# Patient Record
Sex: Female | Born: 1990 | Race: White | Hispanic: No | Marital: Single | Smoking: Former smoker
Health system: Southern US, Community
[De-identification: ages and names within clinical notes are randomized; demographics above are authoritative.]

## PROBLEM LIST (undated history)

## (undated) DIAGNOSIS — J302 Other seasonal allergic rhinitis: Secondary | ICD-10-CM

## (undated) DIAGNOSIS — F32A Depression, unspecified: Secondary | ICD-10-CM

## (undated) DIAGNOSIS — E282 Polycystic ovarian syndrome: Secondary | ICD-10-CM

## (undated) DIAGNOSIS — T8859XA Other complications of anesthesia, initial encounter: Secondary | ICD-10-CM

## (undated) DIAGNOSIS — R519 Headache, unspecified: Secondary | ICD-10-CM

## (undated) DIAGNOSIS — J45909 Unspecified asthma, uncomplicated: Secondary | ICD-10-CM

## (undated) DIAGNOSIS — M797 Fibromyalgia: Secondary | ICD-10-CM

## (undated) DIAGNOSIS — N809 Endometriosis, unspecified: Secondary | ICD-10-CM

## (undated) DIAGNOSIS — F329 Major depressive disorder, single episode, unspecified: Secondary | ICD-10-CM

## (undated) DIAGNOSIS — J3089 Other allergic rhinitis: Secondary | ICD-10-CM

## (undated) DIAGNOSIS — R112 Nausea with vomiting, unspecified: Secondary | ICD-10-CM

## (undated) DIAGNOSIS — Z9889 Other specified postprocedural states: Secondary | ICD-10-CM

## (undated) DIAGNOSIS — F419 Anxiety disorder, unspecified: Secondary | ICD-10-CM

## (undated) DIAGNOSIS — O139 Gestational [pregnancy-induced] hypertension without significant proteinuria, unspecified trimester: Secondary | ICD-10-CM

## (undated) HISTORY — DX: Gestational (pregnancy-induced) hypertension without significant proteinuria, unspecified trimester: O13.9

## (undated) HISTORY — PX: TONSILLECTOMY: SUR1361

## (undated) HISTORY — DX: Other seasonal allergic rhinitis: J30.2

## (undated) HISTORY — DX: Endometriosis, unspecified: N80.9

## (undated) HISTORY — DX: Polycystic ovarian syndrome: E28.2

## (undated) HISTORY — PX: TONSILLECTOMY AND ADENOIDECTOMY: SHX28

---
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
## 2012-02-19 DEATH — deceased

## 2015-10-01 ENCOUNTER — Emergency Department
Admission: EM | Admit: 2015-10-01 | Discharge: 2015-10-01 | Disposition: A | Discharge status: Home / Self Care | Payer: Self-pay | Source: Home / Self Care | Attending: Family Medicine | Admitting: Family Medicine

## 2015-10-01 ENCOUNTER — Encounter: Payer: Self-pay | Admitting: *Deleted

## 2015-10-01 DIAGNOSIS — B9789 Other viral agents as the cause of diseases classified elsewhere: Principal | ICD-10-CM

## 2015-10-01 DIAGNOSIS — J9801 Acute bronchospasm: Secondary | ICD-10-CM

## 2015-10-01 DIAGNOSIS — J069 Acute upper respiratory infection, unspecified: Secondary | ICD-10-CM

## 2015-10-01 HISTORY — DX: Unspecified asthma, uncomplicated: J45.909

## 2015-10-01 MED ORDER — PREDNISONE 20 MG PO TABS: 20.0000 mg | ORAL_TABLET | Freq: Two times a day (BID) | ORAL | Status: DC

## 2015-10-01 MED ORDER — AZITHROMYCIN 250 MG PO TABS: ORAL_TABLET | ORAL | Status: DC

## 2015-10-01 MED ORDER — BENZONATATE 200 MG PO CAPS: 200.0000 mg | ORAL_CAPSULE | Freq: Every day | ORAL | Status: DC

## 2015-10-01 NOTE — ED Notes (Signed)
Pt c/o 9 days of productive cough and congestion. Fever yesterday and day before. N/V last night.

## 2015-10-01 NOTE — ED Provider Notes (Signed)
CSN: [id number]     Arrival date & time 10/01/15  1144 History   First MD Initiated Contact with Patient 10/01/15 1215     Chief Complaint  Patient presents with  . Cough      HPI Comments: About nine days ago patient developed typical cold-like symptoms including mild sore throat, sinus congestion, headache, fatigue, and cough.  Her cough is generally non-productive, and worse at night.  She has occasional mild wheezing.  During the past two days she has had low grade fever/chills/sweats.  She has a history of exercise asthma.  She smokes.  The history is provided by the patient.    Past Medical History  Diagnosis Date  . Asthma    Past Surgical History  Procedure Laterality Date  . Tonsillectomy and adenoidectomy     Family History  Problem Relation Age of Onset  . Thyroid disease Mother   . Hyperlipidemia Mother   . Hyperlipidemia Father   . COPD Father    Social History  Substance Use Topics  . Smoking status: Current Every Day Smoker -- 0.25 packs/day for 2 years    Types: Cigarettes  . Smokeless tobacco: Never Used  . Alcohol Use: Yes   OB History    No data available     Review of Systems + sore throat + cough No pleuritic pain ? wheezing + nasal congestion + post-nasal drainage + sinus pain/pressure No itchy/red eyes ? earache No hemoptysis No SOB ? fever, + chills + nausea, resolved + vomiting, resolved No abdominal pain No diarrhea No urinary symptoms No skin rash + fatigue + myalgias + headache Used OTC meds without relief  Allergies  Latex  Home Medications   Prior to Admission medications   Medication Sig Start Date End Date Taking? Authorizing Provider  azithromycin (ZITHROMAX Z-PAK) 250 MG tablet Take 2 tabs today; then begin one tab once daily for 4 more days. 10/01/15   Lattie Haw, MD  benzonatate (TESSALON) 200 MG capsule Take 1 capsule (200 mg total) by mouth at bedtime. Take as needed for cough 10/01/15   Lattie Haw,  MD  predniSONE (DELTASONE) 20 MG tablet Take 1 tablet (20 mg total) by mouth 2 (two) times daily. Take with food. 10/01/15   Lattie Haw, MD   Meds Ordered and Administered this Visit  Medications - No data to display  BP 127/84 mmHg  Pulse 92  Temp(Src) 97.9 F (36.6 C) (Oral)  Resp 16  Ht  (1.778 m)  Wt 159 lb (72.122 kg)  BMI 22.81 kg/m2  SpO2 100%  LMP 09/14/2015 No data found.   Physical Exam Nursing notes and Vital Signs reviewed. Appearance:  Patient appears stated age, and in no acute distress Eyes:  Pupils are equal, round, and reactive to light and accomodation.  Extraocular movement is intact.  Conjunctivae are not inflamed  Ears:  Right canal occluded with cerumen.  Left canal partly occluded with cerumen.  Left tympanic membrane appears normal. Nose:   Congested turbinates.   Maxillary sinus tenderness is present.  Pharynx:  Normal Neck:  Supple.   Tender enlarged posterior nodes are palpated bilaterally  Lungs:  Clear to auscultation.  Breath sounds are equal.  Moving air well. Chest:  Distinct tenderness to palpation over the mid-sternum.  Heart:  Regular rate and rhythm without murmurs, rubs, or gallops.  Abdomen:  Nontender without masses or hepatosplenomegaly.  Bowel sounds are present.  No CVA or flank tenderness.  Extremities:  No edema.  No calf tenderness Skin:  No rash present.    ED Course  Procedures  none   MDM   1. Viral URI with cough   2. Bronchospasm    Begin prednisone burst, and Z-pack for atypical coverage.  Prescription written for Benzonatate [facility](Tessalon) to take at bedtime for night-time cough.  Take plain guaifenesin (1200mg  extended release tabs such as Mucinex) twice daily, with plenty of water, for cough and congestion.  May add Pseudoephedrine (30mg , one or two every 4 to 6 hours) for sinus congestion.  Get adequate rest.   May use Afrin nasal spray (or generic oxymetazoline) twice daily for about 5 days and then  discontinue.  Also recommend using saline nasal spray several times daily and saline nasal irrigation (AYR is a common brand).  Try warm salt water gargles for sore throat.  Stop all antihistamines for now, and other non-prescription cough/cold preparations.  Follow-up with family doctor if not improving about 7 to10 days.     Lattie HawStephen A Beese, MD 10/01/15 [phone]

## 2015-10-01 NOTE — Discharge Instructions (Signed)
Take plain guaifenesin (1200mg extended release tabs such as Mucinex) twice daily, with plenty of water, for cough and congestion.  May add Pseudoephedrine (30mg, one or two every 4 to 6 hours) for sinus congestion.  Get adequate rest.   °May use Afrin nasal spray (or generic oxymetazoline) twice daily for about 5 days and then discontinue.  Also recommend using saline nasal spray several times daily and saline nasal irrigation (AYR is a common brand).   °Try warm salt water gargles for sore throat.  °Stop all antihistamines for now, and other non-prescription cough/cold preparations. °Follow-up with family doctor if not improving about 7 to10 days.  °

## 2015-10-29 ENCOUNTER — Emergency Department
Admission: EM | Admit: 2015-10-29 | Discharge: 2015-10-29 | Disposition: A | Discharge status: Home / Self Care | Payer: Self-pay | Source: Home / Self Care | Attending: Family Medicine | Admitting: Family Medicine

## 2015-10-29 ENCOUNTER — Emergency Department (INDEPENDENT_AMBULATORY_CARE_PROVIDER_SITE_OTHER): Payer: Self-pay

## 2015-10-29 DIAGNOSIS — R112 Nausea with vomiting, unspecified: Secondary | ICD-10-CM

## 2015-10-29 DIAGNOSIS — R05 Cough: Secondary | ICD-10-CM

## 2015-10-29 DIAGNOSIS — J069 Acute upper respiratory infection, unspecified: Secondary | ICD-10-CM

## 2015-10-29 DIAGNOSIS — J9801 Acute bronchospasm: Secondary | ICD-10-CM

## 2015-10-29 DIAGNOSIS — J029 Acute pharyngitis, unspecified: Secondary | ICD-10-CM

## 2015-10-29 DIAGNOSIS — B9789 Other viral agents as the cause of diseases classified elsewhere: Principal | ICD-10-CM

## 2015-10-29 LAB — POCT CBC W AUTO DIFF (K'VILLE URGENT CARE)

## 2015-10-29 LAB — POCT RAPID STREP A (OFFICE): RAPID STREP A SCREEN: NEGATIVE

## 2015-10-29 LAB — POCT URINE PREGNANCY: Preg Test, Ur: NEGATIVE

## 2015-10-29 IMAGING — CR DG CHEST 2V
2 series · 2 of 2 positions shown · non-contrast
Comparison: No priors.

CLINICAL DATA: 24-year-old female with 4 day history of cough, sore
throat, chills and chest tightness. History of asthma. Smoker.

EXAM:
CHEST  2 VIEW

[chest pa]
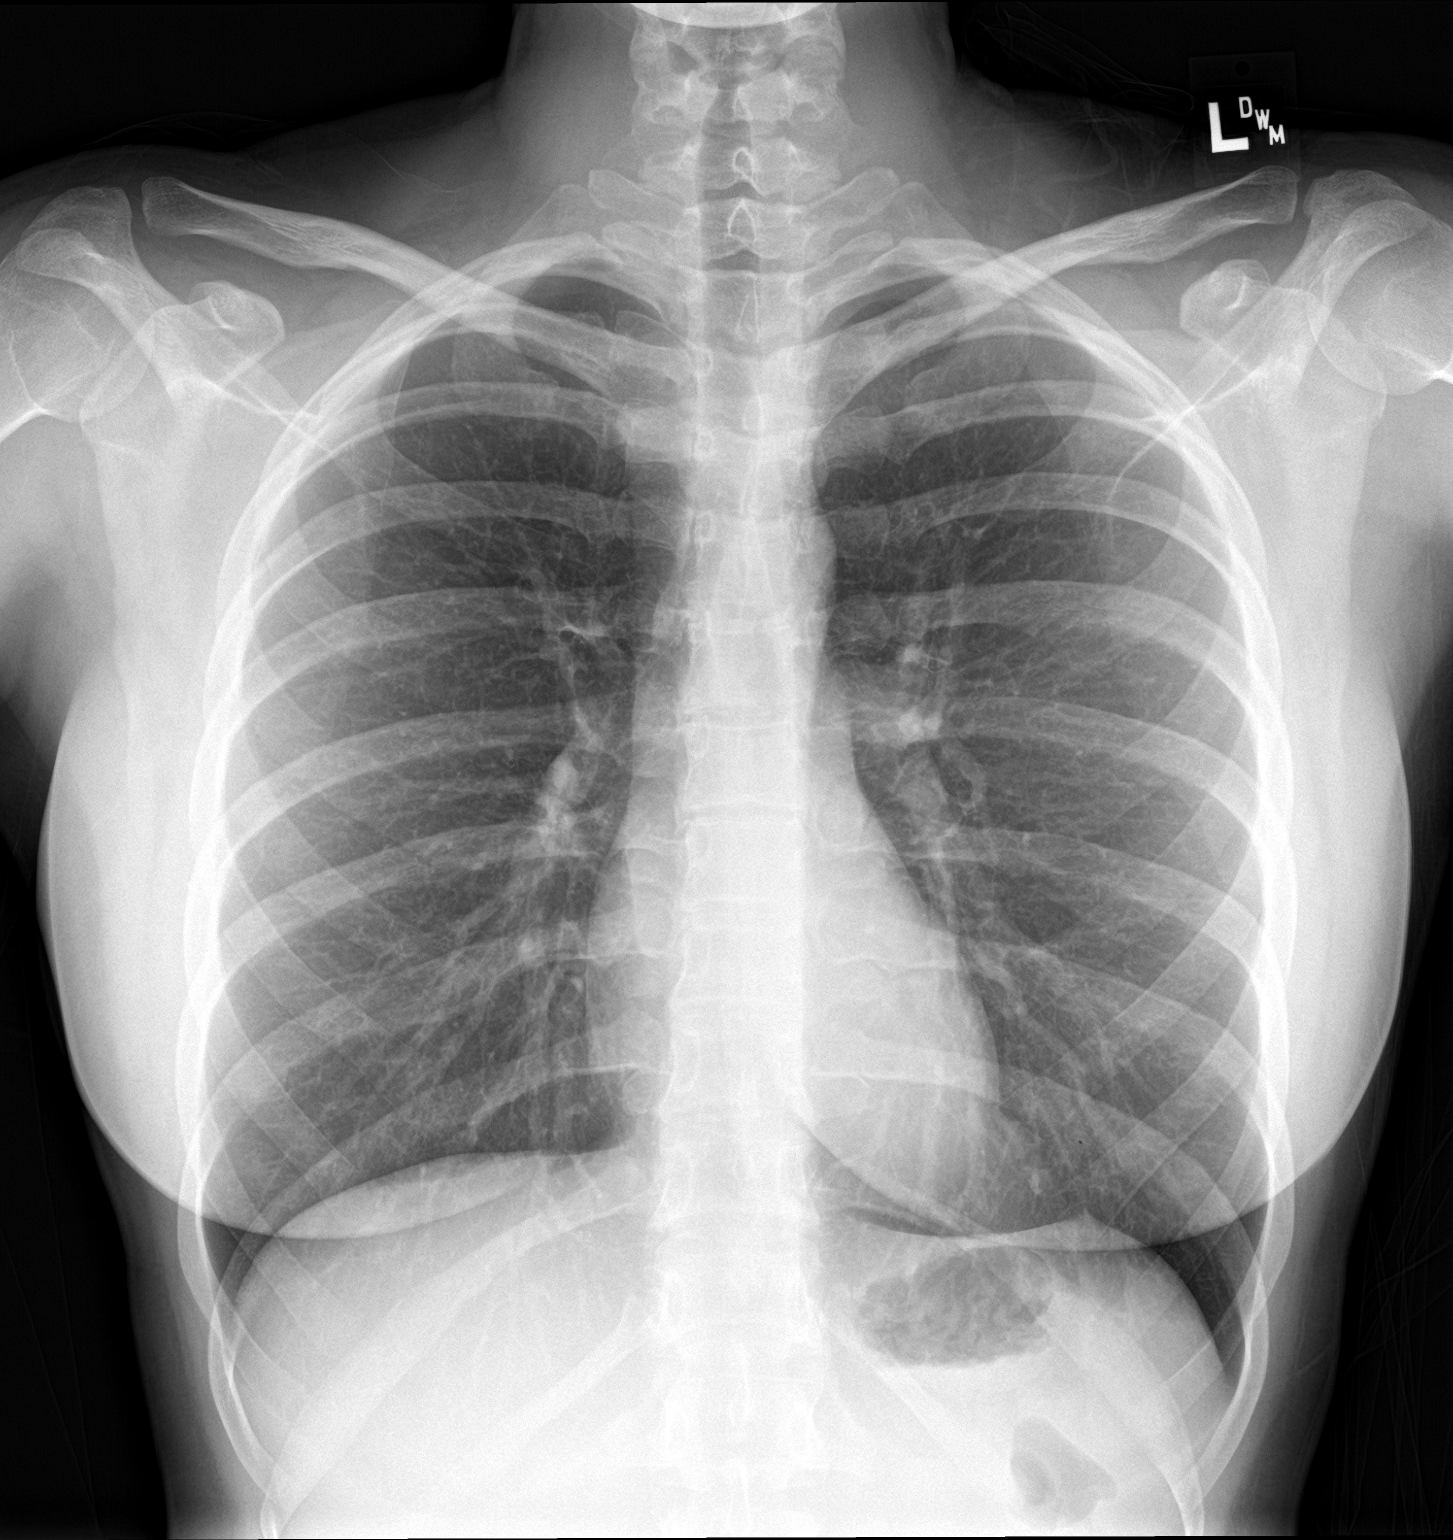

[chest lat]
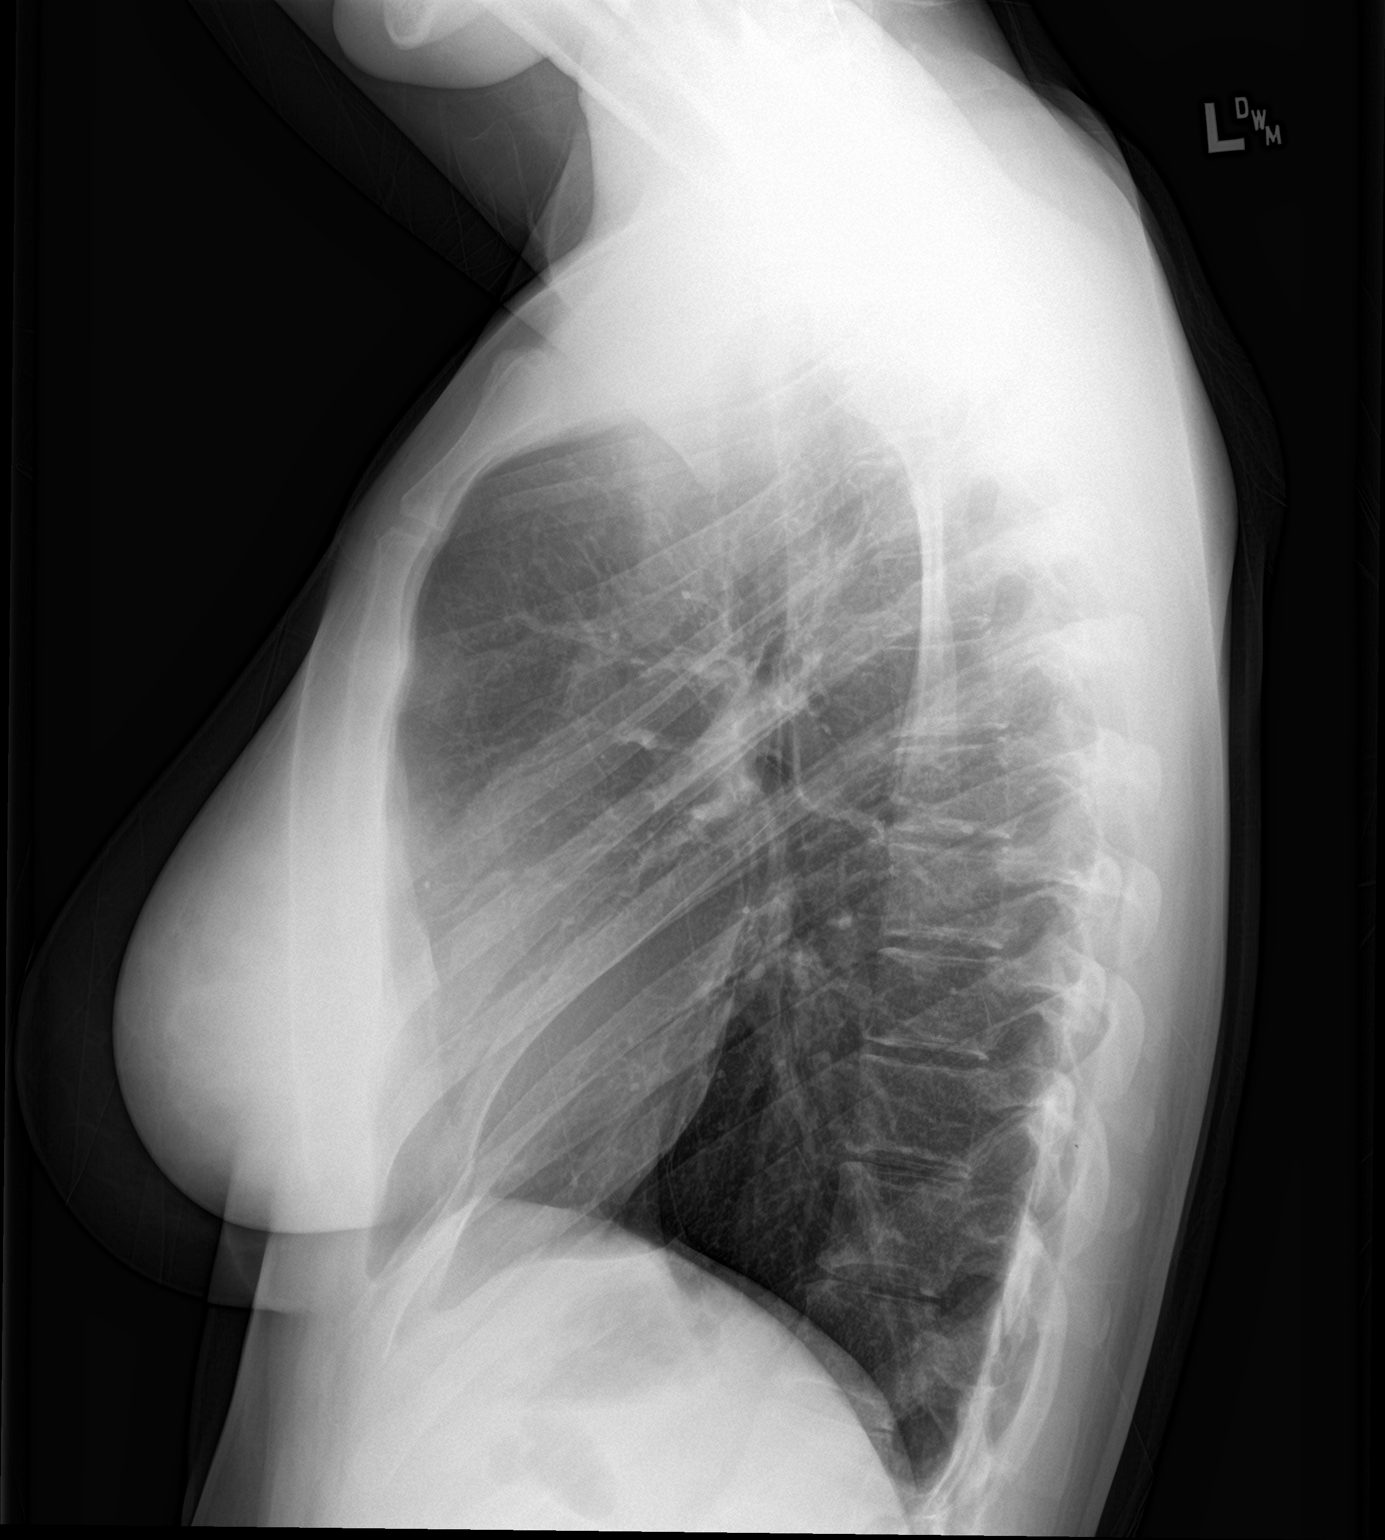

[2 of 2 positions shown; findings below may reference images not displayed]

FINDINGS: Lung volumes are increased. No consolidative airspace disease. No
pleural effusions. No pneumothorax. No pulmonary nodule or mass
noted. Pulmonary vasculature and the cardiomediastinal silhouette
are within normal limits.
IMPRESSION: 1. Increased lung volumes, suggestive of an asthma exacerbation.

## 2015-10-29 MED ORDER — GUAIFENESIN-CODEINE 100-10 MG/5ML PO SOLN
ORAL | Status: DC
Start: 2015-10-29 — End: 2017-04-02

## 2015-10-29 MED ORDER — ONDANSETRON 4 MG PO TBDP: ORAL_TABLET | ORAL | Status: DC

## 2015-10-29 MED ORDER — ONDANSETRON 4 MG PO TBDP
4.0000 mg | ORAL_TABLET | Freq: Once | ORAL | Status: AC
Start: 2015-10-29 — End: 2015-10-29
  Administered 2015-10-29: 4 mg via ORAL

## 2015-10-29 MED ORDER — FLUTICASONE-SALMETEROL 100-50 MCG/DOSE IN AEPB: 1.0000 | INHALATION_SPRAY | Freq: Two times a day (BID) | RESPIRATORY_TRACT | Status: DC

## 2015-10-29 NOTE — Discharge Instructions (Signed)
Continue plain guaifenesin (1200mg  extended release tabs such as Mucinex) twice daily, with plenty of water, for cough and congestion. Get adequate rest.   Stop all antihistamines for now, and other non-prescription cough/cold preparations. Continue albuterol inhaler as needed.   Begin clear liquids for about 12 hours, then may begin a BRAT diet (Bananas, Rice, Applesauce, Toast) when nausea and dizziness resolved.  Then gradually advance to a regular diet as tolerated.  Follow-up with family doctor if not improving about 7 to10 days.

## 2015-10-29 NOTE — ED Notes (Signed)
Here before Thanksgiving with some of the same symptoms.  Has been vomiting the last 2 days, coughing, chills, sweating, sore throat.

## 2015-10-29 NOTE — ED Provider Notes (Signed)
CSN: [id number]     Arrival date & time 10/29/15  1223 History   First MD Initiated Contact with Patient 10/29/15 1300     Chief Complaint  Patient presents with  . Fever  . Emesis      HPI Comments: Patient was treated for a respiratory infection approximately one month ago.  She improved except for persistent mild cough.  About 3 to 4 days ago she developed a scratchy throat with chills/sweats, fatigue, myalgias, and increased sinus congestion.  She has wheezed occasionally.  Yesterday and today she has had intermittent nausea/vomiting.  The history is provided by the patient.    Past Medical History  Diagnosis Date  . Asthma    Past Surgical History  Procedure Laterality Date  . Tonsillectomy and adenoidectomy     Family History  Problem Relation Age of Onset  . Thyroid disease Mother   . Hyperlipidemia Mother   . Hyperlipidemia Father   . COPD Father    Social History  Substance Use Topics  . Smoking status: Current Every Day Smoker -- 0.25 packs/day for 2 years    Types: Cigarettes  . Smokeless tobacco: Never Used  . Alcohol Use: Yes     Comment: 1 qd   OB History    No data available     Review of Systems + sore throat + cough No pleuritic pain No wheezing + nasal congestion + post-nasal drainage No sinus pain/pressure No itchy/red eyes ? Earache + dizziness No hemoptysis + SOB No fever, + chills/sweats + nausea + vomiting No abdominal pain No diarrhea No urinary symptoms No skin rash + fatigue + myalgias + headache Used OTC meds without relief  Allergies  Chicken meat (diagnostic) and Latex  Home Medications   Prior to Admission medications   Medication Sig Start Date End Date Taking? Authorizing Provider  Fluticasone-Salmeterol (ADVAIR DISKUS) 100-50 MCG/DOSE AEPB Inhale 1 puff into the lungs 2 (two) times daily. 10/29/15   Lattie HawStephen A Ellarie Picking, MD  guaiFENesin-codeine 100-10 MG/5ML syrup Take 10mL by mouth at bedtime as needed for cough  10/29/15   Lattie HawStephen A Jasim Harari, MD  ondansetron (ZOFRAN ODT) 4 MG disintegrating tablet Take one tab by mouth Q6hr prn nausea 10/29/15   Lattie HawStephen A Analeigh Aries, MD   Meds Ordered and Administered this Visit   Medications  ondansetron (ZOFRAN-ODT) disintegrating tablet 4 mg (4 mg Oral Given 10/29/15 1423)    BP 116/76 mmHg  Pulse 88  Temp(Src) 98.2 F (36.8 C) (Oral)  Ht 5\' 10"  (1.778 m)  Wt 167 lb (75.751 kg)  BMI 23.96 kg/m2  SpO2 98%  LMP 09/14/2015 No data found.   Physical Exam Nursing notes and Vital Signs reviewed. Appearance:  Patient appears stated age, and in no acute distress Eyes:  Pupils are equal, round, and reactive to light and accomodation.  Extraocular movement is intact.  Conjunctivae are not inflamed  Ears:  Canals normal.  Tympanic membranes normal.  Nose:  Congested turbinates.  No sinus tenderness.    Pharynx:  Uvula tip erythematous Neck:  Supple.   Tender enlarged posterior nodes are palpated bilaterally  Lungs:  Clear to auscultation.  Breath sounds are equal.  Moving air well. Heart:  Regular rate and rhythm without murmurs, rubs, or gallops.  Abdomen:  Nontender without masses or hepatosplenomegaly.  Bowel sounds are present.  No CVA or flank tenderness.  Extremities:  No edema.  No calf tenderness Skin:  No rash present.   ED Course  Procedures  None    Labs Reviewed  POCT RAPID STREP A (OFFICE) negative  POCT URINE PREGNANCY negative  POCT CBC W AUTO DIFF ([facility]):  WBC 6.1; LY 34.5; MO 6.6; GR 58.9; Hgb 14.0; Platelets 181     Imaging Review Dg Chest 2 View  10/29/2015  CLINICAL DATA:  24 year old female with 4 day history of cough, sore throat, chills and chest tightness. History of asthma. Smoker. EXAM: CHEST  2 VIEW COMPARISON:  No priors. FINDINGS: Lung volumes are increased. No consolidative airspace disease. No pleural effusions. No pneumothorax. No pulmonary nodule or mass noted. Pulmonary vasculature and the cardiomediastinal  silhouette are within normal limits. IMPRESSION: 1. Increased lung volumes, suggestive of an asthma exacerbation. Electronically Signed   By: Trudie Reed M.D.   On: 10/29/2015 13:43      MDM   1. Viral URI with cough   2. Bronchospasm   3. Non-intractable vomiting with nausea, unspecified vomiting type     Zofran ODT  po administered.  Rx for Zofran ODT  Rx for Robitussin AC at bedtime.   Begin Advair 100/50 Continue plain guaifenesin (  extended release tabs such as Mucinex) twice daily, with plenty of water, for cough and congestion. Get adequate rest.   Stop all antihistamines for now, and other non-prescription cough/cold preparations. Continue albuterol inhaler as needed.   Begin clear liquids for about 12 hours, then may begin a BRAT diet (Bananas, Rice, Applesauce, Toast) when nausea and dizziness resolved.  Then gradually advance to a regular diet as tolerated.  Follow-up with family doctor if not improving about 7 to10 days.   Lattie Haw, MD 11/04/15 [phone]

## 2017-04-02 ENCOUNTER — Emergency Department
Admission: EM | Admit: 2017-04-02 | Discharge: 2017-04-02 | Disposition: A | Discharge status: Home / Self Care | Payer: Self-pay | Source: Home / Self Care | Attending: Family Medicine | Admitting: Family Medicine

## 2017-04-02 ENCOUNTER — Encounter: Payer: Self-pay | Admitting: Emergency Medicine

## 2017-04-02 DIAGNOSIS — R058 Other specified cough: Secondary | ICD-10-CM

## 2017-04-02 DIAGNOSIS — B9689 Other specified bacterial agents as the cause of diseases classified elsewhere: Secondary | ICD-10-CM

## 2017-04-02 DIAGNOSIS — R05 Cough: Secondary | ICD-10-CM

## 2017-04-02 DIAGNOSIS — J329 Chronic sinusitis, unspecified: Secondary | ICD-10-CM

## 2017-04-02 MED ORDER — GUAIFENESIN-CODEINE 100-10 MG/5ML PO SYRP: 5.0000 mL | ORAL_SOLUTION | Freq: Three times a day (TID) | ORAL | 0 refills | Status: DC | PRN

## 2017-04-02 MED ORDER — AMOXICILLIN-POT CLAVULANATE 875-125 MG PO TABS: 1.0000 | ORAL_TABLET | Freq: Two times a day (BID) | ORAL | 0 refills | Status: DC

## 2017-04-02 MED ORDER — FLUTICASONE PROPIONATE 50 MCG/ACT NA SUSP: 2.0000 | Freq: Every day | NASAL | 2 refills | Status: DC

## 2017-04-02 NOTE — ED Provider Notes (Signed)
CSN: [id number]     Arrival date & time 04/02/17  [id number] History   First MD Initiated Contact with Patient 04/02/17 [phone]     Chief Complaint  Patient presents with  . Sinus Problem   (Consider location/radiation/quality/duration/timing/severity/associated sxs/prior Treatment) HPI  Tonya Hart is a 26 y.o. female presenting to UC with c/o 1 week of worsening sinus pain, congestion, and pressure with mild intermittent nose bleeds, frontal headache that is worse on Left side, and moderately intermittent productive cough that wakes her and her partner at night. She notes a coworker was just prescribed medication for potential bronchitis but no other known sick contacts.  Hx of sinus infections in the past.  Last one was about 4 months ago.  Denies fever, chills, n/v/d.    Past Medical History:  Diagnosis Date  . Asthma    Past Surgical History:  Procedure Laterality Date  . TONSILLECTOMY AND ADENOIDECTOMY     Family History  Problem Relation Age of Onset  . Thyroid disease Mother   . Hyperlipidemia Mother   . Hyperlipidemia Father   . COPD Father   . Heart attack Father    Social History  Substance Use Topics  . Smoking status: Current Every Day Smoker    Packs/day: 0.25    Years: 2.00    Types: Cigarettes  . Smokeless tobacco: Never Used  . Alcohol use Yes     Comment: 1 qd   OB History    No data available     Review of Systems  Constitutional: Negative for chills and fever.  HENT: Positive for congestion, ear pain (bilateral popping), nosebleeds, postnasal drip, sinus pain and sinus pressure. Negative for sore throat, trouble swallowing and voice change.   Respiratory: Positive for cough. Negative for shortness of breath.   Cardiovascular: Negative for chest pain and palpitations.  Gastrointestinal: Negative for abdominal pain, diarrhea, nausea and vomiting.  Musculoskeletal: Negative for arthralgias, back pain and myalgias.  Skin: Negative for rash.  Neurological:  Positive for headaches. Negative for dizziness and light-headedness.    Allergies  Chicken meat (diagnostic) and Latex  Home Medications   Prior to Admission medications   Medication Sig Start Date End Date Taking? Authorizing Provider  amoxicillin-clavulanate (AUGMENTIN) 875-125 MG tablet Take 1 tablet by mouth 2 (two) times daily. One po bid x 7 days 04/02/17   Junius Finner'Malley, Williemae Muriel, PA-C  fluticasone [facility](FLONASE) 50 MCG/ACT nasal spray Place 2 sprays into both nostrils daily. 04/02/17   Junius Finner'Malley, Tamotsu Wiederholt, PA-C  guaiFENesin-codeine [facility](ROBITUSSIN AC) 100-10 MG/5ML syrup Take 5-10 mLs by mouth 3 (three) times daily as needed for cough or congestion. 04/02/17   Junius Finner'Malley, Nikkita Adeyemi, PA-C   Meds Ordered and Administered this Visit  Medications - No data to display  BP 125/84 (BP Location: Left Arm)   Pulse 87   Temp 97.6 F (36.4 C) (Oral)   Ht 5\' 10"  (1.778 m)   Wt 206 lb (93.4 kg)   LMP 03/13/2017   SpO2 97%   BMI 29.56 kg/m  No data found.   Physical Exam  Constitutional: She is oriented to person, place, and time. She appears well-developed and well-nourished. No distress.  HENT:  Head: Normocephalic and atraumatic.  Right Ear: Tympanic membrane normal.  Left Ear: Tympanic membrane normal.  Nose: Mucosal edema present. Right sinus exhibits maxillary sinus tenderness. Right sinus exhibits no frontal sinus tenderness. Left sinus exhibits maxillary sinus tenderness. Left sinus exhibits no frontal sinus tenderness.  Mouth/Throat: Uvula is midline, oropharynx is clear  and moist and mucous membranes are normal.  Eyes: EOM are normal.  Neck: Normal range of motion. Neck supple.  Cardiovascular: Normal rate and regular rhythm.   Pulmonary/Chest: Effort normal and breath sounds normal. No stridor. No respiratory distress. She has no wheezes. She has no rales.  Musculoskeletal: Normal range of motion.  Lymphadenopathy:    She has no cervical adenopathy.  Neurological: She is alert and oriented to  person, place, and time.  Skin: Skin is warm and dry. She is not diaphoretic.  Psychiatric: She has a normal mood and affect. Her behavior is normal.  Nursing note and vitals reviewed.   Urgent Care Course     Procedures (including critical care time)  Labs Review Labs Reviewed - No data to display  Imaging Review No results found.   MDM   1. Bacterial sinusitis   2. Productive cough    Pt c/o worsening URI and sinus symptoms.  Sinus tenderness on exam.  Will cover for bacterial cause. Pt also requesting cough medication to help at night.  Rx: Augmentin, Flonase, and Virtussin  Home care instructions provided. f/u with PCP in 7-10 days if not improving. Patient verbalized understanding and agreement with treatment plan.     Junius Finner, PA-C 04/02/17 [phone]

## 2017-04-02 NOTE — ED Triage Notes (Signed)
Nose hurts, bleeding, sinus pain and pressure, cough, headache x 1 week.

## 2017-04-02 NOTE — Discharge Instructions (Signed)
° °  Please take antibiotics as prescribed and be sure to complete entire course even if you start to feel better to ensure infection does not come back.  Virtussin (guaifenesin-codeine) is a strong narcotic cough medication.  Only take up to 3 times daily as needed for severe cough.  Be sure to take with large glass of water.  It may cause drowsiness. Do not drive, drink alcohol or take other sedating medications such as Nyquil while taking this medication.

## 2018-01-04 DIAGNOSIS — R6889 Other general symptoms and signs: Secondary | ICD-10-CM | POA: Insufficient documentation

## 2018-01-04 DIAGNOSIS — J101 Influenza due to other identified influenza virus with other respiratory manifestations: Secondary | ICD-10-CM | POA: Insufficient documentation

## 2018-01-04 DIAGNOSIS — F17210 Nicotine dependence, cigarettes, uncomplicated: Secondary | ICD-10-CM | POA: Insufficient documentation

## 2018-01-04 DIAGNOSIS — J0110 Acute frontal sinusitis, unspecified: Secondary | ICD-10-CM | POA: Insufficient documentation

## 2019-03-12 ENCOUNTER — Emergency Department (INDEPENDENT_AMBULATORY_CARE_PROVIDER_SITE_OTHER)
Admission: EM | Admit: 2019-03-12 | Discharge: 2019-03-12 | Disposition: A | Discharge status: Home / Self Care | Payer: BLUE CROSS/BLUE SHIELD | Source: Home / Self Care

## 2019-03-12 ENCOUNTER — Emergency Department (INDEPENDENT_AMBULATORY_CARE_PROVIDER_SITE_OTHER): Payer: BLUE CROSS/BLUE SHIELD

## 2019-03-12 ENCOUNTER — Other Ambulatory Visit: Payer: Self-pay

## 2019-03-12 ENCOUNTER — Encounter: Payer: Self-pay | Admitting: Family Medicine

## 2019-03-12 DIAGNOSIS — S8001XA Contusion of right knee, initial encounter: Secondary | ICD-10-CM | POA: Diagnosis not present

## 2019-03-12 DIAGNOSIS — M25461 Effusion, right knee: Secondary | ICD-10-CM | POA: Diagnosis not present

## 2019-03-12 IMAGING — DX RIGHT KNEE 3 VIEWS
3 series · 3 of 3 positions shown · non-contrast
Comparison: None.

CLINICAL DATA: Worsening pain since falling onto the anterior
aspect of the right knee 4 days ago. Unable to flex or bear weight
normally.

EXAM:
RIGHT KNEE 3 VIEWS

[knee ap]
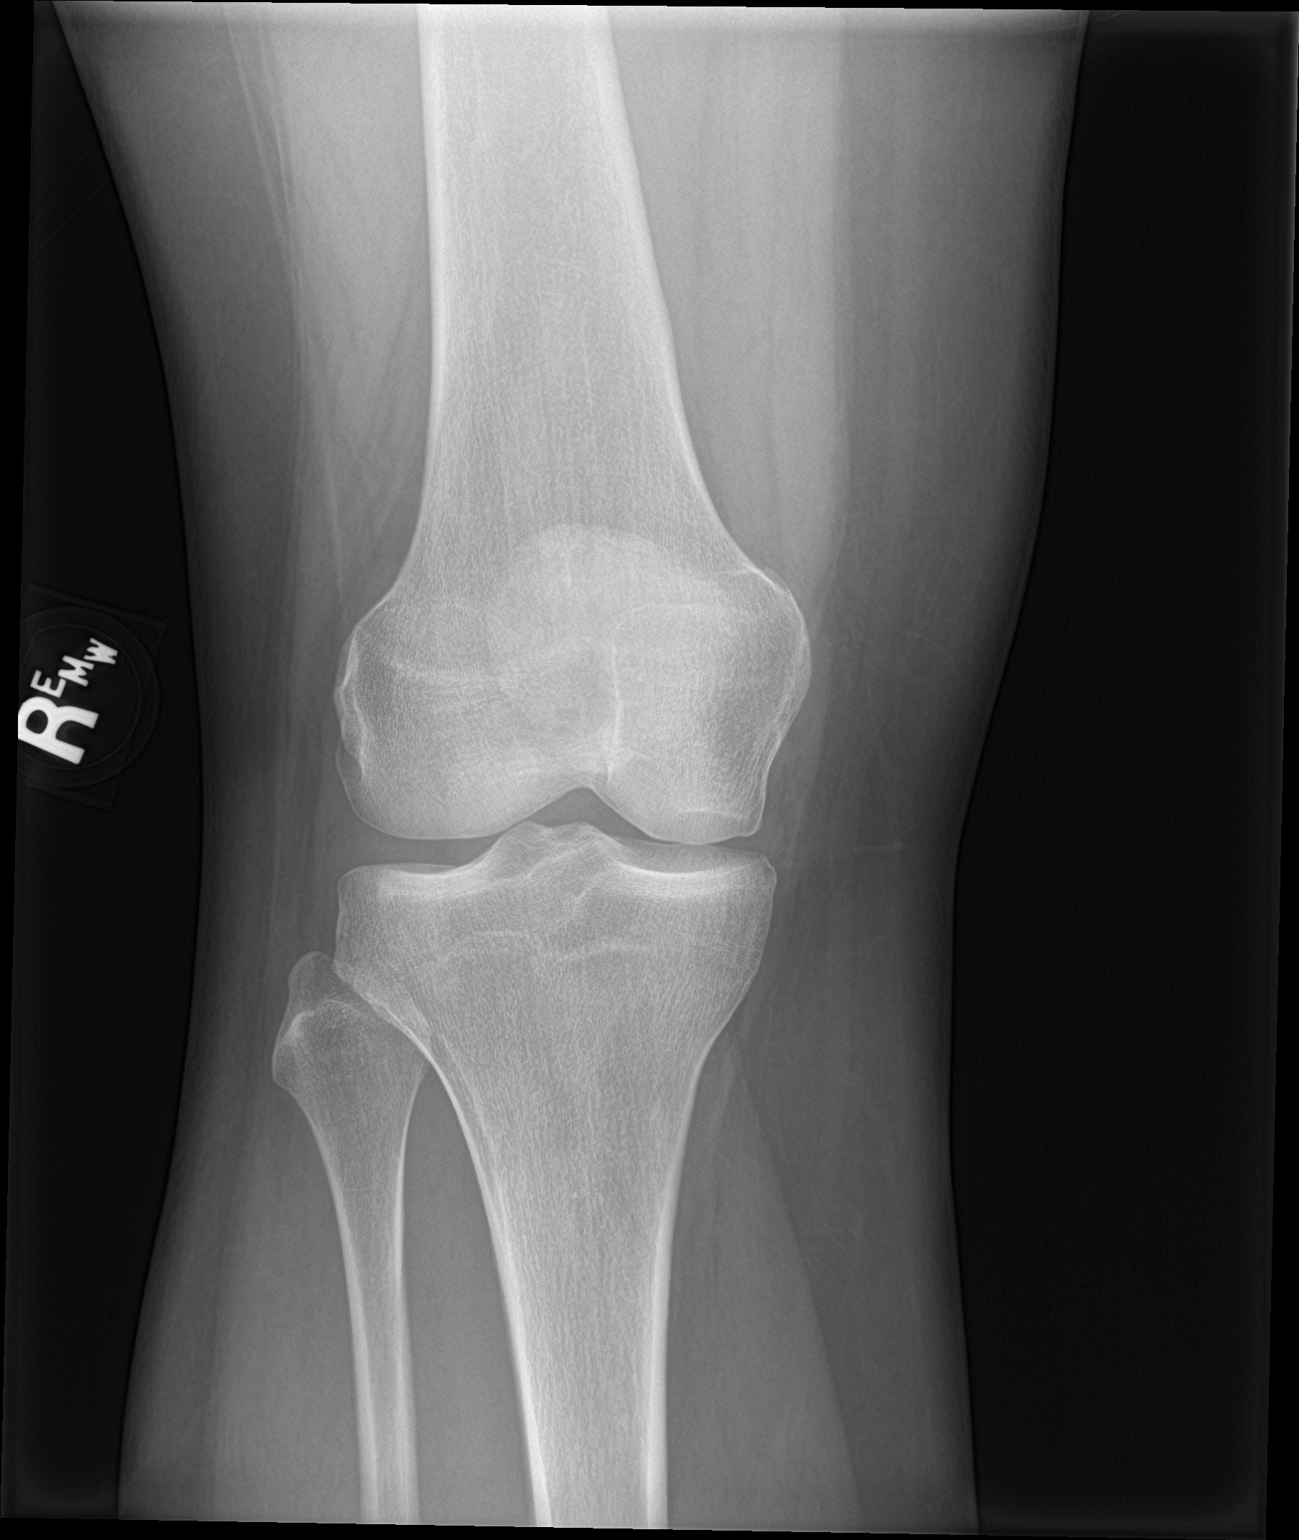

[knee lat]
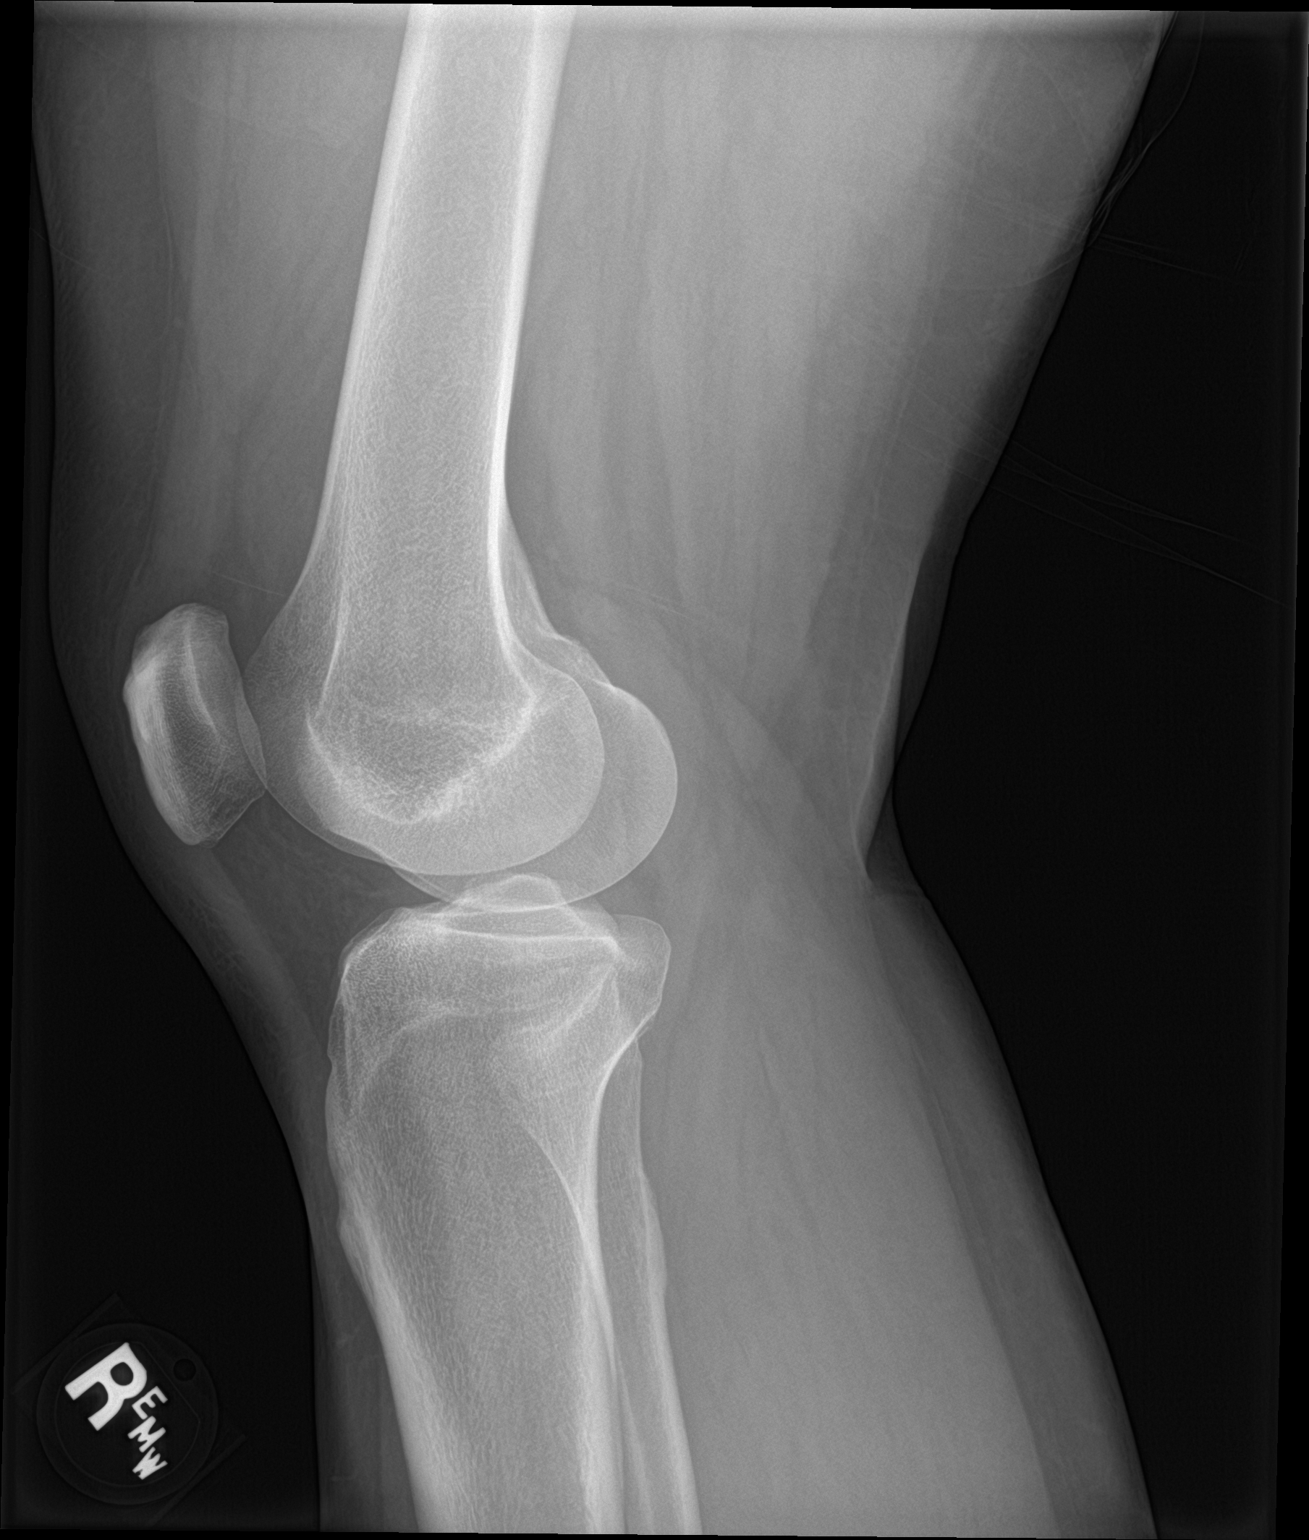

[knee sunrise]
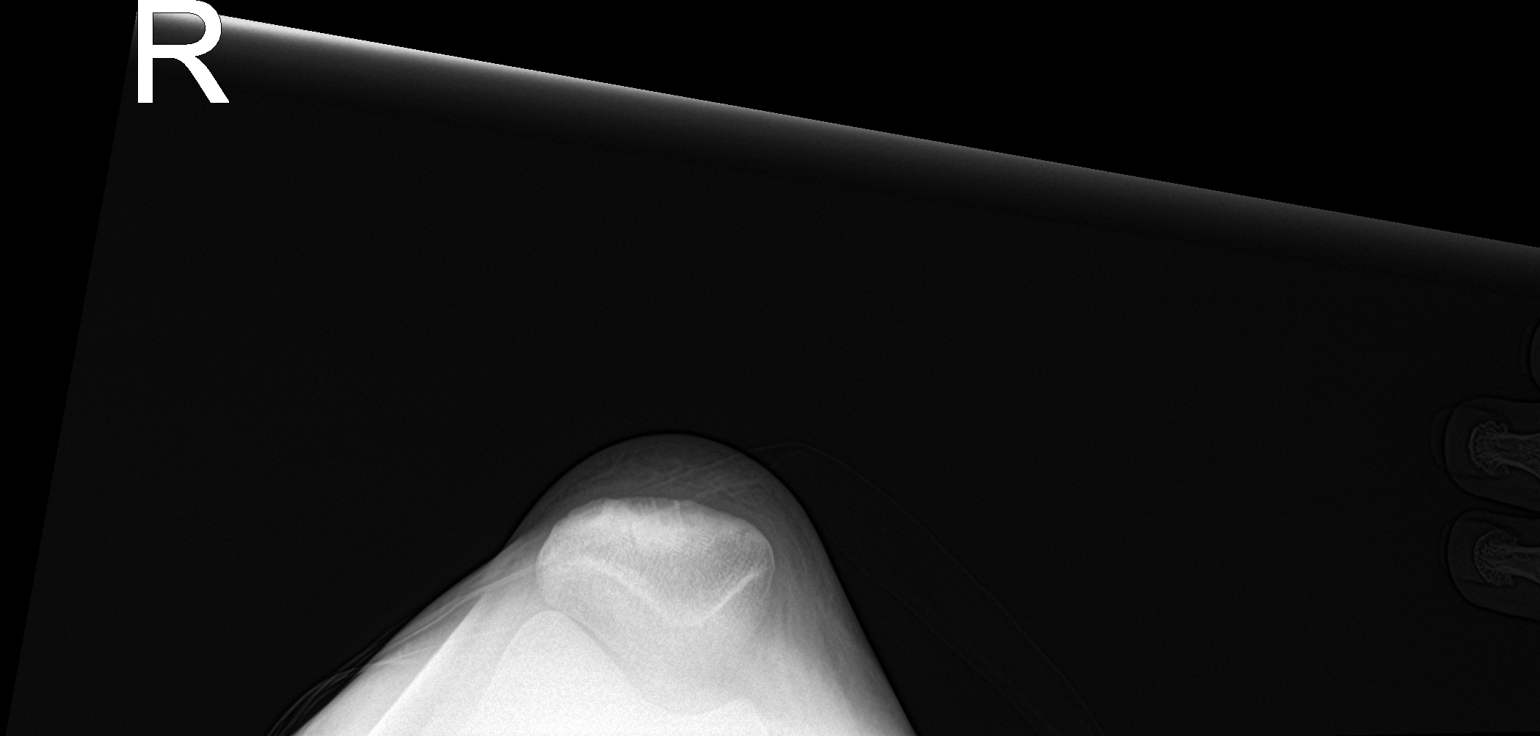

[3 of 3 positions shown; findings below may reference images not displayed]

FINDINGS: No acute fracture or dislocation is identified. There is a small
knee joint effusion. Joint space widths are preserved.
IMPRESSION: Small knee joint effusion without acute osseous abnormality
identified.

## 2019-03-12 MED ORDER — PREDNISONE 50 MG PO TABS: ORAL_TABLET | ORAL | 0 refills | Status: DC

## 2019-03-12 MED ORDER — HYDROCODONE-ACETAMINOPHEN 5-325 MG PO TABS: 1.0000 | ORAL_TABLET | Freq: Every evening | ORAL | 0 refills | Status: DC | PRN

## 2019-03-12 NOTE — ED Provider Notes (Addendum)
[facility]    CSN: [id number] Arrival date & time: 03/12/19  1301     History   Chief Complaint Chief Complaint  Patient presents with  . Knee Injury    HPI Tonya Hart is a 28 y.o. female.   This is a 28 year old established [facility] patient who presents with right knee pain.  She states that 4 days prior to arrival, she fell over a gate in the doorway to prevent the dog leaving.  She landed on her right knee and has had pain since.  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  Patient has been using a flexible knee brace but this has not afforded her much relief.  She has not taken any ibuprofen or Naprosyn.  She states that the pain is constant but particularly acute once she flexes it more than 10 degrees.  She has tenderness below and laterally to the patella.  Patient has no other significant injuries at this point.     Past Medical History:  Diagnosis Date  . Asthma     There are no active problems to display for this patient.   Past Surgical History:  Procedure Laterality Date  . TONSILLECTOMY AND ADENOIDECTOMY      OB History   No obstetric history on file.      Home Medications    Prior to Admission medications   Medication Sig Start Date End Date Taking? Authorizing Provider  HYDROcodone-acetaminophen (NORCO) 5-325 MG tablet Take 1 tablet by mouth at bedtime as needed and may repeat dose one time if needed for moderate pain. 03/12/19   Elvina Sidle, MD  predniSONE (DELTASONE) 50 MG tablet One daily with food 03/12/19   Elvina Sidle, MD    Family History Family History  Problem Relation Age of Onset  . Thyroid disease Mother   . Hyperlipidemia Mother   . Hyperlipidemia Father   . COPD Father   . Heart attack Father     Social History Social History   Tobacco Use  . Smoking status: Former Smoker    Packs/day: 0.25   Years: 2.00    Pack years: 0.50    Types: Cigarettes  . Smokeless tobacco: Never Used  Substance Use Topics  . Alcohol use: Yes    Comment: 1 qd wine  . Drug use: Yes    Types: Marijuana     Allergies   Chicken meat (diagnostic) and Latex   Review of Systems Review of Systems  Musculoskeletal: Positive for gait problem and joint swelling.  All other systems reviewed and are negative.    Physical Exam Triage Vital Signs ED Triage Vitals  Enc Vitals Group     BP      Pulse      Resp      Temp      Temp src      SpO2      Weight      Height      Head Circumference      Peak Flow      Pain Score      Pain Loc      Pain Edu?      Excl. in GC?    No data found.  Updated Vital Signs BP 115/79 (BP Location: Right Arm)   Pulse 89   Temp 98.2 F (36.8 C) (Oral)   Resp 18   LMP 03/12/2019  SpO2 98%    Physical Exam Vitals signs and nursing note reviewed.  Constitutional:      Appearance: Normal appearance. She is normal weight.  HENT:     Head: Normocephalic.  Eyes:     Conjunctiva/sclera: Conjunctivae normal.  Neck:     Musculoskeletal: Normal range of motion and neck supple.  Cardiovascular:     Rate and Rhythm: Normal rate.  Pulmonary:     Effort: Pulmonary effort is normal.  Musculoskeletal:        General: Tenderness and signs of injury present. No swelling or deformity.     Comments: Right knee exam: Ecchymosis over the inferior patellar tendon with no skin break.  Patient is tender to the lateral patella and infrapatellar tendon.  Patient is unable to flex more than 10 degrees without pain.  There is no effusion noted.  Medial and lateral collateral ligaments are nontender.  Skin:    General: Skin is warm and dry.  Neurological:     General: No focal deficit present.     Mental Status: She is alert and oriented to person, place, and time.     Gait: Gait abnormal.  Psychiatric:        Mood and Affect: Mood normal.      UC Treatments /  Results  Labs (all labs ordered are listed, but only abnormal results are displayed) Labs Reviewed - No data to display  EKG None  Radiology Dg Knee Ap/lat W/sunrise Right  Result Date: 03/12/2019 CLINICAL DATA:  Worsening pain since falling onto the anterior aspect of the right knee 4 days ago. Unable to flex or bear weight normally. EXAM: RIGHT KNEE 3 VIEWS COMPARISON:  None. FINDINGS: No acute fracture or dislocation is identified. There is a small knee joint effusion. Joint space widths are preserved. IMPRESSION: Small knee joint effusion without acute osseous abnormality identified. Electronically Signed   By: Sebastian AcheAllen  Hart M.D.   On: 03/12/2019 13:40   Right knee with sunrise:  No fracture seen  Procedures Procedures (including critical care time)  Medications Ordered in UC Medications - No data to display  Initial Impression / Assessment and Plan / UC Course  I have reviewed the triage vital signs and the nursing notes.  Pertinent labs & imaging results that were available during my care of the patient were reviewed by me and considered in my medical decision making (see chart for details).     Final Clinical Impressions(s) / UC Diagnoses   Final diagnoses:  None     Discharge Instructions     Wear the knee immobilizer all the time for the next few days, removing it to drive or shower.  Gently try flexing a little more each day.  If the pain is not improving by Monday, please return.    ED Prescriptions    Medication Sig Dispense Auth. Provider   predniSONE (DELTASONE) 50 MG tablet One daily with food 5 tablet Elvina SidleLauenstein, Sonia Stickels, MD   HYDROcodone-acetaminophen (NORCO) 5-325 MG tablet Take 1 tablet by mouth at bedtime as needed and may repeat dose one time if needed for moderate pain. 12 tablet Elvina SidleLauenstein, Catcher Dehoyos, MD     Controlled Substance Prescriptions [place] Controlled Substance Registry consulted? Not Applicable   Elvina SidleLauenstein, Dimetri Armitage, MD 03/12/19 1348     Elvina SidleLauenstein, Currie Dennin, MD 03/12/19 1348

## 2019-03-12 NOTE — Discharge Instructions (Signed)
Wear the knee immobilizer all the time for the next few days, removing it to drive or shower.  Gently try flexing a little more each day.  If the pain is not improving by Monday, please return.

## 2019-03-12 NOTE — ED Triage Notes (Signed)
Pt c/o RT knee pain post fall 4 days ago. No OTC meds. Reports hx of hyperextending it, but no surgeries.

## 2019-04-08 ENCOUNTER — Other Ambulatory Visit: Payer: Self-pay

## 2019-04-08 ENCOUNTER — Emergency Department (INDEPENDENT_AMBULATORY_CARE_PROVIDER_SITE_OTHER)
Admission: EM | Admit: 2019-04-08 | Discharge: 2019-04-08 | Disposition: A | Discharge status: Home / Self Care | Payer: BLUE CROSS/BLUE SHIELD | Source: Home / Self Care

## 2019-04-08 DIAGNOSIS — R5383 Other fatigue: Secondary | ICD-10-CM

## 2019-04-08 DIAGNOSIS — R112 Nausea with vomiting, unspecified: Secondary | ICD-10-CM | POA: Diagnosis not present

## 2019-04-08 DIAGNOSIS — R6883 Chills (without fever): Secondary | ICD-10-CM

## 2019-04-08 DIAGNOSIS — R197 Diarrhea, unspecified: Secondary | ICD-10-CM

## 2019-04-08 DIAGNOSIS — F439 Reaction to severe stress, unspecified: Secondary | ICD-10-CM

## 2019-04-08 LAB — POCT URINALYSIS DIP (MANUAL ENTRY)
Bilirubin, UA: NEGATIVE
Glucose, UA: NEGATIVE mg/dL
Ketones, POC UA: NEGATIVE mg/dL
Leukocytes, UA: NEGATIVE
Nitrite, UA: NEGATIVE
Protein Ur, POC: NEGATIVE mg/dL
Spec Grav, UA: >=1.03 — AB (ref 1.010–1.025)
Urobilinogen, UA: 0.2 E.U./dL
pH, UA: 5.5 (ref 5.0–8.0)

## 2019-04-08 LAB — COMPLETE METABOLIC PANEL WITH GFR
AG Ratio: 2 (calc) (ref 1.0–2.5)
ALT: 13 U/L (ref 6–29)
AST: 13 U/L (ref 10–30)
Albumin: 4.5 g/dL (ref 3.6–5.1)
Alkaline phosphatase (APISO): 60 U/L (ref 31–125)
BUN: 8 mg/dL (ref 7–25)
CO2: 24 mmol/L (ref 20–32)
Calcium: 9.3 mg/dL (ref 8.6–10.2)
Chloride: 108 mmol/L (ref 98–110)
Creat: 0.7 mg/dL (ref 0.50–1.10)
GFR, Est African American: 140 mL/min/{1.73_m2} (ref >=60)
GFR, Est Non African American: 120 mL/min/{1.73_m2} (ref >=60)
Globulin: 2.3 g/dL (calc) (ref 1.9–3.7)
Glucose, Bld: 91 mg/dL (ref 65–99)
Potassium: 3.9 mmol/L (ref 3.5–5.3)
Sodium: 140 mmol/L (ref 135–146)
Total Bilirubin: 0.4 mg/dL (ref 0.2–1.2)
Total Protein: 6.8 g/dL (ref 6.1–8.1)

## 2019-04-08 LAB — POCT CBC W AUTO DIFF (K'VILLE URGENT CARE)

## 2019-04-08 LAB — POCT INFLUENZA A/B
Influenza A, POC: NEGATIVE
Influenza B, POC: NEGATIVE

## 2019-04-08 LAB — POCT FASTING CBG KUC MANUAL ENTRY: POCT Glucose (KUC): 89 mg/dL (ref 70–99)

## 2019-04-08 MED ORDER — ONDANSETRON 4 MG PO TBDP: 4.0000 mg | ORAL_TABLET | Freq: Four times a day (QID) | ORAL | 0 refills | Status: DC | PRN

## 2019-04-08 MED ORDER — ONDANSETRON 4 MG PO TBDP
4.0000 mg | ORAL_TABLET | Freq: Once | ORAL | Status: AC
  Administered 2019-04-08: 13:00:00 4 mg via ORAL

## 2019-04-08 NOTE — Discharge Instructions (Signed)
°  Be sure to get a lot of rest and stay well hydrated with sports drinks, water, diluted juices, and clear sodas.  Avoid fried fatty food, spicy food, and milk as these foods can cause worsening stomach upset.   Please call to schedule a follow up appointment with your primary care provider in 2-3 days if not improving.  Call 911 or go to the hospital if symptoms worsening- severe abdominal pain, unable to keep down fluids, passing out, or other new concerning symptoms develop.

## 2019-04-08 NOTE — ED Provider Notes (Signed)
[facility]    CSN: [id number] Arrival date & time: 04/08/19  1209     History   Chief Complaint Chief Complaint  Patient presents with  . Nausea  . Fatigue    HPI Tonya Hart is a 28 y.o. female.   HPI  Tonya Hart is a 28 y.o. female presenting to UC with concern she may have the flu. She reports having no energy and feeling "drained" for the last 3 days.  She reports vomiting once yesterday and twice today after "chugging some water" she has not had anything to eat today due to loss of appetite. Mild to moderate abdominal cramping but she notes she just started her menstrual cycle today.  She does not she has been under stress and is concerned her symptoms may be from her stress and anxiety as it runs in her family.  Denies cough, congestion, or SOB. Denies body aches or fever. She has had chills. She had 1 episode of loose stool yesterday she believed was due to eating some greasy food.  Denies sick contacts or recent travel. Denies concern for pregnancy as she is on her menstrual cycle now.    Past Medical History:  Diagnosis Date  . Asthma     There are no active problems to display for this patient.   Past Surgical History:  Procedure Laterality Date  . TONSILLECTOMY AND ADENOIDECTOMY      OB History   No obstetric history on file.      Home Medications    Prior to Admission medications   Medication Sig Start Date End Date Taking? Authorizing Provider  HYDROcodone-acetaminophen (NORCO) 5-325 MG tablet Take 1 tablet by mouth at bedtime as needed and may repeat dose one time if needed for moderate pain. 03/12/19   Elvina SidleLauenstein, Kurt, MD  ondansetron (ZOFRAN ODT) 4 MG disintegrating tablet Take 1 tablet (4 mg total) by mouth every 6 (six) hours as needed. 04/08/19   Lurene ShadowPhelps, Annelyse Rey O, PA-C  predniSONE (DELTASONE) 50 MG tablet One daily with food 03/12/19   Elvina SidleLauenstein, Kurt, MD    Family History Family History  Problem Relation Age of Onset  . Thyroid  disease Mother   . Hyperlipidemia Mother   . Hyperlipidemia Father   . COPD Father   . Heart attack Father     Social History Social History   Tobacco Use  . Smoking status: Former Smoker    Packs/day: 0.25    Years: 2.00    Pack years: 0.50    Types: Cigarettes  . Smokeless tobacco: Never Used  Substance Use Topics  . Alcohol use: Yes    Comment: 1 qd wine  . Drug use: Yes    Types: Marijuana     Allergies   Chicken meat (diagnostic) and Latex   Review of Systems Review of Systems  Constitutional: Positive for appetite change, chills and fatigue. Negative for fever.  HENT: Negative for congestion, ear pain, sore throat, trouble swallowing and voice change.   Respiratory: Negative for cough and shortness of breath.   Cardiovascular: Negative for chest pain and palpitations.  Gastrointestinal: Positive for abdominal pain, diarrhea, nausea and vomiting.  Genitourinary: Negative for dysuria, flank pain and frequency.  Musculoskeletal: Negative for arthralgias, back pain and myalgias.  Skin: Negative for rash.  Neurological: Negative for dizziness, light-headedness and headaches.     Physical Exam Triage Vital Signs ED Triage Vitals  Enc Vitals Group     BP 04/08/19 1227 112/78  Pulse Rate 04/08/19 1227 97     Resp 04/08/19 1227 20     Temp 04/08/19 1227 (!) 97.4 F (36.3 C)     Temp Source 04/08/19 1227 Tympanic     SpO2 04/08/19 1227 99 %     Weight 04/08/19 1228 202 lb (91.6 kg)     Height 04/08/19 1228  (1.778 m)     Head Circumference --      Peak Flow --      Pain Score 04/08/19 1228 0     Pain Loc --      Pain Edu? --      Excl. in GC? --    No data found.  Updated Vital Signs BP 112/78 (BP Location: Right Arm)   Pulse 97   Temp (!) 97.4 F (36.3 C) (Tympanic)   Resp 20   Ht  (1.778 m)   Wt 202 lb (91.6 kg)   LMP 04/08/2019   SpO2 99%   BMI 28.98 kg/m   Visual Acuity Right Eye Distance:   Left Eye Distance:   Bilateral  Distance:    Right Eye Near:   Left Eye Near:    Bilateral Near:     Physical Exam Vitals signs and nursing note reviewed.  Constitutional:      Appearance: Normal appearance. She is well-developed.  HENT:     Head: Normocephalic and atraumatic.     Right Ear: Tympanic membrane normal.     Left Ear: Tympanic membrane normal.     Nose: Nose normal.     Mouth/Throat:     Lips: Pink.     Mouth: Mucous membranes are moist.     Pharynx: Oropharynx is clear. Uvula midline.  Neck:     Musculoskeletal: Normal range of motion.  Cardiovascular:     Rate and Rhythm: Normal rate and regular rhythm.  Pulmonary:     Effort: Pulmonary effort is normal.     Breath sounds: Normal breath sounds.  Abdominal:     General: There is no distension.     Palpations: Abdomen is soft.     Tenderness: There is no abdominal tenderness. There is no right CVA tenderness or left CVA tenderness.  Musculoskeletal: Normal range of motion.  Skin:    General: Skin is warm and dry.     Capillary Refill: Capillary refill takes less than 2 seconds.  Neurological:     Mental Status: She is alert and oriented to person, place, and time.  Psychiatric:        Behavior: Behavior normal.      UC Treatments / Results  Labs (all labs ordered are listed, but only abnormal results are displayed) Labs Reviewed  POCT URINALYSIS DIP (MANUAL ENTRY) - Abnormal; Notable for the following components:      Result Value   Spec Grav, UA >=1.030 (*)    Blood, UA moderate (*)    All other components within normal limits  COMPLETE METABOLIC PANEL WITH GFR  POCT CBC W AUTO DIFF ([facility])  POCT INFLUENZA A/B  POCT FASTING CBG [facility] MANUAL ENTRY    EKG None  Radiology No results found.  Procedures Procedures (including critical care time)  Medications Ordered in UC Medications  ondansetron (ZOFRAN-ODT) disintegrating tablet 4 mg (4 mg Oral Given 04/08/19 1258)    Initial Impression / Assessment and  Plan / UC Course  I have reviewed the triage vital signs and the nursing notes.  Pertinent labs & imaging  results that were available during my care of the patient were reviewed by me and considered in my medical decision making (see chart for details).     Benign abdominal Reassured pt of negative flu test Symptoms possibly from something bad she ate, stress, or a mild viral illness Home care info provided Encouraged f/u with PCP in 2-3 days if not improving Discussed symptoms that warrant emergent care in the ED.  Final Clinical Impressions(s) / UC Diagnoses   Final diagnoses:  Nausea vomiting and diarrhea  Chills (without fever)  Stress  Other fatigue     Discharge Instructions      Be sure to get a lot of rest and stay well hydrated with sports drinks, water, diluted juices, and clear sodas.  Avoid fried fatty food, spicy food, and milk as these foods can cause worsening stomach upset.   Please call to schedule a follow up appointment with your primary care provider in 2-3 days if not improving.  Call 911 or go to the hospital if symptoms worsening- severe abdominal pain, unable to keep down fluids, passing out, or other new concerning symptoms develop.    ED Prescriptions    Medication Sig Dispense Auth. Provider   ondansetron (ZOFRAN ODT) 4 MG disintegrating tablet Take 1 tablet (4 mg total) by mouth every 6 (six) hours as needed. 8 tablet Lurene Shadow, PA-C     Controlled Substance Prescriptions [place] Controlled Substance Registry consulted? Not Applicable   Rolla Plate 04/08/19 1356

## 2019-04-08 NOTE — ED Triage Notes (Signed)
Pt has had no energy, and felt "drained" for the last 3 days.  Vomited twice this morning at 3 am and 7 am "after chugging water".

## 2019-04-09 ENCOUNTER — Telehealth: Payer: Self-pay

## 2019-04-09 NOTE — Telephone Encounter (Signed)
Left message with normal lab results and contact information for any questions or concerns.

## 2020-03-04 ENCOUNTER — Encounter: Payer: Self-pay | Admitting: Emergency Medicine

## 2020-03-04 ENCOUNTER — Emergency Department (INDEPENDENT_AMBULATORY_CARE_PROVIDER_SITE_OTHER)
Admission: EM | Admit: 2020-03-04 | Discharge: 2020-03-04 | Disposition: A | Discharge status: Home / Self Care | Payer: Managed Care, Other (non HMO) | Source: Home / Self Care | Attending: Family Medicine | Admitting: Family Medicine

## 2020-03-04 ENCOUNTER — Other Ambulatory Visit: Payer: Self-pay

## 2020-03-04 DIAGNOSIS — L259 Unspecified contact dermatitis, unspecified cause: Secondary | ICD-10-CM | POA: Diagnosis not present

## 2020-03-04 HISTORY — DX: Other allergic rhinitis: J30.89

## 2020-03-04 HISTORY — DX: Anxiety disorder, unspecified: F41.9

## 2020-03-04 HISTORY — DX: Depression, unspecified: F32.A

## 2020-03-04 MED ORDER — METHYLPREDNISOLONE SODIUM SUCC 125 MG IJ SOLR
80.0000 mg | Freq: Once | INTRAMUSCULAR | Status: AC
  Administered 2020-03-04: 15:00:00 80 mg via INTRAMUSCULAR

## 2020-03-04 MED ORDER — DOXYCYCLINE HYCLATE 100 MG PO CAPS: 100.0000 mg | ORAL_CAPSULE | Freq: Two times a day (BID) | ORAL | 0 refills | Status: DC

## 2020-03-04 MED ORDER — PREDNISONE 20 MG PO TABS: ORAL_TABLET | ORAL | 0 refills | Status: DC

## 2020-03-04 NOTE — ED Triage Notes (Signed)
Patient started noticing rash in variety of body areas about 4 days ago; progressive worsening with itching and burning; has used benadryl and calamine lotion and other home remedies. No known exposure covid positive person.

## 2020-03-04 NOTE — Discharge Instructions (Addendum)
Begin prednisone Friday 03/05/20.  May take Benadryl at bedtime for itching. May take Tylenol 500mg , 2 tabs at bedtime for pain.

## 2020-03-04 NOTE — ED Provider Notes (Signed)
[facility]    CSN: [id number] Arrival date & time: 03/04/20  1350      History   Chief Complaint Chief Complaint  Patient presents with  . Rash    HPI Tonya Hart is a 29 y.o. female.   Patient reports that she has been helping her parents remove brush and weeds from new property.  Five days ago she noticed a pruritic burning rash on her extremities.   Rash Location: extremitiees. Quality: burning, dryness, itchiness and redness   Quality: not blistering, not bruising, not draining, not painful, not peeling, not scaling, not swelling and not weeping   Severity:  Mild Onset quality:  Gradual Duration:  5 days Timing:  Constant Progression:  Worsening Chronicity:  New Context: plant contact   Context: not animal contact, not chemical exposure, not food, not hot tub use, not insect bite/sting, not medications and not new detergent/soap   Relieved by:  Nothing Worsened by:  Nothing Ineffective treatments:  Antihistamines (calamine lotion) Associated symptoms: no abdominal pain, no diarrhea, no fatigue, no fever, no induration, no joint pain and no myalgias     Past Medical History:  Diagnosis Date  . Anxiety and depression    no current medications  . Asthma   . Environmental and seasonal allergies     There are no problems to display for this patient.   Past Surgical History:  Procedure Laterality Date  . TONSILLECTOMY    . TONSILLECTOMY AND ADENOIDECTOMY      OB History   No obstetric history on file.      Home Medications    Prior to Admission medications   Medication Sig Start Date End Date Taking? Authorizing Provider  doxycycline (VIBRAMYCIN) 100 MG capsule Take 1 capsule (100 mg total) by mouth 2 (two) times daily. Take with food. 03/04/20   Kandra Nicolas, MD  predniSONE (DELTASONE) 20 MG tablet Take one tab by mouth twice daily for 4 days, then one daily for 3 days. Take with food. 03/04/20   Kandra Nicolas, MD    Family  History Family History  Problem Relation Age of Onset  . Thyroid disease Mother   . Hyperlipidemia Mother   . Hyperlipidemia Father   . COPD Father   . Heart attack Father     Social History Social History   Tobacco Use  . Smoking status: Former Smoker    Packs/day: 0.25    Years: 2.00    Pack years: 0.50    Types: Cigarettes  . Smokeless tobacco: Never Used  Substance Use Topics  . Alcohol use: Yes    Comment: 1 qd wine  . Drug use: Yes    Types: Marijuana     Allergies   Chicken meat (diagnostic) and Latex   Review of Systems Review of Systems  Constitutional: Negative for fatigue and fever.  Gastrointestinal: Negative for abdominal pain and diarrhea.  Musculoskeletal: Negative for arthralgias and myalgias.  Skin: Positive for rash.  All other systems reviewed and are negative.    Physical Exam Triage Vital Signs ED Triage Vitals  Enc Vitals Group     BP 03/04/20 1414 122/85     Pulse Rate 03/04/20 1414 (!) 122     Resp 03/04/20 1414 18     Temp 03/04/20 1414 98.5 F (36.9 C)     Temp Source 03/04/20 1414 Oral     SpO2 03/04/20 1414 99 %     Weight 03/04/20 1415 197 lb (89.4 kg)  Height 03/04/20 1415 5\' 10"  (1.778 m)     Head Circumference --      Peak Flow --      Pain Score 03/04/20 1415 2     Pain Loc --      Pain Edu? --      Excl. in GC? --    No data found.  Updated Vital Signs BP 122/85 (BP Location: Right Arm)   Pulse (!) 122   Temp 98.5 F (36.9 C) (Oral)   Resp 18   Ht 5\' 10"  (1.778 m)   Wt 89.4 kg   LMP 02/15/2020   SpO2 99%   BMI 28.27 kg/m   Visual Acuity Right Eye Distance:   Left Eye Distance:   Bilateral Distance:    Right Eye Near:   Left Eye Near:    Bilateral Near:     Physical Exam Vitals and nursing note reviewed.  Constitutional:      General: She is not in acute distress. HENT:     Head: Normocephalic.     Mouth/Throat:     Pharynx: Oropharynx is clear.  Cardiovascular:     Rate and Rhythm:  Normal rate.  Pulmonary:     Effort: Pulmonary effort is normal.  Musculoskeletal:       Feet:     Comments: Right anterior ankle has 1cm diameter raised erythematous lesion, not indurated or fluctuant but tender to palpation.  Surrounding erythema is present.  Skin:    General: Skin is warm and dry.     Findings: Rash is not crusting, macular, pustular or vesicular.          Comments: Forearms and legs have sparsley scattered erythematous slightly raised macules without induration, warmth, or tenderness to palpation.  Neurological:     Mental Status: She is alert.      UC Treatments / Results  Labs (all labs ordered are listed, but only abnormal results are displayed) Labs Reviewed - No data to display  EKG   Radiology No results found.  Procedures Procedures (including critical care time)  Medications Ordered in UC Medications  methylPREDNISolone sodium succinate (SOLU-MEDROL) 125 mg/2 mL injection 80 mg (has no administration in time range)    Initial Impression / Assessment and Plan / UC Course  I have reviewed the triage vital signs and the nursing notes.  Pertinent labs & imaging results that were available during my care of the patient were reviewed by me and considered in my medical decision making (see chart for details).    Administered Solumedrol 80mg  IM, then begin prednisone burst/taper. Suspect secondary bacterial cellulitis; begin doxycycline. Followup with dermatologist if not improved one week.   Final Clinical Impressions(s) / UC Diagnoses   Final diagnoses:  Contact dermatitis, unspecified contact dermatitis type, unspecified trigger     Discharge Instructions     Begin prednisone Friday 03/05/20.  May take Benadryl at bedtime for itching. May take Tylenol 500mg , 2 tabs at bedtime for pain.    ED Prescriptions    Medication Sig Dispense Auth. Provider   predniSONE (DELTASONE) 20 MG tablet Take one tab by mouth twice daily for 4 days,  then one daily for 3 days. Take with food. 11 tablet , MD   doxycycline (VIBRAMYCIN) 100 MG capsule Take 1 capsule (100 mg total) by mouth 2 (two) times daily. Take with food. 14 capsule Tuesday, MD        03/07/20, MD 03/08/20 [phone]

## 2020-03-16 ENCOUNTER — Ambulatory Visit (INDEPENDENT_AMBULATORY_CARE_PROVIDER_SITE_OTHER): Payer: Managed Care, Other (non HMO) | Admitting: Medical-Surgical

## 2020-03-16 ENCOUNTER — Encounter: Payer: Self-pay | Admitting: Medical-Surgical

## 2020-03-16 ENCOUNTER — Other Ambulatory Visit: Payer: Self-pay

## 2020-03-16 VITALS — BP 126/85 | HR 98 | Temp 98.9°F | Ht 68.25 in | Wt 198.9 lb

## 2020-03-16 DIAGNOSIS — F418 Other specified anxiety disorders: Secondary | ICD-10-CM

## 2020-03-16 DIAGNOSIS — L309 Dermatitis, unspecified: Secondary | ICD-10-CM

## 2020-03-16 DIAGNOSIS — Z7689 Persons encountering health services in other specified circumstances: Secondary | ICD-10-CM | POA: Diagnosis not present

## 2020-03-16 DIAGNOSIS — F411 Generalized anxiety disorder: Secondary | ICD-10-CM | POA: Diagnosis not present

## 2020-03-16 MED ORDER — PREDNISONE 10 MG (48) PO TBPK: ORAL_TABLET | Freq: Every day | ORAL | 0 refills | Status: DC

## 2020-03-16 MED ORDER — CLOTRIMAZOLE-BETAMETHASONE 1-0.05 % EX CREA: 1.0000 "application " | TOPICAL_CREAM | Freq: Two times a day (BID) | CUTANEOUS | 0 refills | Status: DC

## 2020-03-16 MED ORDER — ESCITALOPRAM OXALATE 10 MG PO TABS: ORAL_TABLET | ORAL | 1 refills | Status: DC

## 2020-03-16 MED ORDER — BUSPIRONE HCL 5 MG PO TABS: 5.0000 mg | ORAL_TABLET | Freq: Three times a day (TID) | ORAL | 0 refills | Status: DC | PRN

## 2020-03-16 NOTE — Progress Notes (Signed)
New Patient Office Visit  Subjective:  Patient ID: Tonya Hart, female    DOB: 12-05-1990  Age: 29 y.o. MRN: [id number]  CC:  Chief Complaint  Patient presents with  . Establish Care  . Dermatitis    on legs and stomach, is getting worse again  . Depression  . Anxiety    HPI Tonya Hart presents to establish care. No prior PCP for approximately 7 years.   Dermatitis- recently treated for contact dermatitis at UC with doxycycline and a short course of prednisone. Reports good improvement in the rash but since stopping the prednisone, it has started to come back. Very pruritic, pt endorses waking up at night scratching. Also using Benadryl and Calamine lotion with minimal relief.  Anxiety/depression-history of anxiety with depression since early teenage years.  Reports panic attacks that occur with little provocation, symptoms include racing heart, diaphoresis, tremors.  Never been on medication, reports she learned to manage her symptoms in a very young age because it was frowned upon to express her feelings.  Tried counseling once at 62 but she admits that she did not give it a fair chance.  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  Tends to bottle up her emotions until she has random outbursts.  Symptoms are interfering in her relationship with her fianc as well as her ability to work.  Currently interested in starting medications to help but wants to think about restarting counseling before making that decision.  Became very tearful and anxious during appointment as we began to talk about anxiety and depression.  Past Medical History:  Diagnosis Date  . Anxiety and depression    no current medications  . Asthma   . Environmental and seasonal allergies     Past Surgical History:  Procedure Laterality Date  . TONSILLECTOMY    . TONSILLECTOMY AND ADENOIDECTOMY      Family History  Problem Relation Age of Onset  . Thyroid disease  Mother   . Hyperlipidemia Mother   . Hypertension Mother   . Cataracts Mother   . Hyperlipidemia Father   . COPD Father   . Heart attack Father   . Hypertension Father   . Diabetes Father   . Prostate cancer Maternal Grandfather     Social History   Socioeconomic History  . Marital status: Single    Spouse name: Not on file  . Number of children: Not on file  . Years of education: Not on file  . Highest education level: Not on file  Occupational History  . Occupation: ISS    Employer: [employer]  Tobacco Use  . Smoking status: Former Smoker    Types: Cigarettes  . Smokeless tobacco: Never Used  Substance and Sexual Activity  . Alcohol use: Yes    Comment: 3 drinks/week, beer or liquor  . Drug use: Yes    Frequency: 7.0 times per week    Types: Marijuana  . Sexual activity: Yes    Partners: Male    Birth control/protection: None  Other Topics Concern  . Not on file  Social History Narrative  . Not on file   Social Determinants of Health   Financial Resource Strain:   . Difficulty of Paying Living Expenses:   Food Insecurity:   . Worried About Programme researcher, broadcasting/film/video in the Last Year:   . Barista in the Last Year:   Transportation Needs:   . Lack of Transportation (  Medical):   Marland Kitchen Lack of Transportation (Non-Medical):   Physical Activity:   . Days of Exercise per Week:   . Minutes of Exercise per Session:   Stress:   . Feeling of Stress :   Social Connections:   . Frequency of Communication with Friends and Family:   . Frequency of Social Gatherings with Friends and Family:   . Attends Religious Services:   . Active Member of Clubs or Organizations:   . Attends Banker Meetings:   Marland Kitchen Marital Status:   Intimate Partner Violence:   . Fear of Current or Ex-Partner:   . Emotionally Abused:   Marland Kitchen Physically Abused:   . Sexually Abused:     ROS Review of Systems  Constitutional: Negative for chills, fatigue, fever and unexpected  weight change.  Respiratory: Negative for chest tightness and shortness of breath.   Cardiovascular: Positive for palpitations. Negative for chest pain and leg swelling.  Gastrointestinal: Positive for abdominal pain.  Genitourinary: Positive for menstrual problem.  Neurological: Positive for light-headedness.  Psychiatric/Behavioral: Positive for agitation and sleep disturbance. Negative for self-injury and suicidal ideas. The patient is nervous/anxious.     Objective:   Today's Vitals: BP 126/85   Pulse 98   Temp 98.9 F (37.2 C) (Oral)   Ht 5' 8.25" (1.734 m)   Wt 198 lb 14.4 oz (90.2 kg)   LMP 02/15/2020   SpO2 99%   BMI 30.02 kg/m   Physical Exam Vitals reviewed.  Constitutional:      General: She is not in acute distress.    Appearance: Normal appearance.  HENT:     Head: Normocephalic and atraumatic.  Cardiovascular:     Rate and Rhythm: Normal rate and regular rhythm.     Pulses: Normal pulses.     Heart sounds: Normal heart sounds. No murmur. No friction rub. No gallop.   Pulmonary:     Effort: Pulmonary effort is normal. No respiratory distress.     Breath sounds: Normal breath sounds. No wheezing.  Skin:    General: Skin is warm and dry.     Findings: Rash present.     Comments: Diaphoretic with increased anxiety  Neurological:     Mental Status: She is alert and oriented to person, place, and time.  Psychiatric:        Thought Content: Thought content normal.        Judgment: Judgment normal.     Comments: Tearful, anxious during appointment.  Vocal tremor noted with increased anxiety.     Assessment & Plan:   1. Dermatitis Due to positive response with short course of prednisone will extend treatment with a 12-day taper.  Woods lamp evaluation showed some fluorescence overly right anterior ankle lesion.  Lotrisone cream topically twice daily as needed. - predniSONE (STERAPRED UNI-PAK 48 TAB) 10 MG (48) TBPK tablet; Take by mouth daily. 12-Day taper,  po  Dispense: 48 tablet; Refill: 0 - clotrimazole-betamethasone (LOTRISONE) cream; Apply 1 application topically 2 (two) times daily.  Dispense: 45 g; Refill: 0  2. Encounter to establish care Due for annual physical exam with Pap and labs.  Advised to schedule a separate appointment to focus on this specifically.  3. Depression with anxiety Starting Lexapro.  We will start with 5 mg daily x6 days then increase to full dose of 10 mg daily. - escitalopram (LEXAPRO) 10 MG tablet; Take 1/2 tablet (5mg ) daily x 6 days then increase dose to 1 tablet (10mg ) daily.  Dispense:  90 tablet; Refill: 1  4. Anxiety state BuSpar 5 mg 3 times daily as needed for anxiety attacks. - busPIRone (BUSPAR) 5 MG tablet; Take 1 tablet (5 mg total) by mouth 3 (three) times daily as needed.  Dispense: 90 tablet; Refill: 0   Outpatient Encounter Medications as of 03/16/2020  Medication Sig  . busPIRone (BUSPAR) 5 MG tablet Take 1 tablet (5 mg total) by mouth 3 (three) times daily as needed.  . clotrimazole-betamethasone (LOTRISONE) cream Apply 1 application topically 2 (two) times daily.  Marland Kitchen doxycycline (VIBRAMYCIN) 100 MG capsule Take 1 capsule (100 mg total) by mouth 2 (two) times daily. Take with food. (Patient not taking: Reported on 03/16/2020)  . escitalopram (LEXAPRO) 10 MG tablet Take 1/2 tablet (5mg ) daily x 6 days then increase dose to 1 tablet (10mg ) daily.  . predniSONE (STERAPRED UNI-PAK 48 TAB) 10 MG (48) TBPK tablet Take by mouth daily. 12-Day taper, po  . [DISCONTINUED] predniSONE (DELTASONE) 20 MG tablet Take one tab by mouth twice daily for 4 days, then one daily for 3 days. Take with food. (Patient not taking: Reported on 03/16/2020)   No facility-administered encounter medications on file as of 03/16/2020.    Follow-up: Return for in 4-6 weeks for mood follow up; return at convenience for CPE w/pap.   Clearnce Sorrel, DNP, APRN, FNP-BC [facility]

## 2020-04-04 DIAGNOSIS — F418 Other specified anxiety disorders: Secondary | ICD-10-CM | POA: Insufficient documentation

## 2020-04-04 DIAGNOSIS — F411 Generalized anxiety disorder: Secondary | ICD-10-CM | POA: Insufficient documentation

## 2020-04-04 NOTE — Progress Notes (Signed)
HPI: Tonya Hart is a 29 y.o. female who  has a past medical history of Anxiety and depression, Asthma, and Environmental and seasonal allergies.  she presents to [facility] today, 04/05/20,  for chief complaint of: Annual physical exam  Anxiety/depression- taking Lexapro 54m daily, tolerating well without side effects. Using Buspar 5744mas needed for anxiety attacks. Not taking every day. Notes that Buspar helps but it does not fully resolve her symptoms.   Past medical, surgical, social and family history reviewed:  Patient Active Problem List   Diagnosis Date Noted  . Depression with anxiety 04/04/2020  . Anxiety state 04/04/2020  . Cigarette nicotine dependence without complication 0240/[id number]  Past Surgical History:  Procedure Laterality Date  . TONSILLECTOMY    . TONSILLECTOMY AND ADENOIDECTOMY      Social History   Tobacco Use  . Smoking status: Former Smoker    Types: Cigarettes  . Smokeless tobacco: Never Used  Substance Use Topics  . Alcohol use: Yes    Comment: 3 drinks/week, beer or liquor    Family History  Problem Relation Age of Onset  . Thyroid disease Mother   . Hyperlipidemia Mother   . Hypertension Mother   . Cataracts Mother   . Hyperlipidemia Father   . COPD Father   . Heart attack Father   . Hypertension Father   . Diabetes Father   . Prostate cancer Maternal Grandfather      Current medication list and allergy/intolerance information reviewed:    Current Outpatient Medications  Medication Sig Dispense Refill  . busPIRone (BUSPAR) 5 MG tablet Take 1 tablet (5 mg total) by mouth 3 (three) times daily as needed. 90 tablet 0  . clotrimazole-betamethasone (LOTRISONE) cream Apply 1 application topically 2 (two) times daily. 45 g 0  . escitalopram (LEXAPRO) 10 MG tablet Take 1/2 tablet (44m46mdaily x 6 days then increase dose to 1 tablet (58m19maily. (Patient taking differently: Take 10 mg by mouth daily. )  90 tablet 1   No current facility-administered medications for this visit.    Allergies  Allergen Reactions  . Chicken Meat (Diagnostic) Anaphylaxis  . Bee Venom Hives    swelling  . Latex Hives  . Soap Hives    Dove soap      Review of Systems:  Constitutional:  No  fever, no chills, No recent illness, No unintentional weight changes. No significant fatigue.   HEENT: No headache, no vision change, no hearing change, No sore throat, No  sinus pressure  Cardiac: No chest pain, No  pressure, No palpitations, No  Orthopnea  Respiratory:  No shortness of breath. No  Cough  Gastrointestinal: No  abdominal pain, No  nausea, No  vomiting,  No  blood in stool, No  diarrhea, No  constipation   Musculoskeletal: No new myalgia/arthralgia  Skin: No  Rash, No other wounds/concerning lesions  Genitourinary: No  incontinence, No  abnormal genital bleeding, No abnormal genital discharge  Hem/Onc: +  easy bruising, - easy bleeding, No  abnormal lymph node  Endocrine: No cold intolerance,  No heat intolerance. No polyuria/polydipsia/polyphagia   Neurologic: No  weakness, No  dizziness, No  slurred speech/focal weakness/facial droop  Psychiatric: No  concerns with depression, + concerns with anxiety, No sleep problems, No mood problems  Exam:  BP 137/84   Pulse 88   Temp 98.1 F (36.7 C) (Oral)   Ht 5' 8.25" (1.734 m)   Wt 205 lb 3.2  oz (93.1 kg)   SpO2 99%   BMI 30.97 kg/m   Constitutional: VS see above. General Appearance: alert, well-developed, well-nourished, NAD  Eyes: Normal lids and conjunctive, non-icteric sclera  Ears, Nose, Mouth, Throat: MMM, Normal external inspection ears/nares/mouth/lips/gums. TM obscured by large amount of brown cerumen. Bilateral ear lavage performed by Glennie Hawk, MA. After ear lavage, TM normal bilaterally. Pharynx/tonsils no erythema, no exudate. Nasal mucosa normal.   Neck: No masses, trachea midline. No thyroid enlargement. No  tenderness/mass appreciated. No lymphadenopathy  Respiratory: Normal respiratory effort. no wheeze, no rhonchi, no rales  Cardiovascular: S1/S2 normal, no murmur, no rub/gallop auscultated. RRR. No lower extremity edema. Pedal pulse II/IV bilaterally DP and PT. No carotid bruit or JVD. No abdominal aortic bruit.  Gastrointestinal: Nontender, no masses. No hepatomegaly, no splenomegaly. No hernia appreciated. Bowel sounds normal. Rectal exam deferred.   Musculoskeletal: Gait normal. No clubbing/cyanosis of digits.   Neurological: Normal balance/coordination. No tremor. No cranial nerve deficit on limited exam. Motor and sensation intact and symmetric. Cerebellar reflexes intact.   Skin: warm, dry, intact. No rash/ulcer. No concerning nevi or subq nodules on limited exam.    Psychiatric: Normal judgment/insight. Normal mood and affect. Oriented x3.   Pelvic exam: normal external genitalia, vulva, vagina, cervix, uterus and adnexa. Pap smear completed. Chaperoned by Glennie Hawk, MA.    No results found for this or any previous visit (from the past 72 hour(s)).  No results found.   ASSESSMENT/PLAN:   1. Annual physical exam Checking CBC, CMP, and Lipids.  2. Depression with anxiety Continue Lexapro 70m daily.   3. Anxiety state May increase Buspar to 182mTID as needed for anxiety attacks.  4. Encounter for Papanicolaou smear for cervical cancer screening Pap smear completed with reflex HPV testing.  5. Screening for HIV Discussed with patient who agrees to HIV screening. Will add to labs today.   Orders Placed This Encounter  Procedures  . CBC  . COMPLETE METABOLIC PANEL WITH GFR  . Lipid panel  . HIV Antibody (routine testing w rflx)    No orders of the defined types were placed in this encounter.   Patient Instructions  Preventive Care [id number] Old, Female Preventive care refers to visits with your health care provider and lifestyle choices that can  promote health and wellness. This includes:  A yearly physical exam. This may also be called an annual well check.  Regular dental visits and eye exams.  Immunizations.  Screening for certain conditions.  Healthy lifestyle choices, such as eating a healthy diet, getting regular exercise, not using drugs or products that contain nicotine and tobacco, and limiting alcohol use. What can I expect for my preventive care visit? Physical exam Your health care provider will check your:  Height and weight. This may be used to calculate body mass index (BMI), which tells if you are at a healthy weight.  Heart rate and blood pressure.  Skin for abnormal spots. Counseling Your health care provider may ask you questions about your:  Alcohol, tobacco, and drug use.  Emotional well-being.  Home and relationship well-being.  Sexual activity.  Eating habits.  Work and work enStatistician Method of birth control.  Menstrual cycle.  Pregnancy history. What immunizations do I need?  Influenza (flu) vaccine  This is recommended every year. Tetanus, diphtheria, and pertussis (Tdap) vaccine  You may need a Td booster every 10 years. Varicella (chickenpox) vaccine  You may need this if you have not been vaccinated.  Human papillomavirus (HPV) vaccine  If recommended by your health care provider, you may need three doses over 6 months. Measles, mumps, and rubella (MMR) vaccine  You may need at least one dose of MMR. You may also need a second dose. Meningococcal conjugate (MenACWY) vaccine  One dose is recommended if you are age 67-21 years and a first-year college student living in a residence hall, or if you have one of several medical conditions. You may also need additional booster doses. Pneumococcal conjugate (PCV13) vaccine  You may need this if you have certain conditions and were not previously vaccinated. Pneumococcal polysaccharide (PPSV23) vaccine  You may need one  or two doses if you smoke cigarettes or if you have certain conditions. Hepatitis A vaccine  You may need this if you have certain conditions or if you travel or work in places where you may be exposed to hepatitis A. Hepatitis B vaccine  You may need this if you have certain conditions or if you travel or work in places where you may be exposed to hepatitis B. Haemophilus influenzae type b (Hib) vaccine  You may need this if you have certain conditions. You may receive vaccines as individual doses or as more than one vaccine together in one shot (combination vaccines). Talk with your health care provider about the risks and benefits of combination vaccines. What tests do I need?  Blood tests  Lipid and cholesterol levels. These may be checked every 5 years starting at age 59.  Hepatitis C test.  Hepatitis B test. Screening  Diabetes screening. This is done by checking your blood sugar (glucose) after you have not eaten for a while (fasting).  Sexually transmitted disease (STD) testing.  BRCA-related cancer screening. This may be done if you have a family history of breast, ovarian, tubal, or peritoneal cancers.  Pelvic exam and Pap test. This may be done every 3 years starting at age 79. Starting at age 1, this may be done every 5 years if you have a Pap test in combination with an HPV test. Talk with your health care provider about your test results, treatment options, and if necessary, the need for more tests. Follow these instructions at home: Eating and drinking   Eat a diet that includes fresh fruits and vegetables, whole grains, lean protein, and low-fat dairy.  Take vitamin and mineral supplements as recommended by your health care provider.  Do not drink alcohol if: ? Your health care provider tells you not to drink. ? You are pregnant, may be pregnant, or are planning to become pregnant.  If you drink alcohol: ? Limit how much you have to 0-1 drink a day. ? Be  aware of how much alcohol is in your drink. In the [place] one drink equals one 12 oz bottle of beer (355 mL), one 5 oz glass of wine (148 mL), or one 1 oz glass of hard liquor (44 mL). Lifestyle  Take daily care of your teeth and gums.  Stay active. Exercise for at least 30 minutes on 5 or more days each week.  Do not use any products that contain nicotine or tobacco, such as cigarettes, e-cigarettes, and chewing tobacco. If you need help quitting, ask your health care provider.  If you are sexually active, practice safe sex. Use a condom or other form of birth control (contraception) in order to prevent pregnancy and STIs (sexually transmitted infections). If you plan to become pregnant, see your health care provider for a preconception visit.  What's next?  Visit your health care provider once a year for a well check visit.  Ask your health care provider how often you should have your eyes and teeth checked.  Stay up to date on all vaccines. This information is not intended to replace advice given to you by your health care provider. Make sure you discuss any questions you have with your health care provider. Document Revised: 07/18/2018 Document Reviewed: 07/18/2018 Elsevier Patient Education  [place].   Follow-up plan: Return in about 1 year (around 04/05/2021) for annual physical exam.  Clearnce Sorrel, DNP, APRN, FNP-BC [facility]

## 2020-04-04 NOTE — Patient Instructions (Signed)
Preventive Care 21-27 Years Old, Female Preventive care refers to visits with your health care provider and lifestyle choices that can promote health and wellness. This includes:  A yearly physical exam. This may also be called an annual well check.  Regular dental visits and eye exams.  Immunizations.  Screening for certain conditions.  Healthy lifestyle choices, such as eating a healthy diet, getting regular exercise, not using drugs or products that contain nicotine and tobacco, and limiting alcohol use. What can I expect for my preventive care visit? Physical exam Your health care provider will check your:  Height and weight. This may be used to calculate body mass index (BMI), which tells if you are at a healthy weight.  Heart rate and blood pressure.  Skin for abnormal spots. Counseling Your health care provider may ask you questions about your:  Alcohol, tobacco, and drug use.  Emotional well-being.  Home and relationship well-being.  Sexual activity.  Eating habits.  Work and work environment.  Method of birth control.  Menstrual cycle.  Pregnancy history. What immunizations do I need?  Influenza (flu) vaccine  This is recommended every year. Tetanus, diphtheria, and pertussis (Tdap) vaccine  You may need a Td booster every 10 years. Varicella (chickenpox) vaccine  You may need this if you have not been vaccinated. Human papillomavirus (HPV) vaccine  If recommended by your health care provider, you may need three doses over 6 months. Measles, mumps, and rubella (MMR) vaccine  You may need at least one dose of MMR. You may also need a second dose. Meningococcal conjugate (MenACWY) vaccine  One dose is recommended if you are age 19-21 years and a first-year college student living in a residence hall, or if you have one of several medical conditions. You may also need additional booster doses. Pneumococcal conjugate (PCV13) vaccine  You may need  this if you have certain conditions and were not previously vaccinated. Pneumococcal polysaccharide (PPSV23) vaccine  You may need one or two doses if you smoke cigarettes or if you have certain conditions. Hepatitis A vaccine  You may need this if you have certain conditions or if you travel or work in places where you may be exposed to hepatitis A. Hepatitis B vaccine  You may need this if you have certain conditions or if you travel or work in places where you may be exposed to hepatitis B. Haemophilus influenzae type b (Hib) vaccine  You may need this if you have certain conditions. You may receive vaccines as individual doses or as more than one vaccine together in one shot (combination vaccines). Talk with your health care provider about the risks and benefits of combination vaccines. What tests do I need?  Blood tests  Lipid and cholesterol levels. These may be checked every 5 years starting at age 20.  Hepatitis C test.  Hepatitis B test. Screening  Diabetes screening. This is done by checking your blood sugar (glucose) after you have not eaten for a while (fasting).  Sexually transmitted disease (STD) testing.  BRCA-related cancer screening. This may be done if you have a family history of breast, ovarian, tubal, or peritoneal cancers.  Pelvic exam and Pap test. This may be done every 3 years starting at age 21. Starting at age 30, this may be done every 5 years if you have a Pap test in combination with an HPV test. Talk with your health care provider about your test results, treatment options, and if necessary, the need for more tests.   Follow these instructions at home: Eating and drinking   Eat a diet that includes fresh fruits and vegetables, whole grains, lean protein, and low-fat dairy.  Take vitamin and mineral supplements as recommended by your health care provider.  Do not drink alcohol if: ? Your health care provider tells you not to drink. ? You are  pregnant, may be pregnant, or are planning to become pregnant.  If you drink alcohol: ? Limit how much you have to 0-1 drink a day. ? Be aware of how much alcohol is in your drink. In the [place] one drink equals one 12 oz bottle of beer (355 mL), one 5 oz glass of wine (148 mL), or one 1 oz glass of hard liquor (44 mL). Lifestyle  Take daily care of your teeth and gums.  Stay active. Exercise for at least 30 minutes on 5 or more days each week.  Do not use any products that contain nicotine or tobacco, such as cigarettes, e-cigarettes, and chewing tobacco. If you need help quitting, ask your health care provider.  If you are sexually active, practice safe sex. Use a condom or other form of birth control (contraception) in order to prevent pregnancy and STIs (sexually transmitted infections). If you plan to become pregnant, see your health care provider for a preconception visit. What's next?  Visit your health care provider once a year for a well check visit.  Ask your health care provider how often you should have your eyes and teeth checked.  Stay up to date on all vaccines. This information is not intended to replace advice given to you by your health care provider. Make sure you discuss any questions you have with your health care provider. Document Revised: 07/18/2018 Document Reviewed: 07/18/2018 Elsevier Patient Education  2020 [employer].

## 2020-04-05 ENCOUNTER — Other Ambulatory Visit (HOSPITAL_COMMUNITY)
Admission: RE | Admit: 2020-04-05 | Discharge: 2020-04-05 | Disposition: A | Discharge status: Home / Self Care | Payer: Managed Care, Other (non HMO) | Source: Ambulatory Visit | Attending: Medical-Surgical | Admitting: Medical-Surgical

## 2020-04-05 ENCOUNTER — Encounter: Payer: Self-pay | Admitting: Medical-Surgical

## 2020-04-05 ENCOUNTER — Ambulatory Visit (INDEPENDENT_AMBULATORY_CARE_PROVIDER_SITE_OTHER): Payer: Managed Care, Other (non HMO) | Admitting: Medical-Surgical

## 2020-04-05 VITALS — BP 137/84 | HR 88 | Temp 98.1°F | Ht 68.25 in | Wt 205.2 lb

## 2020-04-05 DIAGNOSIS — F418 Other specified anxiety disorders: Secondary | ICD-10-CM

## 2020-04-05 DIAGNOSIS — Z0000 Encounter for general adult medical examination without abnormal findings: Secondary | ICD-10-CM

## 2020-04-05 DIAGNOSIS — Z114 Encounter for screening for human immunodeficiency virus [HIV]: Secondary | ICD-10-CM

## 2020-04-05 DIAGNOSIS — Z124 Encounter for screening for malignant neoplasm of cervix: Secondary | ICD-10-CM | POA: Insufficient documentation

## 2020-04-05 DIAGNOSIS — F411 Generalized anxiety disorder: Secondary | ICD-10-CM | POA: Diagnosis not present

## 2020-04-06 LAB — CBC
HCT: 39.8 % (ref 35.0–45.0)
Hemoglobin: 13.3 g/dL (ref 11.7–15.5)
MCH: 32.3 pg (ref 27.0–33.0)
MCHC: 33.4 g/dL (ref 32.0–36.0)
MCV: 96.6 fL (ref 80.0–100.0)
MPV: 11.5 fL (ref 7.5–12.5)
Platelets: 209 10*3/uL (ref 140–400)
RBC: 4.12 10*6/uL (ref 3.80–5.10)
RDW: 12 % (ref 11.0–15.0)
WBC: 7.5 10*3/uL (ref 3.8–10.8)

## 2020-04-06 LAB — CYTOLOGY - PAP: Diagnosis: NEGATIVE

## 2020-04-07 ENCOUNTER — Other Ambulatory Visit: Payer: Self-pay | Admitting: Medical-Surgical

## 2020-04-07 DIAGNOSIS — F411 Generalized anxiety disorder: Secondary | ICD-10-CM

## 2020-04-07 DIAGNOSIS — F418 Other specified anxiety disorders: Secondary | ICD-10-CM

## 2020-04-07 LAB — COMPLETE METABOLIC PANEL WITH GFR
AG Ratio: 2 (calc) (ref 1.0–2.5)
ALT: 21 U/L (ref 6–29)
AST: 15 U/L (ref 10–30)
Albumin: 4.3 g/dL (ref 3.6–5.1)
Alkaline phosphatase (APISO): 57 U/L (ref 31–125)
BUN: 12 mg/dL (ref 7–25)
CO2: 27 mmol/L (ref 20–32)
Calcium: 9.8 mg/dL (ref 8.6–10.2)
Chloride: 106 mmol/L (ref 98–110)
Creat: 0.66 mg/dL (ref 0.50–1.10)
GFR, Est African American: 141 mL/min/{1.73_m2} (ref >=60)
GFR, Est Non African American: 122 mL/min/{1.73_m2} (ref >=60)
Globulin: 2.2 g/dL (calc) (ref 1.9–3.7)
Glucose, Bld: 82 mg/dL (ref 65–99)
Potassium: 3.9 mmol/L (ref 3.5–5.3)
Sodium: 140 mmol/L (ref 135–146)
Total Bilirubin: 0.3 mg/dL (ref 0.2–1.2)
Total Protein: 6.5 g/dL (ref 6.1–8.1)

## 2020-04-07 LAB — LIPID PANEL
Cholesterol: 224 mg/dL — ABNORMAL HIGH (ref <200)
HDL: 76 mg/dL (ref >=50)
LDL Cholesterol (Calc): 127 mg/dL (calc) — ABNORMAL HIGH
Non-HDL Cholesterol (Calc): 148 mg/dL (calc) — ABNORMAL HIGH (ref <130)
Total CHOL/HDL Ratio: 2.9 (calc) (ref <5.0)
Triglycerides: 105 mg/dL (ref <150)

## 2020-04-07 LAB — HIV ANTIBODY (ROUTINE TESTING W REFLEX): HIV 1&2 Ab, 4th Generation: NONREACTIVE

## 2020-04-08 ENCOUNTER — Encounter: Payer: Self-pay | Admitting: Medical-Surgical

## 2020-04-08 ENCOUNTER — Telehealth (INDEPENDENT_AMBULATORY_CARE_PROVIDER_SITE_OTHER): Payer: Managed Care, Other (non HMO) | Admitting: Medical-Surgical

## 2020-04-08 VITALS — Temp 98.0°F

## 2020-04-08 DIAGNOSIS — B349 Viral infection, unspecified: Secondary | ICD-10-CM

## 2020-04-08 MED ORDER — BENZONATATE 100 MG PO CAPS: 200.0000 mg | ORAL_CAPSULE | Freq: Three times a day (TID) | ORAL | 0 refills | Status: DC | PRN

## 2020-04-08 MED ORDER — OSELTAMIVIR PHOSPHATE 75 MG PO CAPS: 75.0000 mg | ORAL_CAPSULE | Freq: Two times a day (BID) | ORAL | 0 refills | Status: DC

## 2020-04-08 NOTE — Progress Notes (Signed)
Virtual Visit via Video Note  I connected with Tonya Hart on 04/08/20 at 10:10 AM EDT by a video enabled telemedicine application and verified that I am speaking with the correct person using two identifiers.   I discussed the limitations of evaluation and management by telemedicine and the availability of in person appointments. The patient expressed understanding and agreed to proceed.  Subjective:    CC: sinus symptoms  HPI: Pleasant 29 year old female presenting via MyChart video visit with reports of headache, nausea/vomiting, body aches, sore throat, rhinorrhea, chills, fatigue, nasal congestion, facial pressure, ear popping, and cough productive of yellow mucus that started abruptly on awakening yesterday. No fever or SOB. Has not had COVID vaccines. No known sick contacts.  Past medical history, Surgical history, Family history not pertinant except as noted below, Social history, Allergies, and medications have been entered into the medical record, reviewed, and corrections made.   Review of Systems: See HPI for pertinent positives and negatives.  Objective:    General: Speaking clearly in complete sentences without any shortness of breath.  Alert and oriented x3.  Normal judgment. Ill-appearing. No apparent acute distress.  Impression and Recommendations:    1. Viral syndrome Given abrupt onset, suspicious for influenza vs. COVID. Recommend COVID testing, discussed testing options in her area. Will go ahead and treat with Tamiflu BID x 5 days. Tessalon Perls TID as needed for cough. Continue with OTC cold and flu medications. Discussed supportive treatment recommendations for sore throat. Increase rest and stay well hydrated.   - oseltamivir (TAMIFLU) 75 MG capsule; Take 1 capsule (75 mg total) by mouth 2 (two) times daily.  Dispense: 10 capsule; Refill: 0 - benzonatate (TESSALON) 100 MG capsule; Take 2 capsules (200 mg total) by mouth 3 (three) times daily as needed for cough.   Dispense: 21 capsule; Refill: 0  I discussed the assessment and treatment plan with the patient. The patient was provided an opportunity to ask questions and all were answered. The patient agreed with the plan and demonstrated an understanding of the instructions.   The patient was advised to call back or seek an in-person evaluation if the symptoms worsen or if the condition fails to improve as anticipated.  Return if symptoms worsen or fail to improve.  20 minutes of non-face-to-face time was provided during this encounter.  Thayer Ohm, DNP, APRN, FNP-BC [facility]

## 2020-04-13 ENCOUNTER — Telehealth: Payer: Self-pay

## 2020-04-13 ENCOUNTER — Encounter: Payer: Self-pay | Admitting: Medical-Surgical

## 2020-04-13 NOTE — Telephone Encounter (Signed)
Pt states she was told if she needed a work note to give [place] a call. Her last day at work was on 04/07/2020 and she states she will be returning on 04/15/2020. Pt is aware that once the letter is written that she will be able to pull it up through her MyChart account.

## 2020-04-16 NOTE — Telephone Encounter (Signed)
Letter has been generated and is available for pt in MyChart

## 2020-04-19 NOTE — Progress Notes (Signed)
Subjective:    CC: mood follow up  HPI: Pleasant 29 year old female presenting today for anxiety/depression follow-up.  She has been taking Lexapro 10 mg daily and BuSpar 10 mg 3 times daily as needed.  Taking the medications without difficulty and is having no side effects.  She reports that she had significant increase in anxiety while she was recently sick and stuck at home.  She did have thoughts at that time that she would be better off dead but she did not have a plan and did not attempt any self-harm.  She reports since she has gotten over her viral illness and is back to work she no longer has self-harm thoughts.  Reports having several panic attacks over the past 2 weeks that ended with her curled in a ball in the fetal position, shaking, and crying.  Does not feel the BuSpar has been very helpful.  Also does not note much of a change with Lexapro at 10 mg daily.  Would like to try any interventions possible before being seen by psychiatry.  Not open at this time to counseling.  I reviewed the past medical history, family history, social history, surgical history, and allergies today and no changes were needed.  Please see the problem list section below in epic for further details.  Past Medical History: Past Medical History:  Diagnosis Date  . Anxiety and depression    no current medications  . Asthma   . Environmental and seasonal allergies    Past Surgical History: Past Surgical History:  Procedure Laterality Date  . TONSILLECTOMY    . TONSILLECTOMY AND ADENOIDECTOMY     Social History: Social History   Socioeconomic History  . Marital status: Single    Spouse name: Not on file  . Number of children: Not on file  . Years of education: Not on file  . Highest education level: Not on file  Occupational History  . Occupation: ISS    Employer: [employer]  Tobacco Use  . Smoking status: Former Smoker    Types: Cigarettes  . Smokeless tobacco: Never Used   Substance and Sexual Activity  . Alcohol use: Yes    Comment: 3 drinks/week, beer or liquor  . Drug use: Yes    Frequency: 7.0 times per week    Types: Marijuana  . Sexual activity: Yes    Partners: Male    Birth control/protection: None  Other Topics Concern  . Not on file  Social History Narrative  . Not on file   Social Determinants of Health   Financial Resource Strain:   . Difficulty of Paying Living Expenses:   Food Insecurity:   . Worried About Programme researcher, broadcasting/film/video in the Last Year:   . Barista in the Last Year:   Transportation Needs:   . Freight forwarder (Medical):   Marland Kitchen Lack of Transportation (Non-Medical):   Physical Activity:   . Days of Exercise per Week:   . Minutes of Exercise per Session:   Stress:   . Feeling of Stress :   Social Connections:   . Frequency of Communication with Friends and Family:   . Frequency of Social Gatherings with Friends and Family:   . Attends Religious Services:   . Active Member of Clubs or Organizations:   . Attends Banker Meetings:   Marland Kitchen Marital Status:    Family History: Family History  Problem Relation Age of Onset  . Thyroid disease Mother   .  Hyperlipidemia Mother   . Hypertension Mother   . Cataracts Mother   . Hyperlipidemia Father   . COPD Father   . Heart attack Father   . Hypertension Father   . Diabetes Father   . Prostate cancer Maternal Grandfather    Allergies: Allergies  Allergen Reactions  . Chicken Meat (Diagnostic) Anaphylaxis  . Bee Venom Hives    swelling  . Latex Hives  . Soap Hives    Dove soap   Medications: See med rec.  Review of Systems: See HPI for pertinent positives and negatives.  Depression screen [facility] 2/9 04/20/2020 04/05/2020 03/16/2020  Decreased Interest 2 2 2   Down, Depressed, Hopeless 2 2 2   PHQ - 2 Score 4 4 4   Altered sleeping 2 2 2   Tired, decreased energy 3 2 1   Change in appetite 2 2 2   Feeling bad or failure about yourself  2 1 1   Trouble  concentrating 2 2 1   Moving slowly or fidgety/restless 2 2 2   Suicidal thoughts 1 0 0  PHQ-9 Score 18 15 13   Difficult doing work/chores - Somewhat difficult Somewhat difficult   GAD 7 : Generalized Anxiety Score 04/20/2020 04/05/2020 03/16/2020  Nervous, Anxious, on Edge 3 3 2   Control/stop worrying 3 3 2   Worry too much - different things 3 3 2   Trouble relaxing 2 2 2   Restless 2 2 1   Easily annoyed or irritable 3 3 2   Afraid - awful might happen 3 2 2   Total GAD 7 Score 19 18 13   Anxiety Difficulty Very difficult Somewhat difficult Somewhat difficult     Objective:    General: Well Developed, well nourished, and in no acute distress.  Neuro: Alert and oriented x3.  HEENT: Normocephalic, atraumatic.  Skin: Warm and dry. Cardiac: Regular rate and rhythm, no murmurs rubs or gallops, no lower extremity edema.  Respiratory: Clear to auscultation bilaterally. Not using accessory muscles, speaking in full sentences.   Impression and Recommendations:    1. Depression with anxiety Increase Lexapro to 20 mg daily. - escitalopram (LEXAPRO) 20 MG tablet; Take 1 tablet (20 mg total) by mouth daily.  Dispense: 30 tablet; Refill: 1  2. Anxiety state May continue to use BuSpar 10 mg 3 times daily as needed.  We will send in a prescription for Klonopin 0.25-0.5 mg daily as needed for anxiety.  Stressed the need to use this drug very sparingly as this medication does have addictive qualities.  Discussed with patient that Klonopin is indicated for short-term use while we are adjusting her maintenance medications to optimal therapy. - clonazePAM (KLONOPIN) 0.5 MG tablet; Take 0.5-1 tablets (0.25-0.5 mg total) by mouth daily as needed for anxiety.  Dispense: 30 tablet; Refill: 0  3. Anaphylaxis, sequela Refills sent in for EpiPen at patient request due to her pen at home being out of date. - EPINEPHrine 0.3 mg/0.3 mL IJ SOAJ injection; Inject 0.3 mLs (0.3 mg total) into the muscle once for 1 dose.   Dispense: 0.3 mL; Refill: 2  Return in about 4 weeks (around 05/18/2020) for mood follow up (can be virtual). ___________________________________________ Clearnce Sorrel, DNP, APRN, FNP-BC Primary [facility] and Sports Medicine [place]

## 2020-04-20 ENCOUNTER — Encounter: Payer: Self-pay | Admitting: Medical-Surgical

## 2020-04-20 ENCOUNTER — Ambulatory Visit (INDEPENDENT_AMBULATORY_CARE_PROVIDER_SITE_OTHER): Payer: Managed Care, Other (non HMO) | Admitting: Medical-Surgical

## 2020-04-20 VITALS — BP 121/86 | HR 76 | Temp 98.1°F | Ht 68.25 in | Wt 201.1 lb

## 2020-04-20 DIAGNOSIS — F418 Other specified anxiety disorders: Secondary | ICD-10-CM

## 2020-04-20 DIAGNOSIS — F411 Generalized anxiety disorder: Secondary | ICD-10-CM

## 2020-04-20 DIAGNOSIS — T782XXS Anaphylactic shock, unspecified, sequela: Secondary | ICD-10-CM

## 2020-04-20 MED ORDER — EPINEPHRINE 0.3 MG/0.3ML IJ SOAJ: 0.3000 mg | Freq: Once | INTRAMUSCULAR | 2 refills | Status: AC

## 2020-04-20 MED ORDER — ESCITALOPRAM OXALATE 20 MG PO TABS: 20.0000 mg | ORAL_TABLET | Freq: Every day | ORAL | 1 refills | Status: DC

## 2020-04-20 MED ORDER — CLONAZEPAM 0.5 MG PO TABS: 0.2500 mg | ORAL_TABLET | Freq: Every day | ORAL | 0 refills | Status: DC | PRN

## 2020-05-10 ENCOUNTER — Encounter: Payer: Self-pay | Admitting: Medical-Surgical

## 2020-05-10 ENCOUNTER — Ambulatory Visit (INDEPENDENT_AMBULATORY_CARE_PROVIDER_SITE_OTHER): Payer: Managed Care, Other (non HMO) | Admitting: Medical-Surgical

## 2020-05-10 ENCOUNTER — Other Ambulatory Visit: Payer: Self-pay

## 2020-05-10 VITALS — BP 128/88 | HR 91 | Temp 98.4°F | Ht 68.25 in | Wt 206.0 lb

## 2020-05-10 DIAGNOSIS — R102 Pelvic and perineal pain: Secondary | ICD-10-CM

## 2020-05-10 DIAGNOSIS — N946 Dysmenorrhea, unspecified: Secondary | ICD-10-CM

## 2020-05-10 LAB — POCT URINE PREGNANCY: Preg Test, Ur: NEGATIVE

## 2020-05-10 NOTE — Progress Notes (Signed)
Subjective:    CC: dysmenorrhea  HPI: Pleasant 28 year old female presenting today to discuss irregular menstrual cycles and severe symptoms that accompany them.  She reports that menarche occurred around age 86.  Her menstrual cycles are irregular with the shortest at 15 days and the longest at 46 days.  She reports bleeding for approximately 5 to 6 days, the first 3 days are very heavy followed by 2 days that are lighter and 1 day of spotting.  She experiences severe lower abdominal cramps as well as severe low back pain during menstruation.  She also reports significant nausea that limits her ability to eat regularly.  Her symptoms began approximately 1 week to her cycle starting with mild nausea, low back pain, and increased urination.  She endorses passing large clots on the first 3 days of her cycle.  Due to severe cramping she is only able to use pads for the first few days.  Has found mild relief using a heating pad, taking hot showers, and engaging in intercourse but the relief is very temporary.  She notes that her menstrual cycles and symptoms have been like this since her period started.  She did try COC's for approximately 1.5 years but notes she still had bad cramps.  She is currently sexually active in a monogamous relationship.  Her and her partner are trying to get pregnant at this time so birth control is not an option.  She endorses dyspareunia occasionally.  Last menstrual period 03/28/2020.  Denies vaginal odor, discharge, intermenstrual bleeding, itching, and history of STIs.  Admits to smoking marijuana and reports this helps her tremendously when she is experiencing the worst of her symptoms as it reduces her nausea and enables her to eat.  Also using a CBD topical treatment that she rubs on her abdomen to help with her cramps.  I reviewed the past medical history, family history, social history, surgical history, and allergies today and no changes were needed.  Please see the problem  list section below in epic for further details.  Past Medical History: Past Medical History:  Diagnosis Date  . Anxiety and depression    no current medications  . Asthma   . Environmental and seasonal allergies    Past Surgical History: Past Surgical History:  Procedure Laterality Date  . TONSILLECTOMY    . TONSILLECTOMY AND ADENOIDECTOMY     Social History: Social History   Socioeconomic History  . Marital status: Single    Spouse name: Not on file  . Number of children: Not on file  . Years of education: Not on file  . Highest education level: Not on file  Occupational History  . Occupation: ISS    Employer: [employer]  Tobacco Use  . Smoking status: Former Smoker    Types: Cigarettes  . Smokeless tobacco: Never Used  Vaping Use  . Vaping Use: Every day  . Substances: Nicotine  Substance and Sexual Activity  . Alcohol use: Yes    Comment: 3 drinks/week, beer or liquor  . Drug use: Yes    Frequency: 7.0 times per week    Types: Marijuana  . Sexual activity: Yes    Partners: Male    Birth control/protection: None  Other Topics Concern  . Not on file  Social History Narrative  . Not on file   Social Determinants of Health   Financial Resource Strain:   . Difficulty of Paying Living Expenses:   Food Insecurity:   . Worried About Running  Out of Food in the Last Year:   . Ran Out of Food in the Last Year:   Transportation Needs:   . Lack of Transportation (Medical):   Marland Kitchen Lack of Transportation (Non-Medical):   Physical Activity:   . Days of Exercise per Week:   . Minutes of Exercise per Session:   Stress:   . Feeling of Stress :   Social Connections:   . Frequency of Communication with Friends and Family:   . Frequency of Social Gatherings with Friends and Family:   . Attends Religious Services:   . Active Member of Clubs or Organizations:   . Attends Banker Meetings:   Marland Kitchen Marital Status:    Family History: Family History   Problem Relation Age of Onset  . Thyroid disease Mother   . Hyperlipidemia Mother   . Hypertension Mother   . Cataracts Mother   . Hyperlipidemia Father   . COPD Father   . Heart attack Father   . Hypertension Father   . Diabetes Father   . Prostate cancer Maternal Grandfather    Allergies: Allergies  Allergen Reactions  . Chicken Meat (Diagnostic) Anaphylaxis  . Bee Venom Hives    swelling  . Latex Hives  . Soap Hives    Dove soap   Medications: See med rec.  Review of Systems: See HPI for pertinent positives and negatives.   Objective:    General: Well Developed, well nourished, and in no acute distress.  Neuro: Alert and oriented x3.  HEENT: Normocephalic, atraumatic.  Skin: Warm and dry. Cardiac: Regular rate and rhythm, no murmurs rubs or gallops, no lower extremity edema.  Respiratory: Clear to auscultation bilaterally. Not using accessory muscles, speaking in full sentences. Abdomen: Soft, nondistended.  Tender to bilateral lower quadrants.  Bowel sounds positive x4.  No HSM appreciated   Impression and Recommendations:    1. Dysmenorrhea/pelvic pain Given timing of last menstrual cycle, doing POCT urine pregnancy test today (negative).  Recent Pap smear normal but no STI testing done.  Sending urine for gonorrhea and chlamydia.  We will also go ahead and plan for a transvaginal ultrasound to evaluate for secondary causes of dysmenorrhea.  Discussed treatment for dysmenorrhea involving use of anti-inflammatories and oral contraceptives.  Given that she and her partner are attempting pregnancy, oral contraceptives are not an option.  She is not partial to the idea of using anti-inflammatories as she prefers natural remedies if at all possible.  For now, we will evaluate her ultrasound results then make further plans. - US PELVIC COMPLETE WITH TRANSVAGINAL; Future - POCT urine pregnancy  Return if symptoms worsen or fail to  improve. ___________________________________________ Thayer Ohm, DNP, APRN, FNP-BC Primary Care and Sports Medicine [facility]

## 2020-05-11 ENCOUNTER — Ambulatory Visit (INDEPENDENT_AMBULATORY_CARE_PROVIDER_SITE_OTHER): Payer: Managed Care, Other (non HMO)

## 2020-05-11 DIAGNOSIS — N946 Dysmenorrhea, unspecified: Secondary | ICD-10-CM | POA: Diagnosis not present

## 2020-05-11 LAB — C. TRACHOMATIS/N. GONORRHOEAE RNA
C. trachomatis RNA, TMA: NOT DETECTED
N. gonorrhoeae RNA, TMA: NOT DETECTED

## 2020-05-11 IMAGING — US US PELVIS COMPLETE WITH TRANSVAGINAL
1 series · 13 of 25 positions shown · non-contrast
Comparison: None

CLINICAL DATA: Dysmenorrhea, irregular menses infertility, LMP
[DATE]

EXAM:
TRANSABDOMINAL AND TRANSVAGINAL ULTRASOUND OF PELVIS
TECHNIQUE: Both transabdominal and transvaginal ultrasound examinations of the
pelvis were performed. Transabdominal technique was performed for
global imaging of the pelvis including uterus, ovaries, adnexal
regions, and pelvic cul-de-sac. It was necessary to proceed with
endovaginal exam following the transabdominal exam to visualize the
endometrium and ovaries.

[Series 1: us pelvis complete with transvaginal · 0.24mm/px · 13 of 98 slices shown]
[im 1/98]
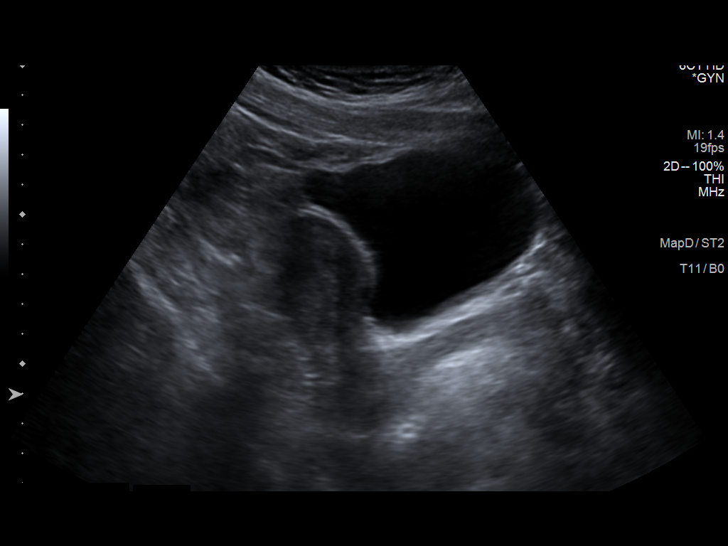
[im 9/98]
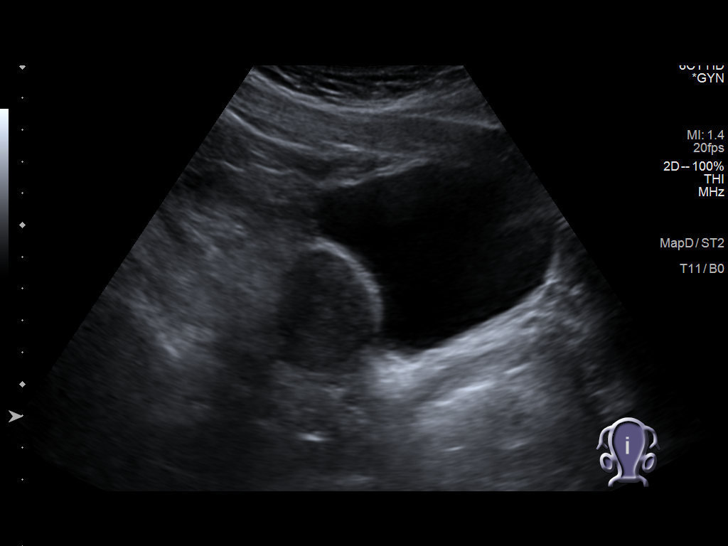
[im 17/98]
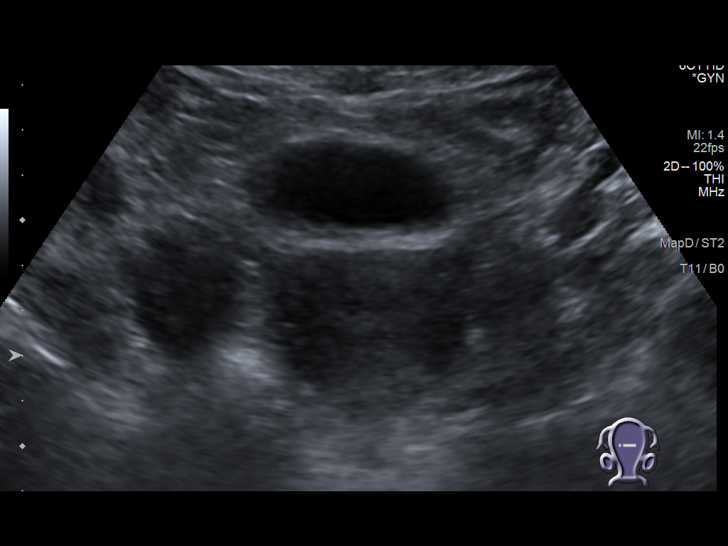
[im 25/98]
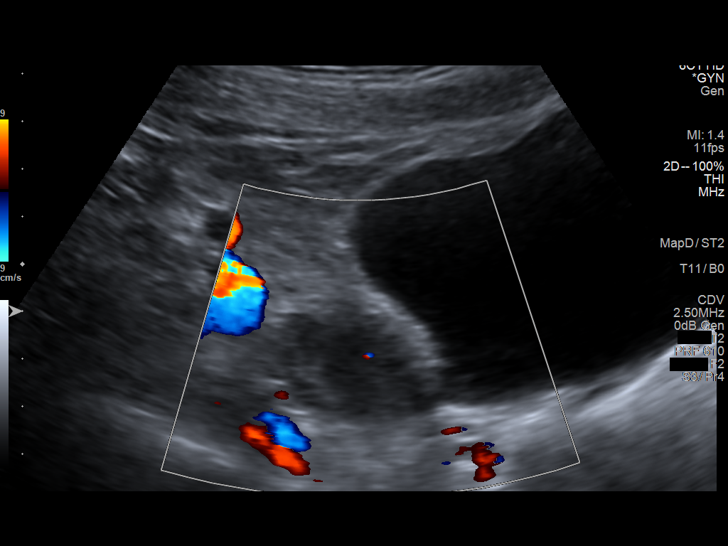
[im 33/98]
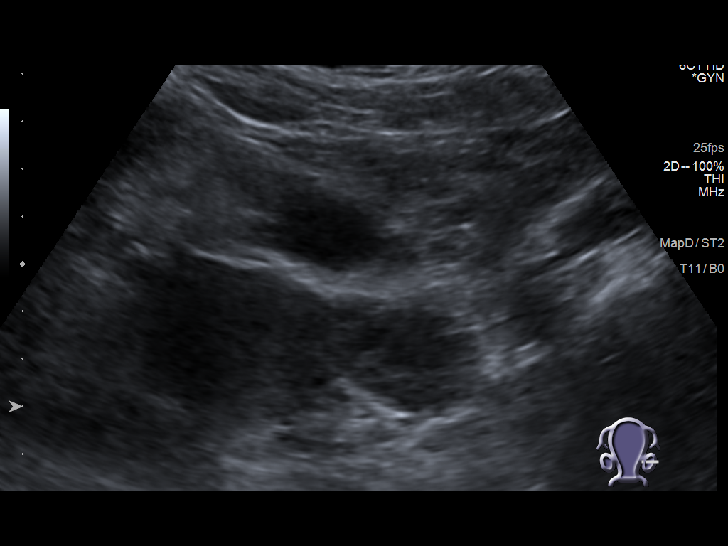
[im 41/98]
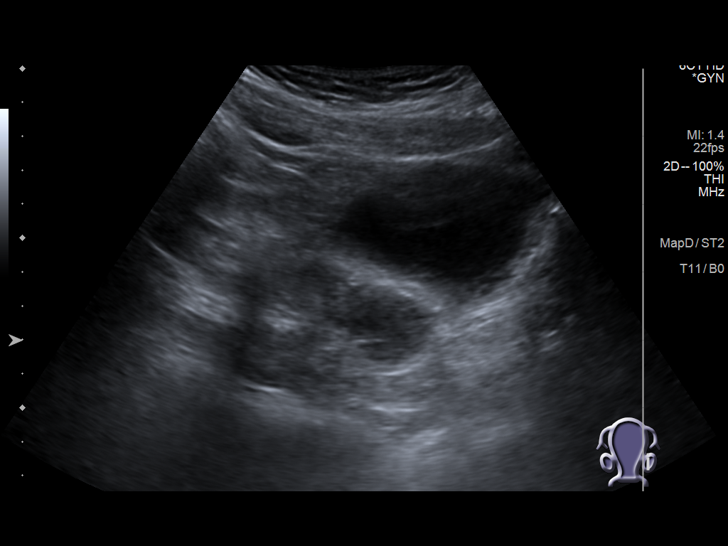
[im 49/98]
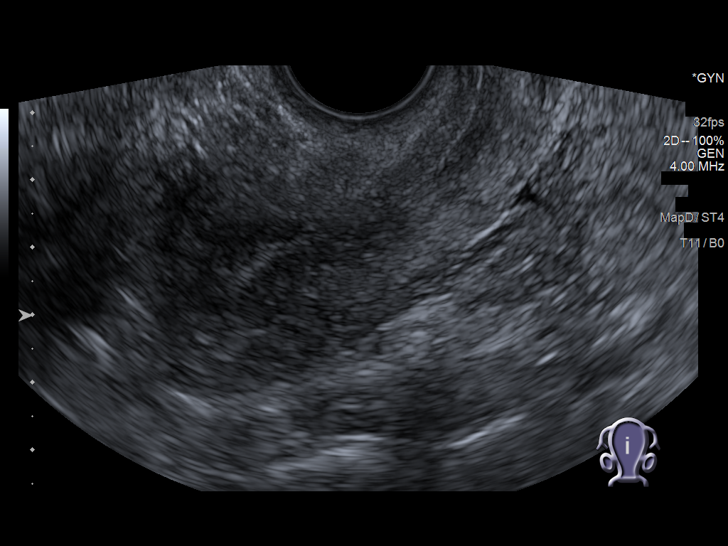
[im 57/98]
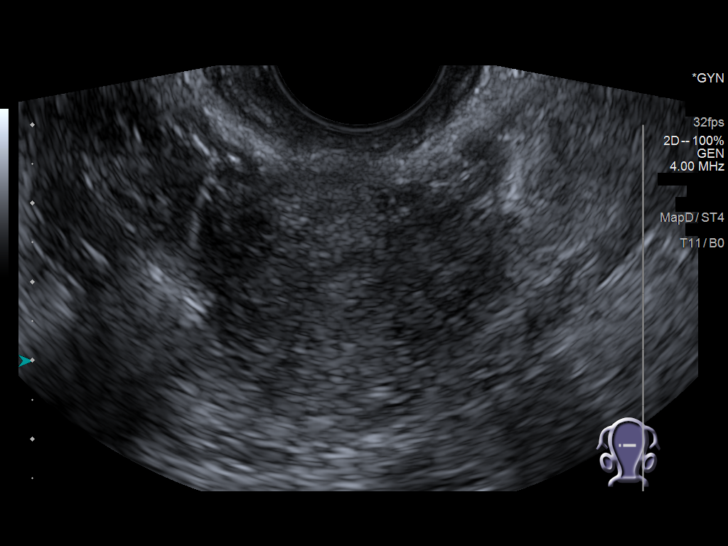
[im 65/98]
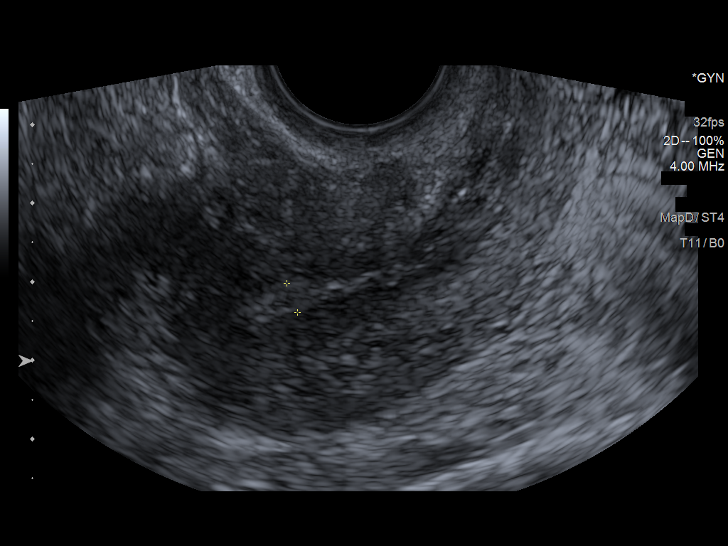
[im 73/98]
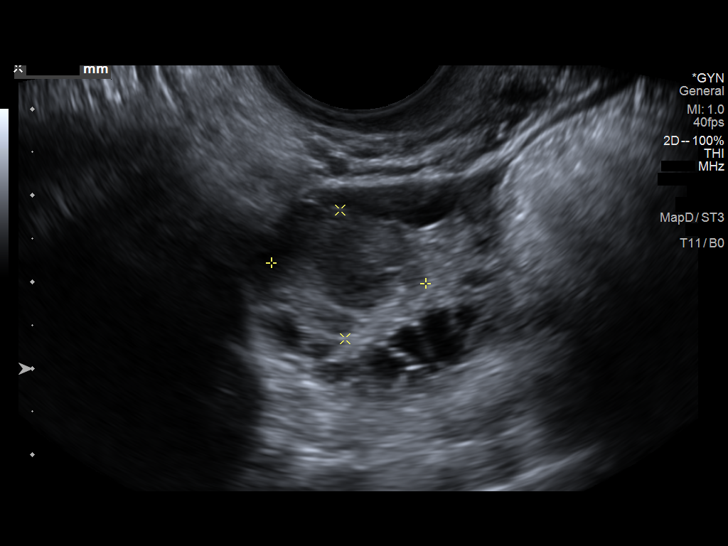
[im 81/98]
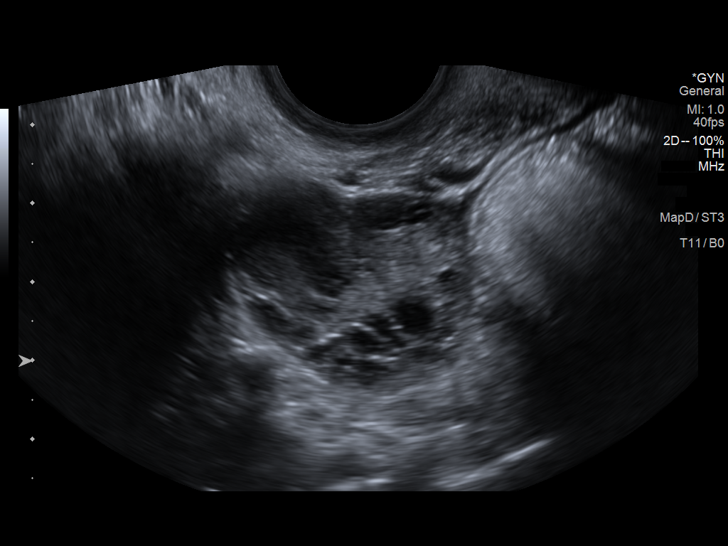
[im 89/98]
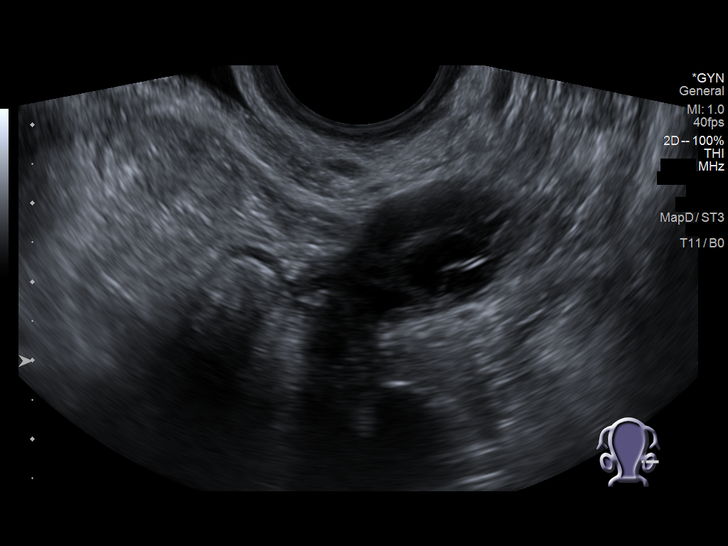
[im 98/98]
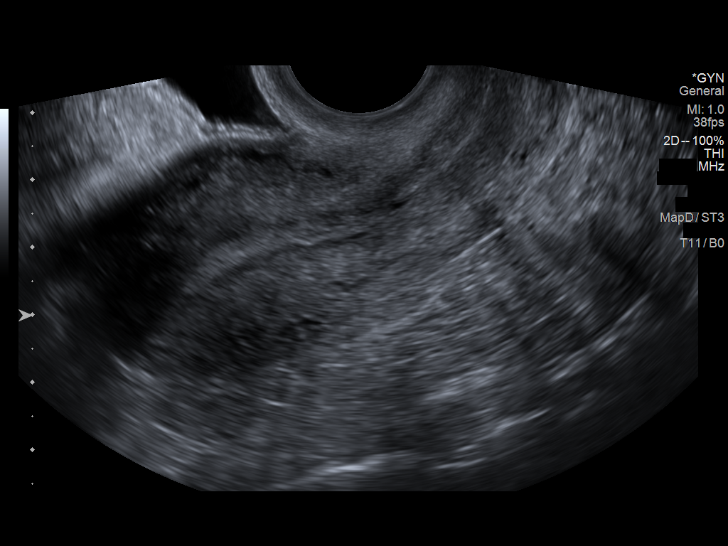

[13 of 25 positions shown; findings below may reference images not displayed]

FINDINGS: Uterus

Measurements: 7.9 x 2.9 x 4.3 cm = volume: 52 mL. Anteverted.
Slightly heterogeneous myometrium without focal mass

Endometrium

Thickness: 4 mm.  No endometrial fluid or focal abnormality

Right ovary

Measurements: 3.5 x 3.2 x 3.0 cm = volume: 18 mL. Mildly enlarged.
Numerous peripheral follicles, appearance suggesting a polycystic
ovary. Small corpus luteum without dominant mass.

Left ovary

Measurements: 3.4 x 1.7 x 2.5 cm = volume: 8 mL. Numerous peripheral
follicles suggesting a polycystic appearance. No dominant mass.

Other findings

No free pelvic fluid.  No adnexal masses.
IMPRESSION: Unremarkable uterus and endometrial complex.

Numerous peripheral follicles within both ovaries, RIGHT ovary
slightly enlarged, raising question of polycystic ovarian syndrome;
recommend clinical and laboratory correlation.

## 2020-05-12 ENCOUNTER — Other Ambulatory Visit: Payer: Self-pay | Admitting: Medical-Surgical

## 2020-05-12 DIAGNOSIS — N926 Irregular menstruation, unspecified: Secondary | ICD-10-CM

## 2020-05-12 DIAGNOSIS — N946 Dysmenorrhea, unspecified: Secondary | ICD-10-CM

## 2020-05-12 DIAGNOSIS — N915 Oligomenorrhea, unspecified: Secondary | ICD-10-CM

## 2020-05-12 NOTE — Progress Notes (Signed)
TVUS showing bilateral ovaries affected by multiple follicles consistent with PCOS.  In the setting of dysmenorrhea with oligomenorrhea and irregular periods, checking labs to evaluate for other causes of symptoms.

## 2020-05-14 ENCOUNTER — Other Ambulatory Visit: Payer: Self-pay | Admitting: Medical-Surgical

## 2020-05-14 DIAGNOSIS — F418 Other specified anxiety disorders: Secondary | ICD-10-CM

## 2020-05-16 LAB — TESTOSTERONE, FREE & TOTAL
Free Testosterone: 4.7 pg/mL (ref 0.1–6.4)
Testosterone, Total, LC-MS-MS: 51 ng/dL — ABNORMAL HIGH (ref 2–45)

## 2020-05-16 LAB — 17-HYDROXYPROGESTERONE: 17-OH-Progesterone, LC/MS/MS: 87 ng/dL

## 2020-05-16 LAB — PROLACTIN: Prolactin: 14.3 ng/mL

## 2020-05-16 LAB — TSH: TSH: 0.73 mIU/L

## 2020-05-16 LAB — FOLLICLE STIMULATING HORMONE: FSH: 6.1 m[IU]/mL

## 2020-05-18 ENCOUNTER — Telehealth (INDEPENDENT_AMBULATORY_CARE_PROVIDER_SITE_OTHER): Payer: Managed Care, Other (non HMO) | Admitting: Medical-Surgical

## 2020-05-18 ENCOUNTER — Encounter: Payer: Self-pay | Admitting: Medical-Surgical

## 2020-05-18 DIAGNOSIS — F411 Generalized anxiety disorder: Secondary | ICD-10-CM

## 2020-05-18 DIAGNOSIS — N946 Dysmenorrhea, unspecified: Secondary | ICD-10-CM | POA: Insufficient documentation

## 2020-05-18 DIAGNOSIS — N915 Oligomenorrhea, unspecified: Secondary | ICD-10-CM | POA: Diagnosis not present

## 2020-05-18 DIAGNOSIS — F418 Other specified anxiety disorders: Secondary | ICD-10-CM | POA: Diagnosis not present

## 2020-05-18 DIAGNOSIS — N926 Irregular menstruation, unspecified: Secondary | ICD-10-CM | POA: Diagnosis not present

## 2020-05-18 MED ORDER — CLONAZEPAM 0.5 MG PO TABS: 0.5000 mg | ORAL_TABLET | Freq: Every day | ORAL | 0 refills | Status: DC | PRN

## 2020-05-18 MED ORDER — ONDANSETRON 8 MG PO TBDP: 8.0000 mg | ORAL_TABLET | Freq: Three times a day (TID) | ORAL | 3 refills | Status: DC | PRN

## 2020-05-18 MED ORDER — BUSPIRONE HCL 10 MG PO TABS: 10.0000 mg | ORAL_TABLET | Freq: Two times a day (BID) | ORAL | 2 refills | Status: DC | PRN

## 2020-05-18 MED ORDER — MELOXICAM 15 MG PO TABS
15.0000 mg | ORAL_TABLET | Freq: Every day | ORAL | 0 refills | Status: DC
Start: 2020-05-18 — End: 2020-06-08

## 2020-05-18 NOTE — Progress Notes (Signed)
Virtual Visit via Video Note  I connected with Tonya Hart on 05/18/20 at  2:00 PM EDT by a video enabled telemedicine application and verified that I am speaking with the correct person using two identifiers.   I discussed the limitations of evaluation and management by telemedicine and the availability of in person appointments. The patient expressed understanding and agreed to proceed.  Patient location: home Provider locations: office  Subjective:    CC: Anxiety/depression follow-up, discuss recent labs/testing  HPI: Pleasant 29 year old female presenting today for anxiety depression follow-up and to discuss the recent results from her labs and pelvic ultrasound.  Anxiety depression-she is taking Lexapro 20 mg daily.  She is using BuSpar 10 mg twice daily.  She also has Klonopin 0.5 mg daily as needed.  Recently she has had an increase in anxiety as we are working her up for dysmenorrhea and she is concerned about what is going on with her body.  She notes that she used clonazepam daily almost every day last week but usually aims to use it sparingly 3-4 times per week.  Feels that this regimen is helping to control her symptoms fairly well.  Denies SI/HI.  Test results-transvaginal ultrasound showed multiple follicles in both ovaries consistent with PCOS.  Further labs drawn indicated elevated testosterone but normal 17 hydroxyprogesterone, prolactin, FSH, and TSH.  Menstrual cycles irregular accompanied by severe cramping with nausea.  She is currently experiencing significant cramping that interferes with sleep at night as well as intermittent nausea.  Endorses having a few dark thick hairs that come up on her chin that she plucks as needed as well as a "happy trail" along her lower abdomen that she shaves.  Clinical presentation correlates with ultrasound to indicate PCOS.  She does not currently have a relationship with an OB/GYN but is open to seeing one.  She and her partner are hoping  to conceive so we will not start oral contraceptives at this point.   Past medical history, Surgical history, Family history not pertinant except as noted below, Social history, Allergies, and medications have been entered into the medical record, reviewed, and corrections made.   Review of Systems: No fevers, chills, night sweats, weight loss, chest pain, or shortness of breath.   Objective:    General: Speaking clearly in complete sentences without any shortness of breath.  Alert and oriented x3.  Normal judgment. No apparent acute distress.  Impression and Recommendations:    1. Dysmenorrhea Discussed lab findings as well as ultrasound findings.  With elevated testosterone, irregular menses/oligomenorrhea, and hirsutism she fits criteria for diagnosis of PCOS.  Has not tried NSAIDs to help with dysmenorrhea symptoms but is open to giving it a try.  Sending in meloxicam 15 mg daily as needed for menstrual cramps.  Sending in ondansetron 8 mg ODT every 8 hours as needed.  Referring to OB/GYN for further evaluation and discussion of treatment.  2. Depression with anxiety Continue Lexapro 20 mg daily.  Continue BuSpar 10 mg twice daily as needed.  Okay to continue clonazepam 0.5 mg daily as needed for severe anxiety.  Refills provided of BuSpar and clonazepam.  No follow-ups on file.   20 minutes of non-face-to-face time was provided during this encounter.  I discussed the assessment and treatment plan with the patient. The patient was provided an opportunity to ask questions and all were answered. The patient agreed with the plan and demonstrated an understanding of the instructions.   The patient was advised to call  back or seek an in-person evaluation if the symptoms worsen or if the condition fails to improve as anticipated.  Thayer Ohm, DNP, APRN, FNP-BC [facility] and Sports Medicine

## 2020-05-27 ENCOUNTER — Encounter: Payer: Self-pay | Admitting: Medical-Surgical

## 2020-06-03 ENCOUNTER — Encounter: Payer: Self-pay | Admitting: Obstetrics and Gynecology

## 2020-06-03 ENCOUNTER — Ambulatory Visit: Payer: Managed Care, Other (non HMO) | Admitting: Obstetrics and Gynecology

## 2020-06-03 ENCOUNTER — Other Ambulatory Visit: Payer: Self-pay

## 2020-06-03 VITALS — BP 119/71 | HR 99 | Resp 16 | Ht 68.0 in | Wt 211.0 lb

## 2020-06-03 DIAGNOSIS — E282 Polycystic ovarian syndrome: Secondary | ICD-10-CM | POA: Diagnosis not present

## 2020-06-03 MED ORDER — METFORMIN HCL 500 MG PO TABS: ORAL_TABLET | ORAL | 5 refills | Status: DC

## 2020-06-03 NOTE — Patient Instructions (Signed)

## 2020-06-03 NOTE — Progress Notes (Signed)
29 yo P0 presenting today for the evaluation of irregular menses. Patient reports cycles being 46-49 days long. She often skips two months. She also reports severe dysmenorrhea associated with her menses. She was previously on COC without improvement in her dysmenorrhea. Patient is currently interested in conceiving. Patient is without any other complaints  Past Medical History:  Diagnosis Date  . Anxiety and depression    no current medications  . Asthma   . Environmental and seasonal allergies    Past Surgical History:  Procedure Laterality Date  . TONSILLECTOMY    . TONSILLECTOMY AND ADENOIDECTOMY     Family History  Problem Relation Age of Onset  . Thyroid disease Mother   . Hyperlipidemia Mother   . Hypertension Mother   . Cataracts Mother   . Hyperlipidemia Father   . COPD Father   . Heart attack Father   . Hypertension Father   . Diabetes Father   . Prostate cancer Maternal Grandfather    Social History   Tobacco Use  . Smoking status: Former Smoker    Types: Cigarettes  . Smokeless tobacco: Never Used  Vaping Use  . Vaping Use: Every day  . Substances: Nicotine  Substance Use Topics  . Alcohol use: Yes    Comment: 3 drinks/week, beer or liquor  . Drug use: Yes    Frequency: 7.0 times per week    Types: Marijuana   ROS See pertinent in HPI  Blood pressure 119/71, pulse 99, resp. rate 16, height 5\' 8"  (1.727 m), weight 211 lb (95.7 kg), last menstrual period 05/18/2020. GENERAL: Well-developed, well-nourished female in no acute distress.  EXTREMITIES: No cyanosis, clubbing, or edema, 2+ distal pulses.  05/20/2020 PELVIC COMPLETE WITH TRANSVAGINAL  Result Date: 05/11/2020 CLINICAL DATA:  Dysmenorrhea, irregular menses infertility, LMP 03/28/2020 EXAM: TRANSABDOMINAL AND TRANSVAGINAL ULTRASOUND OF PELVIS TECHNIQUE: Both transabdominal and transvaginal ultrasound examinations of the pelvis were performed. Transabdominal technique was performed for global imaging of the  pelvis including uterus, ovaries, adnexal regions, and pelvic cul-de-sac. It was necessary to proceed with endovaginal exam following the transabdominal exam to visualize the endometrium and ovaries. COMPARISON:  None FINDINGS: Uterus Measurements: 7.9 x 2.9 x 4.3 cm = volume: 52 mL. Anteverted. Slightly heterogeneous myometrium without focal mass Endometrium Thickness: 4 mm.  No endometrial fluid or focal abnormality Right ovary Measurements: 3.5 x 3.2 x 3.0 cm = volume: 18 mL. Mildly enlarged. Numerous peripheral follicles, appearance suggesting a polycystic ovary. Small corpus luteum without dominant mass. Left ovary Measurements: 3.4 x 1.7 x 2.5 cm = volume: 8 mL. Numerous peripheral follicles suggesting a polycystic appearance. No dominant mass. Other findings No free pelvic fluid.  No adnexal masses. IMPRESSION: Unremarkable uterus and endometrial complex. Numerous peripheral follicles within both ovaries, RIGHT ovary slightly enlarged, raising question of polycystic ovarian syndrome; recommend clinical and laboratory correlation. Electronically Signed   By: 05/28/2020 M.D.   On: 05/11/2020 17:18   A/P 29 yo with oligomenorrhea likely due to PCOS - Information on PCOS provided along with diet to follow - Rx metformin provided to aid with ovulation - Patient advised to use ovulation predictor kits  - Patient to return in 3 months if no success in improving ovulation rate

## 2020-06-08 ENCOUNTER — Other Ambulatory Visit: Payer: Self-pay | Admitting: Medical-Surgical

## 2020-06-09 ENCOUNTER — Other Ambulatory Visit: Payer: Self-pay | Admitting: Medical-Surgical

## 2020-06-14 ENCOUNTER — Other Ambulatory Visit: Payer: Self-pay

## 2020-06-14 ENCOUNTER — Encounter: Payer: Self-pay | Admitting: Medical-Surgical

## 2020-06-14 ENCOUNTER — Telehealth (INDEPENDENT_AMBULATORY_CARE_PROVIDER_SITE_OTHER): Payer: Managed Care, Other (non HMO) | Admitting: Medical-Surgical

## 2020-06-14 VITALS — Temp 100.1°F | Ht 68.0 in | Wt 211.0 lb

## 2020-06-14 DIAGNOSIS — J069 Acute upper respiratory infection, unspecified: Secondary | ICD-10-CM

## 2020-06-14 MED ORDER — AMOXICILLIN-POT CLAVULANATE 875-125 MG PO TABS: 1.0000 | ORAL_TABLET | Freq: Two times a day (BID) | ORAL | 0 refills | Status: DC

## 2020-06-14 NOTE — Progress Notes (Signed)
Thursday started not feeling well.    She has been coughing up "green stuff" Head is pounding, feeling dizzy,  Can keep toast and water down at this time. Patient reports a lot of sinus pressure,  She is feeling tired.  No chest pain or shortness of breath,   She has not taken anything OTC/   She had a covid test a while back but is not sure the date and it was negative.   Temp has been running around 99 or 100.

## 2020-06-14 NOTE — Progress Notes (Signed)
Virtual Visit via Video Note  I connected with Tonya Hart on 06/14/20 at 11:10 AM EDT by a video enabled telemedicine application and verified that I am speaking with the correct person using two identifiers.   I discussed the limitations of evaluation and management by telemedicine and the availability of in person appointments. The patient expressed understanding and agreed to proceed.  Patient location: home Provider locations: office  Subjective:    CC: cough, nausea, fever, sinus pressure   HPI: Pleasant 29 year old female presenting via MyChart video visit with reports of nausea/vomiting, low grade fever, productive cough of brownish color sputum, sinus pressure, fatigue, headache, ear popping and dizziness x 5 days. Symptoms started with an episode of severe nausea with vomiting while at work on Thursday and have progressed since. Unable to complete basic activities around the home as she gets severely fatigued and dizzy with standing for more than 5-10 minutes. Denies chills, diarrhea, rhinorrhea, ear pain, and hearing changes. Has been using Nyquil at night, helps a little and helps her sleep. Using Zofran which helps during the day. Has not had her COVID vaccines. 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  Past medical history, Surgical history, Family history not pertinant except as noted below, Social history, Allergies, and medications have been entered into the medical record, reviewed, and corrections made.   Review of Systems: See HPI for pertinent positives and negatives.   Objective:    General: Speaking clearly in complete sentences without any shortness of breath.  Alert and oriented x3.  Normal judgment. Ill-appearing, no apparent acute distress.  Impression and Recommendations:    1. Upper respiratory tract infection, unspecified type Recommend COVID testing. Likely initially a viral infection but with worsening of sinus drainage and pressure, will  empirically treat with Augmentin BID for 5 days. Sending OTC URI shopping list via MyChart. Continue Zofran as needed for nausea. Letter for work sent via [employer].    Return if symptoms worsen or fail to improve.  20 minutes of non-face-to-face time was provided during this encounter.  I discussed the assessment and treatment plan with the patient. The patient was provided an opportunity to ask questions and all were answered. The patient agreed with the plan and demonstrated an understanding of the instructions.   The patient was advised to call back or seek an in-person evaluation if the symptoms worsen or if the condition fails to improve as anticipated.  Thayer Ohm, DNP, APRN, FNP-BC [facility] and Sports Medicine

## 2020-06-17 ENCOUNTER — Encounter: Payer: Self-pay | Admitting: Medical-Surgical

## 2020-07-13 ENCOUNTER — Encounter: Payer: Self-pay | Admitting: Medical-Surgical

## 2020-07-13 ENCOUNTER — Telehealth (INDEPENDENT_AMBULATORY_CARE_PROVIDER_SITE_OTHER): Payer: Managed Care, Other (non HMO) | Admitting: Medical-Surgical

## 2020-07-13 VITALS — Temp 99.8°F

## 2020-07-13 DIAGNOSIS — J069 Acute upper respiratory infection, unspecified: Secondary | ICD-10-CM | POA: Diagnosis not present

## 2020-07-13 MED ORDER — PROMETHAZINE-DM 6.25-15 MG/5ML PO SYRP
5.0000 mL | ORAL_SOLUTION | Freq: Four times a day (QID) | ORAL | 0 refills | Status: DC | PRN
Start: 2020-07-13 — End: 2020-08-04

## 2020-07-13 NOTE — Progress Notes (Signed)
Virtual Visit via Video Note  I connected with Tonya Hart on 07/13/20 at  4:00 PM EDT by a video enabled telemedicine application and verified that I am speaking with the correct person using two identifiers.   I discussed the limitations of evaluation and management by telemedicine and the availability of in person appointments. The patient expressed understanding and agreed to proceed.  Patient location: home Provider locations: office  Subjective:    CC: Upper respiratory symptoms  HPI: Pleasant 29 year old female presenting via MyChart video visit for 2 days of upper respiratory symptoms including body aches, headaches, fever T-max 101, fatigue, dizziness when standing, nausea/vomiting, cough productive of brown mucus, chest congestion, decreased appetite, sinus congestion, facial pain across the bridge of her nose, eye pressure, ears popping, sore throat, and postnasal drip.  She has eating and drinking small amounts.  Taking Zofran as needed for nausea with mild-moderate relief.  She was tested 5 days ago for Covid with negative results.  She has been taking over-the-counter sinus medications and using throat sprays/cough drops with very minimal relief.  Her husband was sick last week and was also tested for Covid with negative results.  Past medical history, Surgical history, Family history not pertinant except as noted below, Social history, Allergies, and medications have been entered into the medical record, reviewed, and corrections made.   Review of Systems: See HPI for pertinent positives and negatives.   Objective:    General: Speaking clearly in complete sentences without any shortness of breath.  Alert and oriented x3.  Normal judgment. No apparent acute distress.  Impression and Recommendations:    1. Viral URI Recommend Covid testing to verify negative result.  This is likely viral in nature and unfortunately will have to run its course.  Sending in promethazine DM to  help with cough and posttussive nausea.  Recommend Sudafed for upper respiratory congestion.  Okay to continue Mucinex if desired.  Tylenol/ibuprofen and throat sprays/lozenges as needed for fever and sore throat.  Increase p.o. fluids and rest.  Eat small amounts of softer foods as tolerated.  If no improvement in the next 5 days, we can consider adding prednisone 5-day burst but will avoid the added medication for now.  Return if symptoms worsen or fail to improve.  20 minutes of non-face-to-face time was provided during this encounter.  I discussed the assessment and treatment plan with the patient. The patient was provided an opportunity to ask questions and all were answered. The patient agreed with the plan and demonstrated an understanding of the instructions.   The patient was advised to call back or seek an in-person evaluation if the symptoms worsen or if the condition fails to improve as anticipated.  Thayer Ohm, DNP, APRN, FNP-BC [facility] and [facility] Medicine

## 2020-08-04 ENCOUNTER — Other Ambulatory Visit: Payer: Self-pay

## 2020-08-04 ENCOUNTER — Ambulatory Visit (INDEPENDENT_AMBULATORY_CARE_PROVIDER_SITE_OTHER): Payer: Self-pay | Admitting: Medical-Surgical

## 2020-08-04 ENCOUNTER — Encounter: Payer: Self-pay | Admitting: Medical-Surgical

## 2020-08-04 VITALS — BP 109/68 | HR 83 | Temp 98.2°F | Ht 68.0 in | Wt 198.1 lb

## 2020-08-04 DIAGNOSIS — Z3201 Encounter for pregnancy test, result positive: Secondary | ICD-10-CM

## 2020-08-04 DIAGNOSIS — N912 Amenorrhea, unspecified: Secondary | ICD-10-CM

## 2020-08-04 LAB — POCT URINE PREGNANCY: Preg Test, Ur: POSITIVE — AB

## 2020-08-04 NOTE — Progress Notes (Signed)
Subjective:    CC: Amenorrhea  HPI: Pleasant 29 year old female accompanied by her partner presenting with reports of amenorrhea.  She notes that she has had 2 inconclusive pregnancy test at home followed by 1+ pregnancy test yesterday.  She has had irregular.  For quite some time and we recently did a work-up where we discovered PCOS.  She was seen by OB/GYN who started her on Metformin.  Her last menstrual cycle started on July 31 and ended on August 5.  Per her ovulation app she was ovulating sometime between August 20 and August 25.  Over the last 5 days she has had severe nausea with vomiting and has been unable to tolerate foods and most liquids.  She has also been increasingly irritable with temperature shifts from hot to cold and back again.  She has had breast tenderness and had been very fatigued.  Has been taking Zofran every 8 hours as prescribed for nausea.  No recent doses of meloxicam as she was previously using this for abdominal pain near her periods.  I reviewed the past medical history, family history, social history, surgical history, and allergies today and no changes were needed.  Please see the problem list section below in epic for further details.  Past Medical History: Past Medical History:  Diagnosis Date  . Anxiety and depression    no current medications  . Asthma   . Environmental and seasonal allergies    Past Surgical History: Past Surgical History:  Procedure Laterality Date  . TONSILLECTOMY    . TONSILLECTOMY AND ADENOIDECTOMY     Social History: Social History   Socioeconomic History  . Marital status: Single    Spouse name: Not on file  . Number of children: Not on file  . Years of education: Not on file  . Highest education level: Not on file  Occupational History  . Occupation: ISS    Employer: [employer]  Tobacco Use  . Smoking status: Former Smoker    Types: Cigarettes  . Smokeless tobacco: Never Used  Vaping Use  . Vaping  Use: Every day  . Substances: Nicotine  Substance and Sexual Activity  . Alcohol use: Yes    Comment: 3 drinks/week, beer or liquor  . Drug use: Yes    Frequency: 7.0 times per week    Types: Marijuana  . Sexual activity: Yes    Partners: Male    Birth control/protection: None  Other Topics Concern  . Not on file  Social History Narrative  . Not on file   Social Determinants of Health   Financial Resource Strain:   . Difficulty of Paying Living Expenses: Not on file  Food Insecurity:   . Worried About Programme researcher, broadcasting/film/video in the Last Year: Not on file  . Ran Out of Food in the Last Year: Not on file  Transportation Needs:   . Lack of Transportation (Medical): Not on file  . Lack of Transportation (Non-Medical): Not on file  Physical Activity:   . Days of Exercise per Week: Not on file  . Minutes of Exercise per Session: Not on file  Stress:   . Feeling of Stress : Not on file  Social Connections:   . Frequency of Communication with Friends and Family: Not on file  . Frequency of Social Gatherings with Friends and Family: Not on file  . Attends Religious Services: Not on file  . Active Member of Clubs or Organizations: Not on file  . Attends Club  or Organization Meetings: Not on file  . Marital Status: Not on file   Family History: Family History  Problem Relation Age of Onset  . Thyroid disease Mother   . Hyperlipidemia Mother   . Hypertension Mother   . Cataracts Mother   . Hyperlipidemia Father   . COPD Father   . Heart attack Father   . Hypertension Father   . Diabetes Father   . Prostate cancer Maternal Grandfather    Allergies: Allergies  Allergen Reactions  . Chicken Meat (Diagnostic) Anaphylaxis  . Bee Venom Hives    swelling  . Latex Hives  . Soap Hives    Dove soap   Medications: See med rec.  Review of Systems: See HPI for pertinent positives and negatives.   Objective:    General: Well Developed, well nourished, and in no acute distress.   Neuro: Alert and oriented x3.  HEENT: Normocephalic, atraumatic.  Skin: Warm and dry. Cardiac: Regular rate and rhythm, no murmurs rubs or gallops, no lower extremity edema.  Respiratory: Clear to auscultation bilaterally. Not using accessory muscles, speaking in full sentences.  Impression and Recommendations:    1. Amenorrhea POCT UPT completed with positive result. - POCT urine pregnancy  2. Positive pregnancy test Using her ovulation estimate, she is likely around [redacted] weeks pregnant.  Discussed signs and symptoms that she may experience during the first trimester and ways to treat those safely.  Reviewed all of her medications for appropriate use during pregnancy.  Medications of concern include meloxicam, Zofran, and clonazepam.  Avoid meloxicam during pregnancy.  If nausea is severe and she is unable to hold down foods, recommend very sparing use of Zofran.  Discussed the risk of cleft palate and cleft lip with Zofran used during pregnancy.  Also discussed the use of marijuana during pregnancy.  As we have no definitive safety information regarding marijuana use and its effect on the fetus, recommend avoiding it.  Recommend contacting OB/GYN to notify them of her positive pregnancy and set up for routine prenatal care.  She is uninsured at this time.  Advised her partner to contact his insurance company regarding their policy for adding domestic partners.  List of safe medications provided with AVS.  Dietary information during pregnancy also provided with AVS.  Recommend starting a prenatal vitamin daily.  Advised patient to contact the office if she has any questions about the safety of of medication or food until she is able to get in with OB/GYN.  Return if symptoms worsen or fail to improve. ___________________________________________ Thayer Ohm, DNP, APRN, FNP-BC [facility] and Sports Medicine [facility] [place]

## 2020-08-04 NOTE — Patient Instructions (Signed)
Avoid ibuprofen and meloxicam  Ok to use Klonopin very sparingly  Start prenatal vitamin  Call OB to set up appointment for New OB  If you need dating [place], let me know and I will order it  Common Medications Safe in Pregnancy  Acne:      Constipation:  Benzoyl Peroxide     Colace  Clindamycin      Dulcolax Suppository  Topica Erythromycin     Fibercon  Salicylic Acid      Metamucil         Miralax AVOID:        Senakot   Accutane    Cough:  Retin-A       Cough Drops  Tetracycline      Phenergan w/ Codeine if Rx  Minocycline      Robitussin (Plain & DM)  Antibiotics:     Crabs/Lice:  Ceclor       RID  Cephalosporins    AVOID:  E-Mycins      Kwell  Keflex  Macrobid/Macrodantin   Diarrhea:  Penicillin      Kao-Pectate  Zithromax      Imodium AD         PUSH FLUIDS AVOID:       Cipro     Fever:  Tetracycline      Tylenol (Regular or Extra  Minocycline       Strength)  Levaquin      Extra Strength-Do not          Exceed 8 tabs/24 hrs Caffeine:        <232m/day (equiv. To 1 cup of coffee or  approx. 3 12 oz sodas)         Gas: Cold/Hayfever:       Gas-X  Benadryl      Mylicon  Claritin       Phazyme  **Claritin-D        Chlor-Trimeton    Headaches:  Dimetapp      ASA-Free Excedrin  Drixoral-Non-Drowsy     Cold Compress  Mucinex (Guaifenasin)     Tylenol (Regular or Extra  Sudafed/Sudafed-12 Hour     Strength)  **Sudafed PE Pseudoephedrine   Tylenol Cold & Sinus     Vicks Vapor Rub  Zyrtec  **AVOID if Problems With Blood Pressure         Heartburn: Avoid lying down for at least 1 hour after meals  Aciphex      Maalox     Rash:  Milk of Magnesia     Benadryl    Mylanta       1% Hydrocortisone Cream  Pepcid  Pepcid Complete   Sleep Aids:  Prevacid      Ambien   Prilosec       Benadryl  Rolaids       Chamomile Tea  Tums (Limit 4/day)     Unisom         Tylenol PM         Warm milk-add vanilla or  Hemorrhoids:       Sugar for taste  Anusol/Anusol  H.C.  (RX: Analapram 2.5%)  Sugar Substitutes:  Hydrocortisone OTC     Ok in moderation  Preparation H      Tucks        Vaseline lotion applied to tissue with wiping    Herpes:     Throat:  Acyclovir      Oragel  Famvir  Valtrex     Vaccines:  Flu Shot Leg Cramps:       *Gardasil  Benadryl      Hepatitis A         Hepatitis B Nasal Spray:       Pneumovax  Saline Nasal Spray     Polio Booster         Tetanus Nausea:       Tuberculosis test or PPD  Vitamin B6 25 mg TID   AVOID:    Dramamine      *Gardasil  Emetrol       Live Poliovirus  Ginger Root 250 mg QID    MMR (measles, mumps &  High Complex Carbs @ Bedtime    rebella)  Sea Bands-Accupressure    Varicella (Chickenpox)  Unisom 1/2 tab TID     *No known complications           If received before Pain:         Known pregnancy;   Darvocet       Resume series after  Lortab        Delivery  Percocet    Yeast:   Tramadol      Femstat  Tylenol 3      Gyne-lotrimin  Ultram       Monistat  Vicodin           MISC:         All Sunscreens           Hair Coloring/highlights          Insect Repellant's          (Including DEET)         Mystic Tans    Eating Plan for Pregnant Women While you are pregnant, your body requires additional nutrition to help support your growing baby. You also have a higher need for some vitamins and minerals, such as folic acid, calcium, iron, and vitamin D. Eating a healthy, well-balanced diet is very important for your health and your baby's health. Your need for extra calories varies for the three 37-monthsegments of your pregnancy (trimesters). For most women, it is recommended to consume:  150 extra calories a day during the first trimester.  300 extra calories a day during the second trimester.  300 extra calories a day during the third trimester. What are tips for following this plan?   Do not try to lose weight or go on a diet during pregnancy.  Limit your overall intake of  foods that have "empty calories." These are foods that have little nutritional value, such as sweets, desserts, candies, and sugar-sweetened beverages.  Eat a variety of foods (especially fruits and vegetables) to get a full range of vitamins and minerals.  Take a prenatal vitamin to help meet your additional vitamin and mineral needs during pregnancy, specifically for folic acid, iron, calcium, and vitamin D.  Remember to stay active. Ask your health care provider what types of exercise and activities are safe for you.  Practice good food safety and cleanliness. Wash your hands before you eat and after you prepare raw meat. Wash all fruits and vegetables well before peeling or eating. Taking these actions can help to prevent food-borne illnesses that can be very dangerous to your baby, such as listeriosis. Ask your health care provider for more information about listeriosis. What does 150 extra calories look like? Healthy options that provide 150 extra calories each day could be any of the following:  6-8 oz (170-230 g) of plain  low-fat yogurt with  cup of berries.  1 apple with 2 teaspoons (11 g) of peanut butter.  Cut-up vegetables with  cup (60 g) of hummus.  8 oz (230 mL) or 1 cup of low-fat chocolate milk.  1 stick of string cheese with 1 medium orange.  1 peanut butter and jelly sandwich that is made with one slice of whole-wheat bread and 1 tsp (5 g) of peanut butter. For 300 extra calories, you could eat two of those healthy options each day. What is a healthy amount of weight to gain? The right amount of weight gain for you is based on your BMI before you became pregnant. If your BMI:  Was less than 18 (underweight), you should gain 28-40 lb (13-18 kg).  Was 18-24.9 (normal), you should gain 25-35 lb (11-16 kg).  Was 25-29.9 (overweight), you should gain 15-25 lb (7-11 kg).  Was 30 or greater (obese), you should gain 11-20 lb (5-9 kg). What if I am having twins or  multiples? Generally, if you are carrying twins or multiples:  You may need to eat 300-600 extra calories a day.  The recommended range for total weight gain is 25-54 lb (11-25 kg), depending on your BMI before pregnancy.  Talk with your health care provider to find out about nutritional needs, weight gain, and exercise that is right for you. What foods can I eat?  Fruits All fruits. Eat a variety of colors and types of fruit. Remember to wash your fruits well before peeling or eating. Vegetables All vegetables. Eat a variety of colors and types of vegetables. Remember to wash your vegetables well before peeling or eating. Grains All grains. Choose whole grains, such as whole-wheat bread, oatmeal, or brown rice. Meats and other protein foods Lean meats, including chicken, [place], fish, and lean cuts of beef, veal, or pork. If you eat fish or seafood, choose options that are higher in omega-3 fatty acids and lower in mercury, such as salmon, herring, mussels, trout, sardines, pollock, shrimp, crab, and lobster. Tofu. Tempeh. Beans. Eggs. Peanut butter and other nut butters. Make sure that all meats, poultry, and eggs are cooked to food-safe temperatures or "well-done." Two or more servings of fish are recommended each week in order to get the most benefits from omega-3 fatty acids that are found in seafood. Choose fish that are lower in mercury. You can find more information online:  GuamGaming.ch Dairy Pasteurized milk and milk alternatives (such as almond milk). Pasteurized yogurt and pasteurized cheese. Cottage cheese. Sour cream. Beverages Water. Juices that contain 100% fruit juice or vegetable juice. Caffeine-free teas and decaffeinated coffee. Drinks that contain caffeine are okay to drink, but it is better to avoid caffeine. Keep your total caffeine intake to less than 200 mg each day (which is 12 oz or 355 mL of coffee, tea, or soda) or the limit as told by your health care  provider. Fats and oils Fats and oils are okay to include in moderation. Sweets and desserts Sweets and desserts are okay to include in moderation. Seasoning and other foods All pasteurized condiments. The items listed above may not be a complete list of foods and beverages you can eat. Contact a dietitian for more information. What foods are not recommended? Fruits Unpasteurized fruit juices. Vegetables Raw (unpasteurized) vegetable juices. Meats and other protein foods Lunch meats, bologna, hot dogs, or other deli meats. (If you must eat those meats, reheat them until they are steaming hot.) Refrigerated pat, meat spreads from a  meat counter, smoked seafood that is found in the refrigerated section of a store. Raw or undercooked meats, poultry, and eggs. Raw fish, such as sushi or sashimi. Fish that have high mercury content, such as tilefish, shark, swordfish, and king mackerel. To learn more about mercury in fish, talk with your health care provider or look for online resources, such as:  [place]Gaming.ch Dairy Raw (unpasteurized) milk and any foods that have raw milk in them. Soft cheeses, such as feta, queso blanco, queso fresco, Brie, Camembert cheeses, blue-veined cheeses, and Panela cheese (unless it is made with pasteurized milk, which must be stated on the label). Beverages Alcohol. Sugar-sweetened beverages, such as sodas, teas, or energy drinks. Seasoning and other foods Homemade fermented foods and drinks, such as pickles, sauerkraut, or kombucha drinks. (Store-bought pasteurized versions of these are okay.) Salads that are made in a store or deli, such as ham salad, chicken salad, egg salad, tuna salad, and seafood salad. The items listed above may not be a complete list of foods and beverages you should avoid. Contact a dietitian for more information. Where to find more information To calculate the number of calories you need based on your height, weight, and activity level,  you can use an online calculator such as:  MobileTransition.ch To calculate how much weight you should gain during pregnancy, you can use an online pregnancy weight gain calculator such as:  StreamingFood.com.cy Summary  While you are pregnant, your body requires additional nutrition to help support your growing baby.  Eat a variety of foods, especially fruits and vegetables to get a full range of vitamins and minerals.  Practice good food safety and cleanliness. Wash your hands before you eat and after you prepare raw meat. Wash all fruits and vegetables well before peeling or eating. Taking these actions can help to prevent food-borne illnesses, such as listeriosis, that can be very dangerous to your baby.  Do not eat raw meat or fish. Do not eat fish that have high mercury content, such as tilefish, shark, swordfish, and king mackerel. Do not eat unpasteurized (raw) dairy.  Take a prenatal vitamin to help meet your additional vitamin and mineral needs during pregnancy, specifically for folic acid, iron, calcium, and vitamin D. This information is not intended to replace advice given to you by your health care provider. Make sure you discuss any questions you have with your health care provider. Document Revised: 03/27/2019 Document Reviewed: 08/03/2017 Elsevier Patient Education  [place].

## 2020-08-09 ENCOUNTER — Encounter: Payer: Self-pay | Admitting: *Deleted

## 2020-08-09 ENCOUNTER — Telehealth: Payer: Self-pay | Admitting: *Deleted

## 2020-08-09 MED ORDER — DOXYLAMINE-PYRIDOXINE 10-10 MG PO TBEC: 1.0000 | DELAYED_RELEASE_TABLET | Freq: Two times a day (BID) | ORAL | 6 refills | Status: DC

## 2020-08-09 NOTE — Telephone Encounter (Signed)
Pt called stating that she is pregnant and having nausea.  She is requesting an antimetic  To help with the nausea.  She is seeing L Leftwich-Kirby,CNM for her NOB and per protocol Diclegis was sent to her pharmacy.

## 2020-08-26 ENCOUNTER — Encounter: Payer: Self-pay | Admitting: *Deleted

## 2020-08-26 NOTE — Progress Notes (Signed)
Last pap 5/21 NML

## 2020-08-31 ENCOUNTER — Ambulatory Visit (INDEPENDENT_AMBULATORY_CARE_PROVIDER_SITE_OTHER): Payer: Self-pay | Admitting: Advanced Practice Midwife

## 2020-08-31 ENCOUNTER — Other Ambulatory Visit: Payer: Self-pay

## 2020-08-31 ENCOUNTER — Encounter: Payer: Self-pay | Admitting: Advanced Practice Midwife

## 2020-08-31 VITALS — BP 124/82 | HR 88 | Wt 199.0 lb

## 2020-08-31 DIAGNOSIS — Z8659 Personal history of other mental and behavioral disorders: Secondary | ICD-10-CM

## 2020-08-31 DIAGNOSIS — O0990 Supervision of high risk pregnancy, unspecified, unspecified trimester: Secondary | ICD-10-CM

## 2020-08-31 DIAGNOSIS — Z3A10 10 weeks gestation of pregnancy: Secondary | ICD-10-CM

## 2020-08-31 DIAGNOSIS — Z3493 Encounter for supervision of normal pregnancy, unspecified, third trimester: Secondary | ICD-10-CM

## 2020-08-31 DIAGNOSIS — F418 Other specified anxiety disorders: Secondary | ICD-10-CM

## 2020-08-31 DIAGNOSIS — O219 Vomiting of pregnancy, unspecified: Secondary | ICD-10-CM

## 2020-08-31 NOTE — Patient Instructions (Signed)
First Trimester of Pregnancy The first trimester of pregnancy is from week 1 until the end of week 13 (months 1 through 3). A week after a sperm fertilizes an egg, the egg will implant on the wall of the uterus. This embryo will begin to develop into a baby. Genes from you and your partner will form the baby. The female genes will determine whether the baby will be a boy or a girl. At 6-8 weeks, the eyes and face will be formed, and the heartbeat can be seen on ultrasound. At the end of 12 weeks, all the baby's organs will be formed. Now that you are pregnant, you will want to do everything you can to have a healthy baby. Two of the most important things are to get good prenatal care and to follow your health care provider's instructions. Prenatal care is all the medical care you receive before the baby's birth. This care will help prevent, find, and treat any problems during the pregnancy and childbirth. Body changes during your first trimester Your body goes through many changes during pregnancy. The changes vary from woman to woman.  You may gain or lose a couple of pounds at first.  You may feel sick to your stomach (nauseous) and you may throw up (vomit). If the vomiting is uncontrollable, call your health care provider.  You may tire easily.  You may develop headaches that can be relieved by medicines. All medicines should be approved by your health care provider.  You may urinate more often. Painful urination may mean you have a bladder infection.  You may develop heartburn as a result of your pregnancy.  You may develop constipation because certain hormones are causing the muscles that push stool through your intestines to slow down.  You may develop hemorrhoids or swollen veins (varicose veins).  Your breasts may begin to grow larger and become tender. Your nipples may stick out more, and the tissue that surrounds them (areola) may become darker.  Your gums may bleed and may be  sensitive to brushing and flossing.  Dark spots or blotches (chloasma, mask of pregnancy) may develop on your face. This will likely fade after the baby is born.  Your menstrual periods will stop.  You may have a loss of appetite.  You may develop cravings for certain kinds of food.  You may have changes in your emotions from day to day, such as being excited to be pregnant or being concerned that something may go wrong with the pregnancy and baby.  You may have more vivid and strange dreams.  You may have changes in your hair. These can include thickening of your hair, rapid growth, and changes in texture. Some women also have hair loss during or after pregnancy, or hair that feels dry or thin. Your hair will most likely return to normal after your baby is born. What to expect at prenatal visits During a routine prenatal visit:  You will be weighed to make sure you and the baby are growing normally.  Your blood pressure will be taken.  Your abdomen will be measured to track your baby's growth.  The fetal heartbeat will be listened to between weeks 10 and 14 of your pregnancy.  Test results from any previous visits will be discussed. Your health care provider may ask you:  How you are feeling.  If you are feeling the baby move.  If you have had any abnormal symptoms, such as leaking fluid, bleeding, severe headaches, or abdominal   cramping.  If you are using any tobacco products, including cigarettes, chewing tobacco, and electronic cigarettes.  If you have any questions. Other tests that may be performed during your first trimester include:  Blood tests to find your blood type and to check for the presence of any previous infections. The tests will also be used to check for low iron levels (anemia) and protein on red blood cells (Rh antibodies). Depending on your risk factors, or if you previously had diabetes during pregnancy, you may have tests to check for high blood sugar  that affects pregnant women (gestational diabetes).  Urine tests to check for infections, diabetes, or protein in the urine.  An ultrasound to confirm the proper growth and development of the baby.  Fetal screens for spinal cord problems (spina bifida) and Down syndrome.  HIV (human immunodeficiency virus) testing. Routine prenatal testing includes screening for HIV, unless you choose not to have this test.  You may need other tests to make sure you and the baby are doing well. Follow these instructions at home: Medicines  Follow your health care provider's instructions regarding medicine use. Specific medicines may be either safe or unsafe to take during pregnancy.  Take a prenatal vitamin that contains at least 600 micrograms (mcg) of folic acid.  If you develop constipation, try taking a stool softener if your health care provider approves. Eating and drinking   Eat a balanced diet that includes fresh fruits and vegetables, whole grains, good sources of protein such as meat, eggs, or tofu, and low-fat dairy. Your health care provider will help you determine the amount of weight gain that is right for you.  Avoid raw meat and uncooked cheese. These carry germs that can cause birth defects in the baby.  Eating four or five small meals rather than three large meals a day may help relieve nausea and vomiting. If you start to feel nauseous, eating a few soda crackers can be helpful. Drinking liquids between meals, instead of during meals, also seems to help ease nausea and vomiting.  Limit foods that are high in fat and processed sugars, such as fried and sweet foods.  To prevent constipation: ? Eat foods that are high in fiber, such as fresh fruits and vegetables, whole grains, and beans. ? Drink enough fluid to keep your urine clear or pale yellow. Activity  Exercise only as directed by your health care provider. Most women can continue their usual exercise routine during  pregnancy. Try to exercise for 30 minutes at least 5 days a week. Exercising will help you: ? Control your weight. ? Stay in shape. ? Be prepared for labor and delivery.  Experiencing pain or cramping in the lower abdomen or lower back is a good sign that you should stop exercising. Check with your health care provider before continuing with normal exercises.  Try to avoid standing for long periods of time. Move your legs often if you must stand in one place for a long time.  Avoid heavy lifting.  Wear low-heeled shoes and practice good posture.  You may continue to have sex unless your health care provider tells you not to. Relieving pain and discomfort  Wear a good support bra to relieve breast tenderness.  Take warm sitz baths to soothe any pain or discomfort caused by hemorrhoids. Use hemorrhoid cream if your health care provider approves.  Rest with your legs elevated if you have leg cramps or low back pain.  If you develop varicose veins in   your legs, wear support hose. Elevate your feet for 15 minutes, 3-4 times a day. Limit salt in your diet. Prenatal care  Schedule your prenatal visits by the twelfth week of pregnancy. They are usually scheduled monthly at first, then more often in the last 2 months before delivery.  Write down your questions. Take them to your prenatal visits.  Keep all your prenatal visits as told by your health care provider. This is important. Safety  Wear your seat belt at all times when driving.  Make a list of emergency phone numbers, including numbers for family, friends, the hospital, and police and fire departments. General instructions  Ask your health care provider for a referral to a local prenatal education class. Begin classes no later than the beginning of month 6 of your pregnancy.  Ask for help if you have counseling or nutritional needs during pregnancy. Your health care provider can offer advice or refer you to specialists for help  with various needs.  Do not use hot tubs, steam rooms, or saunas.  Do not douche or use tampons or scented sanitary pads.  Do not cross your legs for long periods of time.  Avoid cat litter boxes and soil used by cats. These carry germs that can cause birth defects in the baby and possibly loss of the fetus by miscarriage or stillbirth.  Avoid all smoking, herbs, alcohol, and medicines not prescribed by your health care provider. Chemicals in these products affect the formation and growth of the baby.  Do not use any products that contain nicotine or tobacco, such as cigarettes and e-cigarettes. If you need help quitting, ask your health care provider. You may receive counseling support and other resources to help you quit.  Schedule a dentist appointment. At home, brush your teeth with a soft toothbrush and be gentle when you floss. Contact a health care provider if:  You have dizziness.  You have mild pelvic cramps, pelvic pressure, or nagging pain in the abdominal area.  You have persistent nausea, vomiting, or diarrhea.  You have a bad smelling vaginal discharge.  You have pain when you urinate.  You notice increased swelling in your face, hands, legs, or ankles.  You are exposed to fifth disease or chickenpox.  You are exposed to German measles (rubella) and have never had it. Get help right away if:  You have a fever.  You are leaking fluid from your vagina.  You have spotting or bleeding from your vagina.  You have severe abdominal cramping or pain.  You have rapid weight gain or loss.  You vomit blood or material that looks like coffee grounds.  You develop a severe headache.  You have shortness of breath.  You have any kind of trauma, such as from a fall or a car accident. Summary  The first trimester of pregnancy is from week 1 until the end of week 13 (months 1 through 3).  Your body goes through many changes during pregnancy. The changes vary from  woman to woman.  You will have routine prenatal visits. During those visits, your health care provider will examine you, discuss any test results you may have, and talk with you about how you are feeling. This information is not intended to replace advice given to you by your health care provider. Make sure you discuss any questions you have with your health care provider. Document Revised: 10/19/2017 Document Reviewed: 10/18/2016 Elsevier Patient Education  2020 [employer]  

## 2020-08-31 NOTE — Progress Notes (Signed)
Bedside U/S shows single IUP with FHT 184 BPM and CRL measures 31.51 GA 10w

## 2020-08-31 NOTE — Progress Notes (Signed)
Subjective:   Tonya Hart is a 29 y.o. G1P0000 at [redacted]w[redacted]d by LMP being seen today for her first obstetrical visit.  Her obstetrical history is significant for anxiety and depression, stopped medications due to pregnancy and has Cigarette nicotine dependence without complication; Depression with anxiety; Anxiety state; Dysmenorrhea; Positive pregnancy test; and Supervision of high risk pregnancy, antepartum on their problem list.. Patient does intend to breast feed. Pregnancy history fully reviewed.  Patient reports nausea.  HISTORY: OB History  Gravida Para Term Preterm AB Living  1 0 0 0 0 0  SAB TAB Ectopic Multiple Live Births  0 0 0 0 0    # Outcome Date GA Lbr Len/2nd Weight Sex Delivery Anes PTL Lv  1 Current            Past Medical History:  Diagnosis Date  . Anxiety and depression    no current medications  . Asthma   . Endometriosis   . Environmental and seasonal allergies   . PCOS (polycystic ovarian syndrome)   . Seasonal allergies    Past Surgical History:  Procedure Laterality Date  . TONSILLECTOMY    . TONSILLECTOMY AND ADENOIDECTOMY     Family History  Problem Relation Age of Onset  . Thyroid disease Mother   . Hyperlipidemia Mother   . Hypertension Mother   . Cataracts Mother   . Hyperlipidemia Father   . COPD Father   . Heart attack Father   . Hypertension Father   . Diabetes Father   . Prostate cancer Maternal Grandfather    Social History   Tobacco Use  . Smoking status: Former Smoker    Types: Cigarettes  . Smokeless tobacco: Never Used  Vaping Use  . Vaping Use: Every day  . Substances: Nicotine  Substance Use Topics  . Alcohol use: Not Currently    Comment: 3 drinks/week, beer or liquor  . Drug use: Yes    Frequency: 7.0 times per week    Types: Marijuana   Allergies  Allergen Reactions  . Chicken Meat (Diagnostic) Anaphylaxis  . Bee Venom Hives    swelling  . Latex Hives  . Soap Hives    Dove soap   Current Outpatient  Medications on File Prior to Visit  Medication Sig Dispense Refill  . Doxylamine-Pyridoxine (DICLEGIS) 10-10 MG TBEC Take 1 tablet by mouth in the morning and at bedtime. 60 tablet 6  . Prenatal Vit-Fe Fumarate-FA (MULTIVITAMIN-PRENATAL) 27-0.8 MG TABS tablet Take 1 tablet by mouth daily at 12 noon.    . busPIRone (BUSPAR) 10 MG tablet TAKE 1 TABLET (10 MG TOTAL) BY MOUTH 2 (TWO) TIMES DAILY AS NEEDED. (Patient not taking: Reported on 08/31/2020) 180 tablet 1  . clonazePAM (KLONOPIN) 0.5 MG tablet Take 1 tablet (0.5 mg total) by mouth daily as needed for anxiety. (Patient not taking: Reported on 08/31/2020) 30 tablet 0  . EPINEPHrine 0.3 mg/0.3 mL IJ SOAJ injection SMARTSIG:0.3 Milliliter(s) IM Once PRN (Patient not taking: Reported on 08/31/2020)    . escitalopram (LEXAPRO) 20 MG tablet TAKE 1 TABLET BY MOUTH EVERY DAY (Patient not taking: Reported on 08/31/2020) 90 tablet 1  . meloxicam (MOBIC) 15 MG tablet TAKE 1 TABLET (15 MG TOTAL) BY MOUTH DAILY. AVOID TAKING OTHER NSAIDS (IBUPROFEN, ALEVE, MOTRIN, ETC (Patient not taking: Reported on 08/31/2020) 30 tablet 0  . metFORMIN (GLUCOPHAGE) 500 MG tablet Take one tablet by mouth daily for one week. Then increase to one tablet twice a day for one  week.  Then two tablets twice a day. (Patient not taking: Reported on 08/31/2020) 60 tablet 5  . ondansetron (ZOFRAN-ODT) 8 MG disintegrating tablet Take 1 tablet (8 mg total) by mouth every 8 (eight) hours as needed for nausea. (Patient not taking: Reported on 08/31/2020) 20 tablet 3   No current facility-administered medications on file prior to visit.     Indications for ASA therapy (per uptodate) One of the following: Previous pregnancy with preeclampsia, especially early onset and with an adverse outcome No Multifetal gestation No Chronic hypertension No Type 1 or 2 diabetes mellitus No Chronic kidney disease No Autoimmune disease (antiphospholipid syndrome, systemic lupus erythematosus) No   Two  or more of the following: Nulliparity Yes Obesity (body mass index >30 kg/m2) No Family history of preeclampsia in mother or sister No Age ?35 years No Sociodemographic characteristics (African American race, low socioeconomic level) No Personal risk factors (eg, previous pregnancy with low birth weight or small for gestational age infant, previous adverse pregnancy outcome [eg, stillbirth], interval >10 years between pregnancies) No   Indications for early 1 hour GTT (per uptodate)  BMI >25 (>23 in Asian women) AND one of the following  Gestational diabetes mellitus in a previous pregnancy No Glycated hemoglobin ?5.7 percent (39 mmol/mol), impaired glucose tolerance, or impaired fasting glucose on previous testing No First-degree relative with diabetes No High-risk race/ethnicity (eg, African American, Latino, Native American, [place] American, Pacific Islander) No History of cardiovascular disease No Hypertension or on therapy for hypertension No High-density lipoprotein cholesterol level <35 mg/dL (4.33 mmol/L) and/or a triglyceride level >250 mg/dL (2.95 mmol/L) No Polycystic ovary syndrome No Physical inactivity No Other clinical condition associated with insulin resistance (eg, severe obesity, acanthosis nigricans) No Previous birth of an infant weighing ?4000 g No Previous stillbirth of unknown cause No Exam   Vitals:   08/31/20 1415  BP: 124/82  Pulse: 88  Weight: 199 lb (90.3 kg)   Fetal Heart Rate (bpm): 184  Uterus:     Pelvic Exam: Perineum: no hemorrhoids, normal perineum   Vulva: normal external genitalia, no lesions   Vagina:  normal mucosa, normal discharge   Cervix: no lesions and normal, pap smear done.    Adnexa: normal adnexa and no mass, fullness, tenderness   Bony Pelvis: average  System: General: well-developed, well-nourished female in no acute distress   Breast:  normal appearance, no masses or tenderness   Skin: normal coloration and turgor, no  rashes   Neurologic: oriented, normal, negative, normal mood   Extremities: normal strength, tone, and muscle mass, ROM of all joints is normal   HEENT PERRLA, extraocular movement intact and sclera clear, anicteric   Mouth/Teeth mucous membranes moist, pharynx normal without lesions and dental hygiene good   Neck supple and no masses   Cardiovascular: regular rate and rhythm   Respiratory:  no respiratory distress, normal breath sounds   Abdomen: soft, non-tender; bowel sounds normal; no masses,  no organomegaly     Assessment:   Pregnancy: G1P0000 Patient Active Problem List   Diagnosis Date Noted  . Supervision of high risk pregnancy, antepartum 09/01/2020  . Positive pregnancy test 08/04/2020  . Dysmenorrhea 05/18/2020  . Depression with anxiety 04/04/2020  . Anxiety state 04/04/2020  . Cigarette nicotine dependence without complication 01/04/2018     Plan:  1. [redacted] weeks gestation of pregnancy  - Obstetric panel - Hepatitis C Antibody - Hemoglobinopathy Evaluation - Culture, OB Urine - GC/Chlamydia probe amp ([place])not at [facility]  [place] bedside; Future  2. Nausea and vomiting during pregnancy prior to [redacted] weeks gestation --Well controlled on Diclegis.  May take 4 tabs/day, Rx updated.  3. Supervision of low-risk pregnancy, third trimester --Anticipatory guidance about next visits/weeks of pregnancy given. --Next visit in 5 weeks for AFP.  All labs held today as pt insurance to start with new job in a few weeks.   4. Personal history of anxiety disorder --Pt reports she is doing well right now off of her anxiety/depression medications. I reviewed relative safety of medications in pregnancy and pt to decide if she wants to restart at later time. Offered counseling, but pt reports she is doing well currently. Discussed increased risks of PP depression/anxiety. Will continue to reevaluate.   Continue prenatal vitamins. Discussed and offered genetic screening options,  including Quad screen/AFP, NIPS testing, and option to decline testing. Benefits/risks/alternatives reviewed. Pt aware that anatomy US is form of genetic screening with lower accuracy in detecting trisomies than blood work.  Pt chooses genetic screening today. NIPS: requested. Ultrasound discussed; fetal anatomic survey: requested. Problem list reviewed and updated. The nature of [facility] with multiple MDs and other Advanced Practice Providers was explained to patient; also emphasized that residents, students are part of our team. Routine obstetric precautions reviewed. Return in about 5 weeks (around 10/05/2020).   Sharen Counter, CNM 09/01/20 8:46 AM

## 2020-09-01 DIAGNOSIS — Z3400 Encounter for supervision of normal first pregnancy, unspecified trimester: Secondary | ICD-10-CM | POA: Insufficient documentation

## 2020-09-01 DIAGNOSIS — O0990 Supervision of high risk pregnancy, unspecified, unspecified trimester: Secondary | ICD-10-CM | POA: Insufficient documentation

## 2020-09-01 MED ORDER — DOXYLAMINE-PYRIDOXINE 10-10 MG PO TBEC: DELAYED_RELEASE_TABLET | ORAL | 6 refills | Status: DC

## 2020-09-05 ENCOUNTER — Other Ambulatory Visit: Payer: Self-pay | Admitting: Medical-Surgical

## 2020-09-05 DIAGNOSIS — F418 Other specified anxiety disorders: Secondary | ICD-10-CM

## 2020-10-05 ENCOUNTER — Ambulatory Visit (INDEPENDENT_AMBULATORY_CARE_PROVIDER_SITE_OTHER): Payer: Medicaid Other | Admitting: Certified Nurse Midwife

## 2020-10-05 ENCOUNTER — Other Ambulatory Visit: Payer: Self-pay

## 2020-10-05 ENCOUNTER — Other Ambulatory Visit (HOSPITAL_COMMUNITY)
Admission: RE | Admit: 2020-10-05 | Discharge: 2020-10-05 | Disposition: A | Discharge status: Home / Self Care | Payer: Medicaid Other | Source: Ambulatory Visit | Attending: Certified Nurse Midwife | Admitting: Certified Nurse Midwife

## 2020-10-05 ENCOUNTER — Encounter: Payer: Self-pay | Admitting: Certified Nurse Midwife

## 2020-10-05 VITALS — BP 114/71 | HR 88 | Wt 203.0 lb

## 2020-10-05 DIAGNOSIS — Z3400 Encounter for supervision of normal first pregnancy, unspecified trimester: Secondary | ICD-10-CM

## 2020-10-05 DIAGNOSIS — Z3A15 15 weeks gestation of pregnancy: Secondary | ICD-10-CM | POA: Diagnosis not present

## 2020-10-05 DIAGNOSIS — Z31430 Encounter of female for testing for genetic disease carrier status for procreative management: Secondary | ICD-10-CM | POA: Diagnosis not present

## 2020-10-05 DIAGNOSIS — Z3482 Encounter for supervision of other normal pregnancy, second trimester: Secondary | ICD-10-CM | POA: Diagnosis not present

## 2020-10-05 DIAGNOSIS — F418 Other specified anxiety disorders: Secondary | ICD-10-CM

## 2020-10-05 NOTE — Patient Instructions (Signed)
Second Trimester of Pregnancy  The second trimester is from week 14 through week 27 (month 4 through 6). This is often the time in pregnancy that you feel your best. Often times, morning sickness has lessened or quit. You may have more energy, and you may get hungry more often. Your unborn baby is growing rapidly. At the end of the sixth month, he or she is about 9 inches long and weighs about 1 pounds. You will likely feel the baby move between 18 and 20 weeks of pregnancy. Follow these instructions at home: Medicines  Take over-the-counter and prescription medicines only as told by your doctor. Some medicines are safe and some medicines are not safe during pregnancy.  Take a prenatal vitamin that contains at least 600 micrograms (mcg) of folic acid.  If you have trouble pooping (constipation), take medicine that will make your stool soft (stool softener) if your doctor approves. Eating and drinking   Eat regular, healthy meals.  Avoid raw meat and uncooked cheese.  If you get low calcium from the food you eat, talk to your doctor about taking a daily calcium supplement.  Avoid foods that are high in fat and sugars, such as fried and sweet foods.  If you feel sick to your stomach (nauseous) or throw up (vomit): ? Eat 4 or 5 small meals a day instead of 3 large meals. ? Try eating a few soda crackers. ? Drink liquids between meals instead of during meals.  To prevent constipation: ? Eat foods that are high in fiber, like fresh fruits and vegetables, whole grains, and beans. ? Drink enough fluids to keep your pee (urine) clear or pale yellow. Activity  Exercise only as told by your doctor. Stop exercising if you start to have cramps.  Do not exercise if it is too hot, too humid, or if you are in a place of great height (high altitude).  Avoid heavy lifting.  Wear low-heeled shoes. Sit and stand up straight.  You can continue to have sex unless your doctor tells you not  to. Relieving pain and discomfort  Wear a good support bra if your breasts are tender.  Take warm water baths (sitz baths) to soothe pain or discomfort caused by hemorrhoids. Use hemorrhoid cream if your doctor approves.  Rest with your legs raised if you have leg cramps or low back pain.  If you develop puffy, bulging veins (varicose veins) in your legs: ? Wear support hose or compression stockings as told by your doctor. ? Raise (elevate) your feet for 15 minutes, 3-4 times a day. ? Limit salt in your food. Prenatal care  Write down your questions. Take them to your prenatal visits.  Keep all your prenatal visits as told by your doctor. This is important. Safety  Wear your seat belt when driving.  Make a list of emergency phone numbers, including numbers for family, friends, the hospital, and police and fire departments. General instructions  Ask your doctor about the right foods to eat or for help finding a counselor, if you need these services.  Ask your doctor about local prenatal classes. Begin classes before month 6 of your pregnancy.  Do not use hot tubs, steam rooms, or saunas.  Do not douche or use tampons or scented sanitary pads.  Do not cross your legs for long periods of time.  Visit your dentist if you have not done so. Use a soft toothbrush to brush your teeth. Floss gently.  Avoid all smoking, herbs,   and alcohol. Avoid drugs that are not approved by your doctor.  Do not use any products that contain nicotine or tobacco, such as cigarettes and e-cigarettes. If you need help quitting, ask your doctor.  Avoid cat litter boxes and soil used by cats. These carry germs that can cause birth defects in the baby and can cause a loss of your baby (miscarriage) or stillbirth. Contact a doctor if:  You have mild cramps or pressure in your lower belly.  You have pain when you pee (urinate).  You have bad smelling fluid coming from your vagina.  You continue to  feel sick to your stomach (nauseous), throw up (vomit), or have watery poop (diarrhea).  You have a nagging pain in your belly area.  You feel dizzy. Get help right away if:  You have a fever.  You are leaking fluid from your vagina.  You have spotting or bleeding from your vagina.  You have severe belly cramping or pain.  You lose or gain weight rapidly.  You have trouble catching your breath and have chest pain.  You notice sudden or extreme puffiness (swelling) of your face, hands, ankles, feet, or legs.  You have not felt the baby move in over an hour.  You have severe headaches that do not go away when you take medicine.  You have trouble seeing. Summary  The second trimester is from week 14 through week 27 (months 4 through 6). This is often the time in pregnancy that you feel your best.  To take care of yourself and your unborn baby, you will need to eat healthy meals, take medicines only if your doctor tells you to do so, and do activities that are safe for you and your baby.  Call your doctor if you get sick or if you notice anything unusual about your pregnancy. Also, call your doctor if you need help with the right food to eat, or if you want to know what activities are safe for you. This information is not intended to replace advice given to you by your health care provider. Make sure you discuss any questions you have with your health care provider. Document Revised: 02/28/2019 Document Reviewed: 12/12/2016 Elsevier Patient Education  2020 [employer]  Safe Medications in Pregnancy   Acne: Benzoyl Peroxide Salicylic Acid  Backache/Headache: Tylenol: 2 regular strength every 4 hours OR              2 Extra strength every 6 hours  Colds/Coughs/Allergies: Benadryl (alcohol free) 25 mg every 6 hours as needed Breath right strips Claritin Cepacol throat lozenges Chloraseptic throat spray Cold-Eeze- up to three times per day Cough drops, alcohol  free Flonase (by prescription only) Guaifenesin Mucinex Robitussin DM (plain only, alcohol free) Saline nasal spray/drops Sudafed (pseudoephedrine) & Actifed ** use only after [redacted] weeks gestation and if you do not have high blood pressure Tylenol Vicks Vaporub Zinc lozenges Zyrtec   Constipation: Colace Ducolax suppositories Fleet enema Glycerin suppositories Metamucil Milk of magnesia Miralax Senokot Smooth move tea  Diarrhea: Kaopectate Imodium A-D  *NO pepto Bismol  Hemorrhoids: Anusol Anusol HC Preparation H Tucks  Indigestion: Tums Maalox Mylanta Zantac  Pepcid  Insomnia: Benadryl (alcohol free) 25mg every 6 hours as needed Tylenol PM Unisom, no Gelcaps  Leg Cramps: Tums MagGel  Nausea/Vomiting:  Bonine Dramamine Emetrol Ginger extract Sea bands Meclizine  Nausea medication to take during pregnancy:  Unisom (doxylamine succinate 25 mg tablets) Take one tablet daily at bedtime. If symptoms are   not adequately controlled, the dose can be increased to a maximum recommended dose of two tablets daily (1/2 tablet in the morning, 1/2 tablet mid-afternoon and one at bedtime). Vitamin B6 100mg tablets. Take one tablet twice a day (up to 200 mg per day).  Skin Rashes: Aveeno products Benadryl cream or 25mg every 6 hours as needed Calamine Lotion 1% cortisone cream  Yeast infection: Gyne-lotrimin 7 Monistat 7   **If taking multiple medications, please check labels to avoid duplicating the same active ingredients **take medication as directed on the label ** Do not exceed 4000 mg of tylenol in 24 hours **Do not take medications that contain aspirin or ibuprofen     

## 2020-10-05 NOTE — Progress Notes (Signed)
   PRENATAL VISIT NOTE  Subjective:  Tonya Hart is a 29 y.o. G1P0000 at [redacted]w[redacted]d being seen today for ongoing prenatal care.  She is currently monitored for the following issues for this low-risk pregnancy and has Cigarette nicotine dependence without complication; Depression with anxiety; Anxiety state; Dysmenorrhea; Positive pregnancy test; and Supervision of normal first pregnancy on their problem list.  Patient reports no complaints.  Contractions: Not present. Vag. Bleeding: None.  Movement: Absent. Denies leaking of fluid.   The following portions of the patient's history were reviewed and updated as appropriate: allergies, current medications, past family history, past medical history, past social history, past surgical history and problem list.   Objective:   Vitals:   10/05/20 1507  BP: 114/71  Pulse: 88  Weight: 203 lb (92.1 kg)    Fetal Status: Fetal Heart Rate (bpm): 162   Movement: Absent     General:  Alert, oriented and cooperative. Patient is in no acute distress.  Skin: Skin is warm and dry. No rash noted.   Cardiovascular: Normal heart rate noted  Respiratory: Normal respiratory effort, no problems with respiration noted  Abdomen: Soft, gravid, appropriate for gestational age.  Pain/Pressure: Absent     Pelvic: Cervical exam deferred        Extremities: Normal range of motion.  Edema: None  Mental Status: Normal mood and affect. Normal behavior. Normal judgment and thought content.   Assessment and Plan:  Pregnancy: G1P0000 at [redacted]w[redacted]d 1. Supervision of normal first pregnancy, antepartum - Patient doing well - Routine prenatal care  - Anticipatory guidance on upcoming appointments  - Educated and discussed what to expect in second trimester with RLP and fetal movement  - Patient opted into genetic screening at this time, panorama and AFP collected - Obstetric panel - HIV antibody (with reflex) - Hemoglobinopathy Evaluation - Hepatitis C Antibody - Culture, OB  Urine - GC/Chlamydia probe amp (Success)not at [facility] - [place] MFM OB COMP + 14 WK; Future  2. Depression with anxiety - currently not on any medication  - patient reports coping well  - encouraged to call office if she feel medication is needed   3. [redacted] weeks gestation of pregnancy - Alpha fetoprotein, maternal - Babyscripts Schedule Optimization - Genetic Screening  Preterm labor symptoms and general obstetric precautions including but not limited to vaginal bleeding, contractions, leaking of fluid and fetal movement were reviewed in detail with the patient. Please refer to After Visit Summary for other counseling recommendations.   Return in about 5 weeks (around 11/09/2020) for LROB, in person.  Future Appointments  Date Time Provider Department Center  11/03/2020  2:45 PM [facility] US4 [facility]  11/09/2020  3:10 PM Rasch, [name], NP [facility]    Sharyon Cable, CNM

## 2020-10-06 LAB — ALPHA FETOPROTEIN, MATERNAL
AFP MoM: 0.76
AFP, Serum: 19.4 ng/mL
Calc'd Gestational Age: 15.4 weeks
Maternal Wt: 203 [lb_av]
Risk for ONTD: <1
Twins-AFP: 1

## 2020-10-06 LAB — GC/CHLAMYDIA PROBE AMP (~~LOC~~) NOT AT ARMC
Chlamydia: NEGATIVE
Comment: NEGATIVE
Comment: NORMAL
Neisseria Gonorrhea: NEGATIVE

## 2020-10-07 LAB — HEMOGLOBINOPATHY EVALUATION
Fetal Hemoglobin Testing: <1 % (ref 0.0–1.9)
HCT: 36.8 % (ref 35.0–45.0)
Hemoglobin A2 - HGBRFX: 2.6 % (ref 2.2–3.2)
Hemoglobin: 12.2 g/dL (ref 11.7–15.5)
Hgb A: 97.4 % (ref >96.0)
MCH: 32.2 pg (ref 27.0–33.0)
MCV: 97.1 fL (ref 80.0–100.0)
RBC: 3.79 10*6/uL — ABNORMAL LOW (ref 3.80–5.10)
RDW: 12.2 % (ref 11.0–15.0)

## 2020-10-07 LAB — OBSTETRIC PANEL
Absolute Monocytes: 616 cells/uL (ref 200–950)
Antibody Screen: NOT DETECTED
Basophils Absolute: 32 cells/uL (ref 0–200)
Basophils Relative: 0.4 %
Eosinophils Absolute: 48 cells/uL (ref 15–500)
Eosinophils Relative: 0.6 %
HCT: 35.2 % (ref 35.0–45.0)
Hemoglobin: 12.3 g/dL (ref 11.7–15.5)
Hepatitis B Surface Ag: NONREACTIVE
Lymphs Abs: 1576 cells/uL (ref 850–3900)
MCH: 33 pg (ref 27.0–33.0)
MCHC: 34.9 g/dL (ref 32.0–36.0)
MCV: 94.4 fL (ref 80.0–100.0)
MPV: 11.7 fL (ref 7.5–12.5)
Monocytes Relative: 7.7 %
Neutro Abs: 5728 cells/uL (ref 1500–7800)
Neutrophils Relative %: 71.6 %
Platelets: 210 10*3/uL (ref 140–400)
RBC: 3.73 10*6/uL — ABNORMAL LOW (ref 3.80–5.10)
RDW: 11.9 % (ref 11.0–15.0)
RPR Ser Ql: NONREACTIVE
Rubella: 1.44 Index
Total Lymphocyte: 19.7 %
WBC: 8 10*3/uL (ref 3.8–10.8)

## 2020-10-07 LAB — HEPATITIS C ANTIBODY
Hepatitis C Ab: NONREACTIVE
SIGNAL TO CUT-OFF: 0.01 (ref <1.00)

## 2020-10-07 LAB — CULTURE, OB URINE

## 2020-10-07 LAB — URINE CULTURE, OB REFLEX

## 2020-10-07 LAB — HIV ANTIBODY (ROUTINE TESTING W REFLEX): HIV 1&2 Ab, 4th Generation: NONREACTIVE

## 2020-10-13 ENCOUNTER — Encounter: Payer: Self-pay | Admitting: *Deleted

## 2020-10-18 ENCOUNTER — Encounter: Payer: Self-pay | Admitting: *Deleted

## 2020-10-18 DIAGNOSIS — Z3400 Encounter for supervision of normal first pregnancy, unspecified trimester: Secondary | ICD-10-CM

## 2020-11-03 ENCOUNTER — Ambulatory Visit: Payer: Medicaid Other | Attending: Certified Nurse Midwife

## 2020-11-03 ENCOUNTER — Other Ambulatory Visit: Payer: Self-pay | Admitting: Certified Nurse Midwife

## 2020-11-03 ENCOUNTER — Other Ambulatory Visit: Payer: Self-pay

## 2020-11-03 DIAGNOSIS — Z3400 Encounter for supervision of normal first pregnancy, unspecified trimester: Secondary | ICD-10-CM

## 2020-11-03 IMAGING — US US MFM OB DETAIL+14 WK
1 series · 12 of 28 positions shown · non-contrast
Comparison: none

[Series 1: us mfm ob detail+14 wk · 12 of 135 slices shown]
[im 5/135]
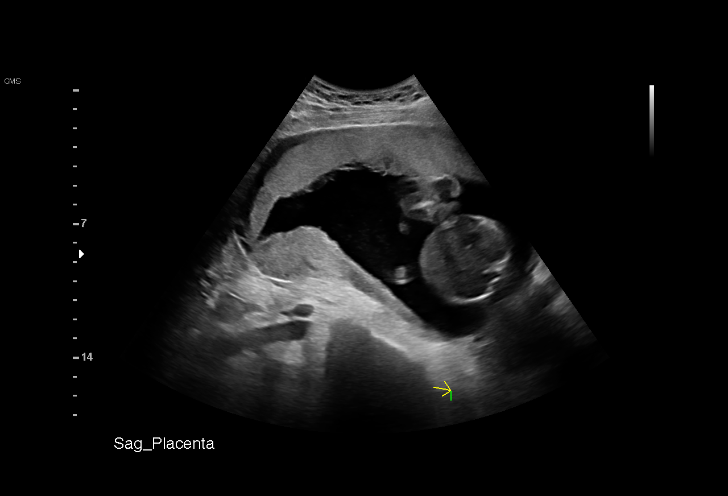
[im 15/135]
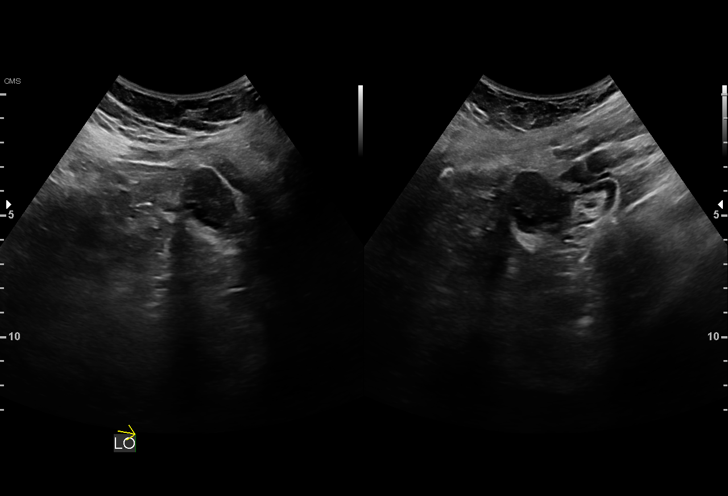
[im 25/135]
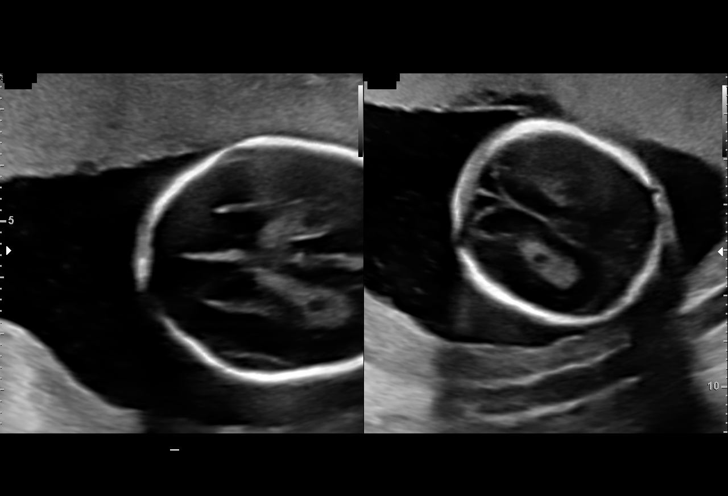
[im 40/135]
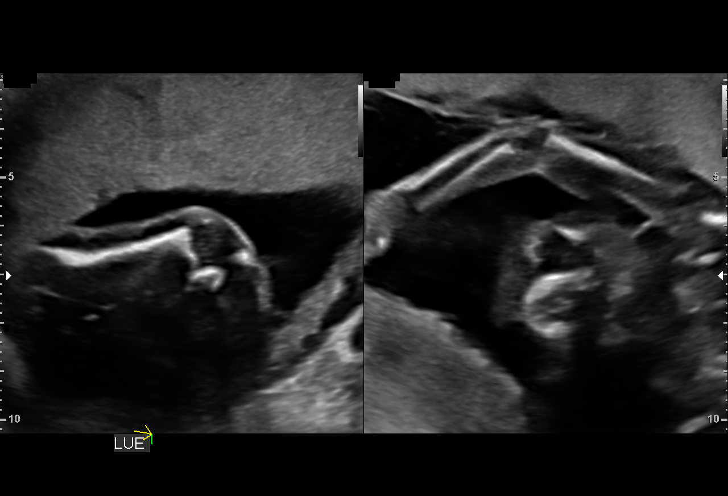
[im 50/135]
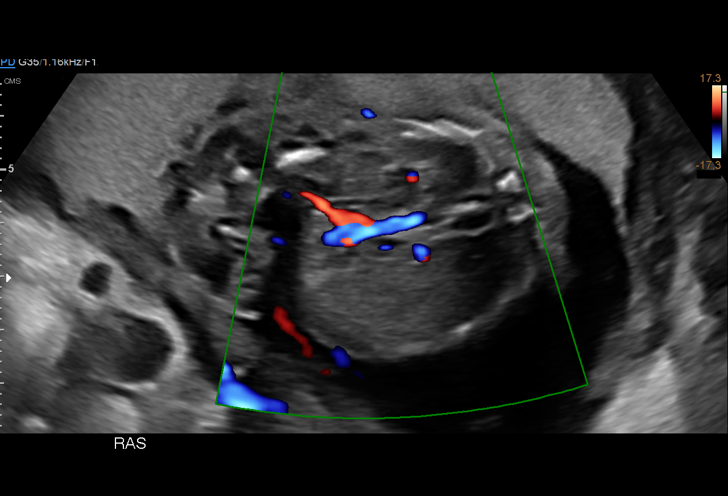
[im 60/135]
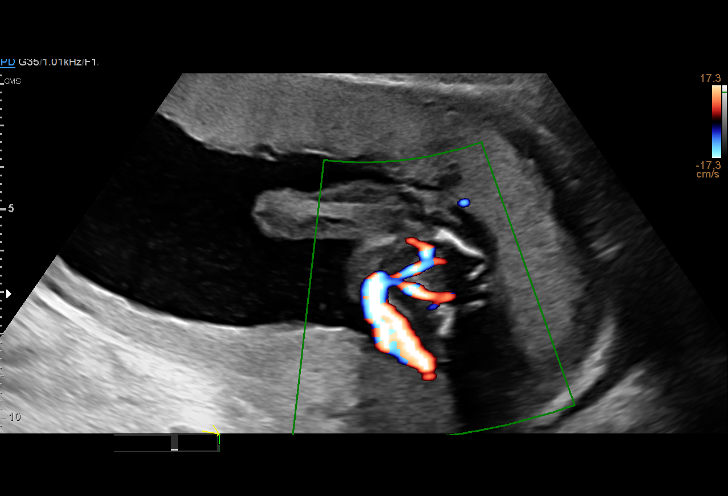
[im 75/135]
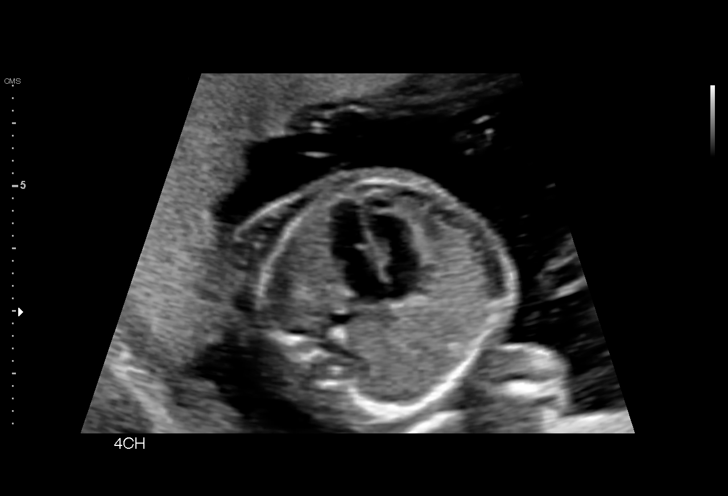
[im 85/135]
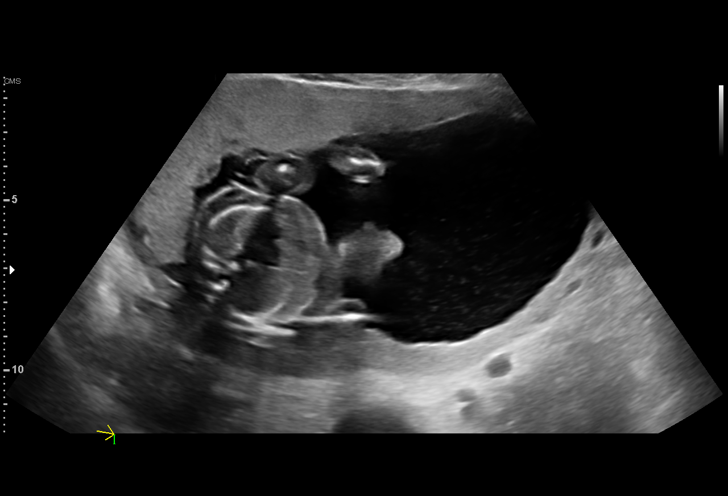
[im 95/135]
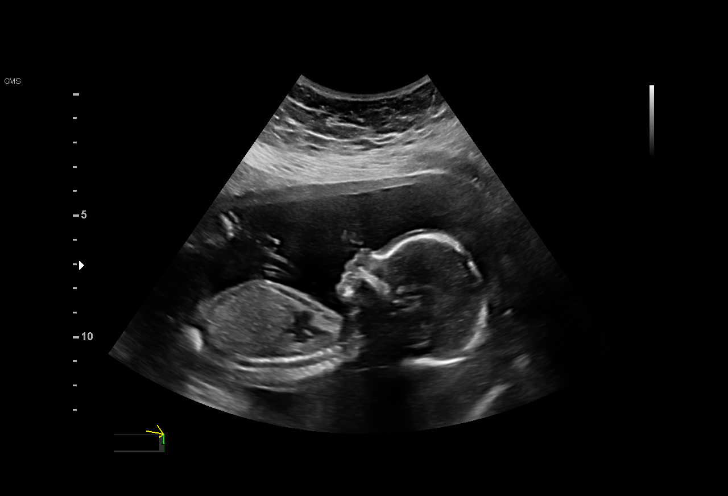
[im 110/135]
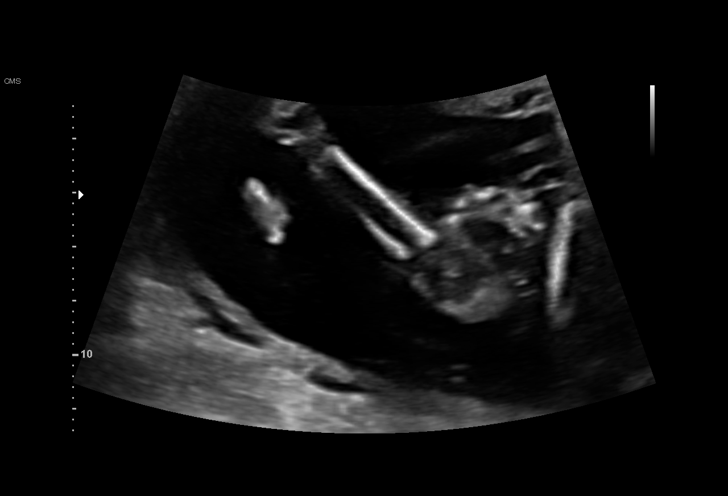
[im 120/135]
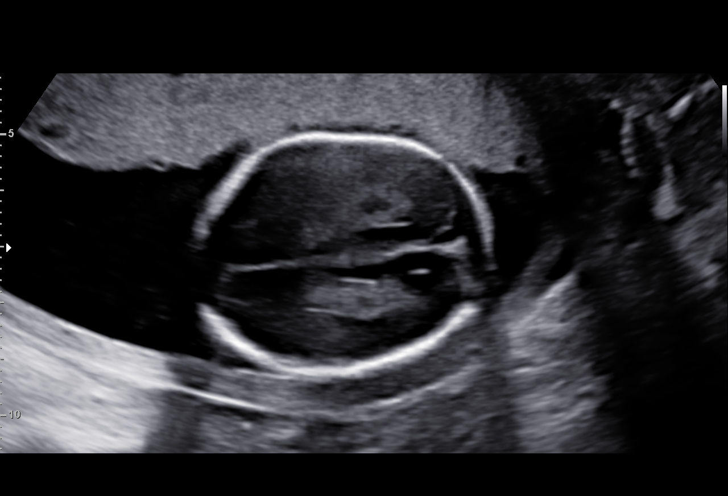
[im 130/135]
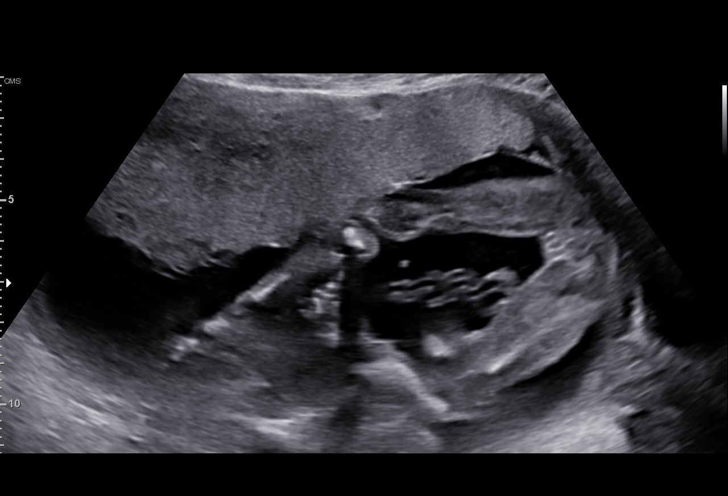

[12 of 28 positions shown; findings below may reference images not displayed]

Indications

 Fetal choroid plexus cyst                      [FG]
 19 weeks gestation of pregnancy
 Antenatal screening for malformations          [FG]
 Low risk NIPS, neg AFP/Horizon 4
Fetal Evaluation

 Num Of Fetuses:         1
 Fetal Heart Rate(bpm):  160
 Cardiac Activity:       Observed
 Presentation:           Breech
 Placenta:               Anterior Right
 P. Cord Insertion:      Visualized

 Amniotic Fluid
 AFI FV:      Within normal limits

                             Largest Pocket(cm)

Biometry

 BPD:        43  mm     G. Age:  19w 0d         27  %    CI:        73.71   %    70 - 86
                                                         FL/HC:      18.5   %    16.8 -
 HC:      159.1  mm     G. Age:  18w 5d         11  %    HC/AC:      1.10        1.09 -
 AC:      144.4  mm     G. Age:  19w 5d         52  %    FL/BPD:     68.4   %
 FL:       29.4  mm     G. Age:  19w 0d         25  %    FL/AC:      20.4   %    20 - 24
 HUM:      30.9  mm     G. Age:  20w 2d         70  %
 CER:      19.7  mm     G. Age:  19w 1d         42  %
 NFT:       2.7  mm
 LV:        5.2  mm
 CM:        4.2  mm

 Est. FW:     288  gm    0 lb 10 oz      33  %
OB History

 Gravidity:    1         Term:   0        Prem:   0        SAB:   0
 TOP:          0       Ectopic:  0        Living: 0
Gestational Age

 LMP:           19w 4d        Date:  [DATE]                 EDD:   [DATE]
 U/S Today:     19w 1d                                        EDD:   [DATE]
 Best:          19w 4d     Det. By:  LMP  ([DATE])          EDD:   [DATE]
Anatomy

 Cranium:               Appears normal         Aortic Arch:            Appears normal
 Cavum:                 Appears normal         Ductal Arch:            Appears normal
 Ventricles:            Appears normal         Diaphragm:              Appears normal
 Choroid Plexus:        Bilateral choroid      Stomach:                Appears normal, left
                        plexus cysts
                                                                       sided
 Cerebellum:            Appears normal         Abdomen:                Appears normal
 Posterior Fossa:       Appears normal         Abdominal Wall:         Appears nml (cord
                                                                       insert, abd wall)
 Nuchal Fold:           Appears normal         Cord Vessels:           Appears normal (3
                                                                       vessel cord)
 Face:                  Appears normal         Kidneys:                Appear normal
                        (orbits and profile)
 Lips:                  Appears normal         Bladder:                Appears normal
 Thoracic:              Appears normal         Spine:                  Appears normal
 Heart:                 Appears normal         Upper Extremities:      Appears normal
                        (4CH, axis, and
                        situs)
 RVOT:                  Appears normal         Lower Extremities:      Appears normal
 LVOT:                  Appears normal

 Other:  Heels/feet and open hands/5th digits visualized. Nasal bone
         visualized. Lenses visualized. VC, 3VV and 3VTV visualized. Fetus
         appears to be female.
Cervix Uterus Adnexa

 Cervix
 Length:           3.41  cm.
 Normal appearance by transabdominal scan.

 Uterus
 No abnormality visualized.

 Right Ovary
 Within normal limits.

 Left Ovary
 Within normal limits.

 Cul De Sac
 No free fluid seen.
 Adnexa
 No abnormality visualized.
Comments

 This patient was seen for a detailed fetal anatomy scan.  She
 reports a history of PCOS and had to take Metformin prior to
 conceiving this pregnancy.
 She denies any other significant past medical history and
 denies any problems in her current pregnancy.
 She had a cell free DNA test earlier in her pregnancy which
 indicated a low risk for trisomy 21, 18, and 13. A female fetus
 is predicted.
 She was informed that the fetal growth and amniotic fluid
 level were appropriate for her gestational age.
 On today's exam, bilateral choroid plexus cysts were noted in
 the fetal brain.  The implications and management of choroid
 plexus cysts were discussed today.  She was advised that the
 choroid plexus cysts are most likely normal variants and will
 usually resolve at around 24 weeks.  The small association of
 bilateral choroid plexus cysts with trisomy 18 was discussed.
 She was advised that as no other anomalies were noted on
 today's ultrasound exam, it is highly unlikely that her fetus
 has trisomy 18.
 Due to the small association between choroid plexus cysts
 and trisomy 18, she was offered and declined an
 amniocentesis today for definitive diagnosis of fetal
 aneuploidy.  She reports that she is comfortable with her
 negative cell free DNA test.
 The patient was informed that anomalies may be missed due
 to technical limitations. If the fetus is in a suboptimal position
 or maternal habitus is increased, visualization of the fetus in
 the maternal uterus may be impaired.
 A follow-up exam was scheduled in 5 weeks for follow-up of
 the bilateral choroid plexus cysts noted today.

## 2020-11-04 ENCOUNTER — Other Ambulatory Visit: Payer: Self-pay | Admitting: *Deleted

## 2020-11-04 DIAGNOSIS — G930 Cerebral cysts: Secondary | ICD-10-CM

## 2020-11-09 ENCOUNTER — Other Ambulatory Visit: Payer: Self-pay

## 2020-11-09 ENCOUNTER — Ambulatory Visit (INDEPENDENT_AMBULATORY_CARE_PROVIDER_SITE_OTHER): Payer: Medicaid Other | Admitting: Obstetrics and Gynecology

## 2020-11-09 VITALS — BP 133/83 | HR 98

## 2020-11-09 DIAGNOSIS — Z3400 Encounter for supervision of normal first pregnancy, unspecified trimester: Secondary | ICD-10-CM

## 2020-11-09 DIAGNOSIS — F418 Other specified anxiety disorders: Secondary | ICD-10-CM

## 2020-11-09 MED ORDER — HYDROXYZINE PAMOATE 25 MG PO CAPS: 25.0000 mg | ORAL_CAPSULE | Freq: Every evening | ORAL | 1 refills | Status: DC | PRN

## 2020-11-09 NOTE — Progress Notes (Signed)
PRENATAL VISIT NOTE  Subjective:  Tonya Hart is a 29 y.o. G1P0000 at [redacted]w[redacted]d being seen today for ongoing prenatal care.  She is currently monitored for the following issues for this low-risk pregnancy and has Cigarette nicotine dependence without complication; Depression with anxiety; Anxiety state; Dysmenorrhea; Positive pregnancy test; and Supervision of normal first pregnancy on their problem list.  Patient reports worsening anxiety and depression.  Contractions: Not present. Vag. Bleeding: None.  Movement: Absent. Denies leaking of fluid.   Reports an increase of worry and anxiety over the last few months seems to be worsening in the second trimester.  She stopped her anxiety and depression medication when she found out she was pregnant and felt she was doing fine initially without it.  She reports her partner is worried about her and wants her to speak to her provider about restarting medication.  She has been going on daily walks with her partner in an effort to help.  She reports insomnia which is also worsening.  She is not taking anything for the insomnia.  She is interested in starting therapy however her insurance does not cover visits and she cannot afford the cost at this time.  She has no thoughts of harm to herself or anybody else. Reports having a hard time staying motivated to leave the house/ do fun activities.    The following portions of the patient's history were reviewed and updated as appropriate: allergies, current medications, past family history, past medical history, past social history, past surgical history and problem list.   Objective:   Vitals:   11/09/20 1455  BP: 133/83  Pulse: 98    Fetal Status:     Movement: Absent     Bedside US done by myself shows active fetus.   General:  Alert, oriented and cooperative. Patient is in no acute distress.  Skin: Skin is warm and dry. No rash noted.   Cardiovascular: Normal heart rate noted  Respiratory: Normal  respiratory effort, no problems with respiration noted  Abdomen: Soft, gravid, appropriate for gestational age.  Pain/Pressure: Absent     Pelvic: Cervical exam deferred        Extremities: Normal range of motion.  Edema: None  Mental Status: Normal mood and affect. Normal behavior. Normal judgment and thought content.   Assessment and Plan:  Pregnancy: G1P0000 at [redacted]w[redacted]d  1. Supervision of normal first pregnancy, antepartum  Bedside US today shows active fetus Doing well Working part time now. Discussed prenatal classes and tour found on [employer].    2. Depression with anxiety  Restart lexapro and buspar.  Start buspar at 5 mg x 1 week then increase to 10 mg Start lexapro 10 mg x 1 week and increase to 20 mg.  Rx: Vistaril for bedtime Start magnesium at bedtime Limited screen time 1 hour prior to bedtime. Discussed a bedtime routine that does not include screens Mood check in 1 week, virtual visit is ok.    Preterm labor symptoms and general obstetric precautions including but not limited to vaginal bleeding, contractions, leaking of fluid and fetal movement were reviewed in detail with the patient. Please refer to After Visit Summary for other counseling recommendations.   Return in about 1 week (around 11/16/2020), or Virtual visit for mood check, med check.  Future Appointments  Date Time Provider Department Center  11/16/2020  2:50 PM Rober Minion [facility]  12/08/2020  3:30 PM [facility] NURSE [facility]  12/08/2020  3:45 PM  [facility] US4 [facility]    Venia Carbon, NP

## 2020-11-16 ENCOUNTER — Telehealth (INDEPENDENT_AMBULATORY_CARE_PROVIDER_SITE_OTHER): Payer: Medicaid Other | Admitting: Certified Nurse Midwife

## 2020-11-16 DIAGNOSIS — F418 Other specified anxiety disorders: Secondary | ICD-10-CM

## 2020-11-16 DIAGNOSIS — Z3402 Encounter for supervision of normal first pregnancy, second trimester: Secondary | ICD-10-CM

## 2020-11-16 DIAGNOSIS — Z3A21 21 weeks gestation of pregnancy: Secondary | ICD-10-CM

## 2020-11-16 NOTE — Progress Notes (Signed)
° °  OBSTETRICS PRENATAL VIRTUAL VISIT ENCOUNTER NOTE  Provider location: Center for [facility] [place]   I connected with Tonya Hart on 11/16/20 at  2:50 PM EST by MyChart Video Encounter at home and verified that I am speaking with the correct person using two identifiers.   I discussed the limitations, risks, security and privacy concerns of performing an evaluation and management service virtually and the availability of in person appointments. I also discussed with the patient that there may be a patient responsible charge related to this service. The patient expressed understanding and agreed to proceed. Subjective:  Tonya Hart is a 29 y.o. G1P0000 at [redacted]w[redacted]d being seen today for ongoing prenatal care.  She is currently monitored for the following issues for this low-risk pregnancy and has Cigarette nicotine dependence without complication; Depression with anxiety; Anxiety state; Dysmenorrhea; Positive pregnancy test; and Supervision of normal first pregnancy on their problem list.  Patient reports no complaints.  Contractions: Not present. Vag. Bleeding: None.  Movement: Present. Denies any leaking of fluid.   The following portions of the patient's history were reviewed and updated as appropriate: allergies, current medications, past family history, past medical history, past social history, past surgical history and problem list.   Objective:  There were no vitals filed for this visit.  Fetal Status:     Movement: Present     General:  Alert, oriented and cooperative. Patient is in no acute distress.  Respiratory: Normal respiratory effort, no problems with respiration noted  Mental Status: Normal mood and affect. Normal behavior. Normal judgment and thought content.  Rest of physical exam deferred due to type of encounter  Imaging:   Assessment and Plan:  Pregnancy: G1P0000 at [redacted]w[redacted]d 1. Encounter for supervision of normal first pregnancy in second  trimester  2. Depression with anxiety - restarted on Lexapor, Buspar, and Vistaril last week - feeling much better and sleeping better  3. [redacted] weeks gestation of pregnancy  Preterm labor symptoms and general obstetric precautions including but not limited to vaginal bleeding, contractions, leaking of fluid and fetal movement were reviewed in detail with the patient. I discussed the assessment and treatment plan with the patient. The patient was provided an opportunity to ask questions and all were answered. The patient agreed with the plan and demonstrated an understanding of the instructions. The patient was advised to call back or seek an in-person office evaluation/go to [facility] at [facility] for any urgent or concerning symptoms. Please refer to After Visit Summary for other counseling recommendations.   I provided 7 minutes of face-to-face time during this encounter.  No follow-ups on file.  Future Appointments  Date Time Provider Department Center  11/16/2020  2:50 PM Rober Minion [facility]  12/08/2020  3:30 PM [facility] NURSE [facility]  12/08/2020  3:45 PM [facility] US4 [facility]    Donette Larry, CNM Center for [employer], [facility]

## 2020-11-16 NOTE — Progress Notes (Signed)
Pt has not taken BP or weight- will take it and send through MyChart

## 2020-11-20 NOTE — L&D Delivery Note (Signed)
Delivery Note Tonya Hart is a 30 y.o. G1P0000 at [redacted]w[redacted]d admitted for IOL for postdates.   GBS Status: negative   Maximum Maternal Temperature: 99.4  Labor course: Initial SVE: 2/70/-2. Augmentation with: Pitocin and IP Foley. She then progressed to complete.  ROM: 2h 14m with clear fluid  Birth: At 0440 a viable female was delivered via spontaneous vaginal delivery (Presentation: cephalic; LOA). Nuchal cord not present. Shoulders and body delivered in usual fashion. Infant placed directly on mom's abdomen for bonding/skin-to-skin, baby dried and stimulated. Cord clamped x 2 after 1 minute and cut by FOB. Cord blood collected. The placenta separated spontaneously and delivered via gentle cord traction.  Pitocin infused rapidly IV per protocol. Fundus firm with massage.  Placenta inspected and appears to be intact with a 3 VC.  Placenta/Cord with the following complications: none.  Cord pH: n/a Sponge and instrument count were correct x2.  Intrapartum complications: gHTN Anesthesia:  epidural Episiotomy: none Lacerations: first degree perineal- repaired, right labial- hemostatic and not repaired Suture Repair: 3.0 vicryl EBL (mL):   Infant: APGAR (1 MIN): 8   APGAR (5 MINS): 9   Infant weight: pending  Mom to postpartum.  Baby to Couplet care / Skin to Skin. Placenta to L&D   Plans to Breastfeed Contraception: oral progesterone-only contraceptive Circumcision: N/A  Note sent to [facility]: KV for pp visit.  Brand Males, CNM 04/01/2021 5:14 AM

## 2020-12-08 ENCOUNTER — Ambulatory Visit: Payer: Medicaid Other | Admitting: *Deleted

## 2020-12-08 ENCOUNTER — Encounter: Payer: Self-pay | Admitting: *Deleted

## 2020-12-08 ENCOUNTER — Other Ambulatory Visit: Payer: Self-pay

## 2020-12-08 ENCOUNTER — Ambulatory Visit: Payer: Medicaid Other | Attending: Obstetrics

## 2020-12-08 VITALS — BP 117/71 | HR 93

## 2020-12-08 DIAGNOSIS — O358XX0 Maternal care for other (suspected) fetal abnormality and damage, not applicable or unspecified: Secondary | ICD-10-CM

## 2020-12-08 DIAGNOSIS — Z362 Encounter for other antenatal screening follow-up: Secondary | ICD-10-CM | POA: Diagnosis not present

## 2020-12-08 DIAGNOSIS — Z3A24 24 weeks gestation of pregnancy: Secondary | ICD-10-CM

## 2020-12-08 DIAGNOSIS — G930 Cerebral cysts: Secondary | ICD-10-CM

## 2020-12-08 IMAGING — US US MFM OB FOLLOW-UP
1 series · 14 of 28 positions shown · non-contrast
Comparison: none

[Series 1: us mfm ob follow-up · 14 of 59 slices shown]
[im 3/59]
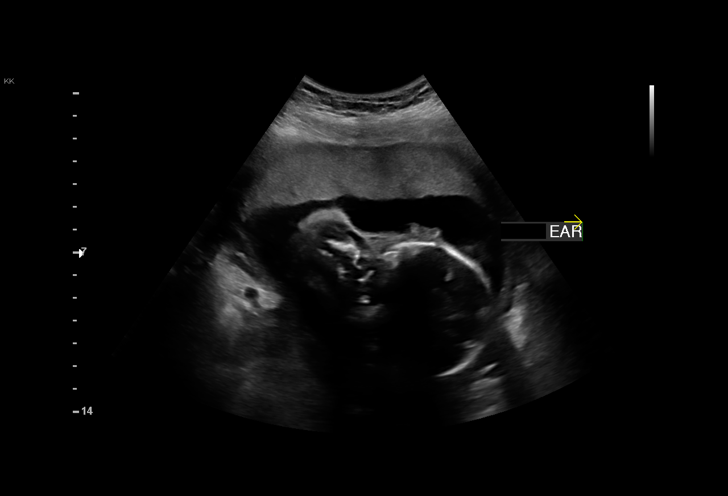
[im 7/59]
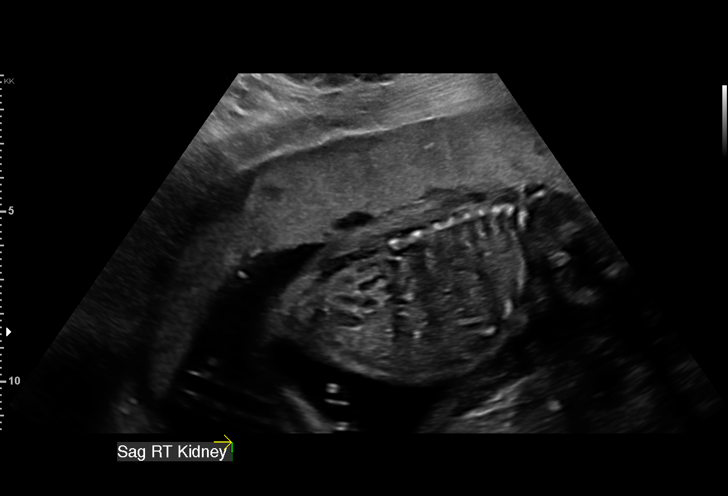
[im 11/59]
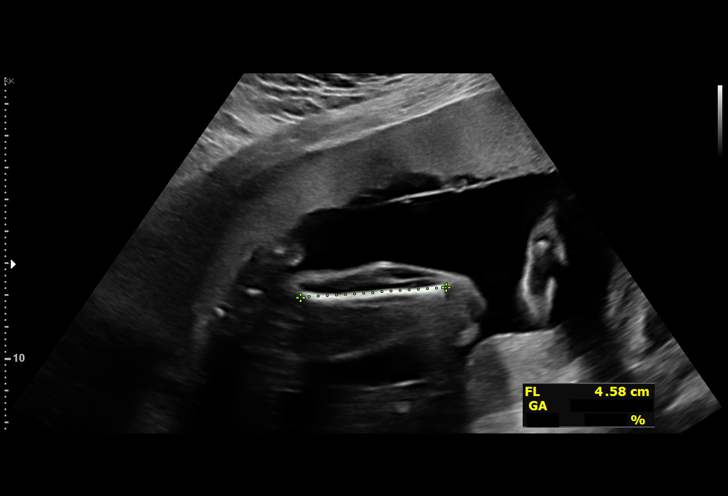
[im 16/59]
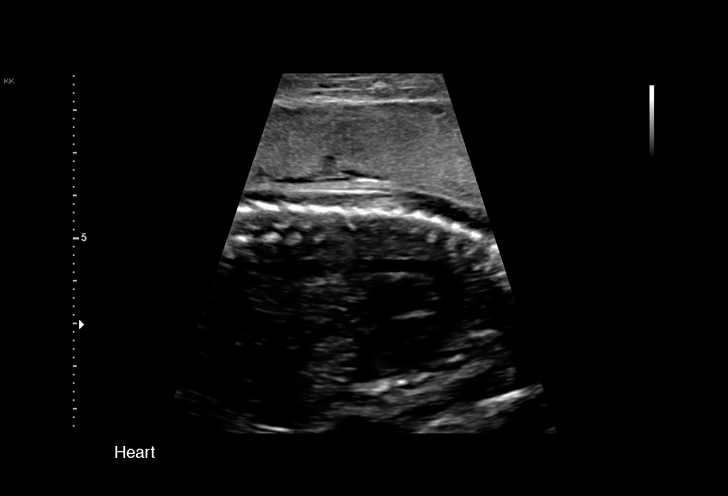
[im 20/59]
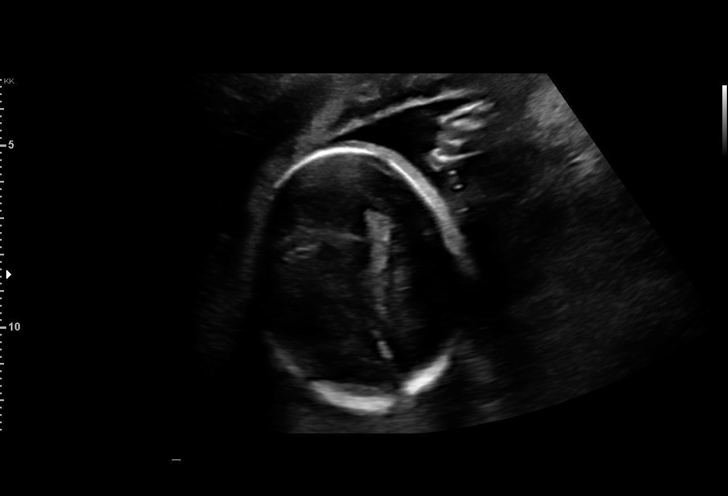
[im 24/59]
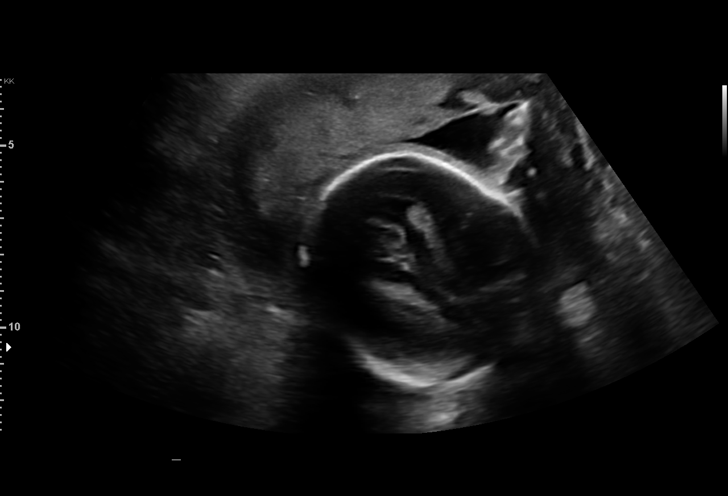
[im 28/59]
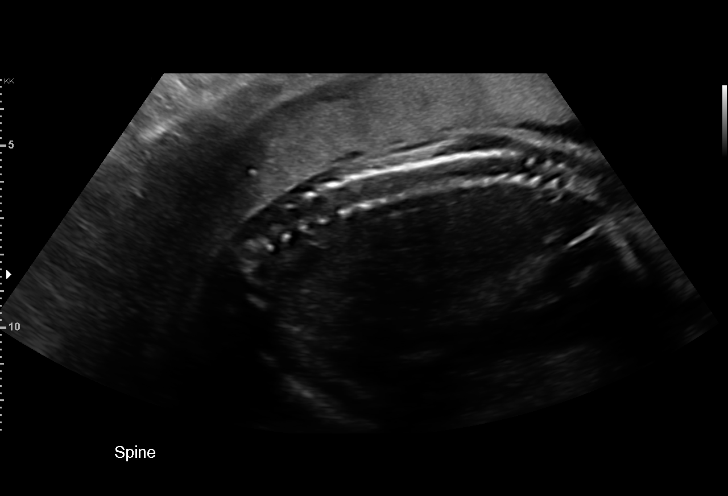
[im 33/59]
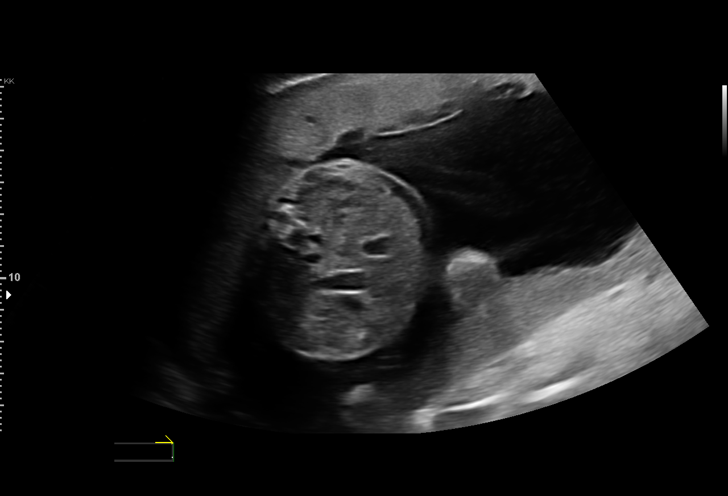
[im 37/59]
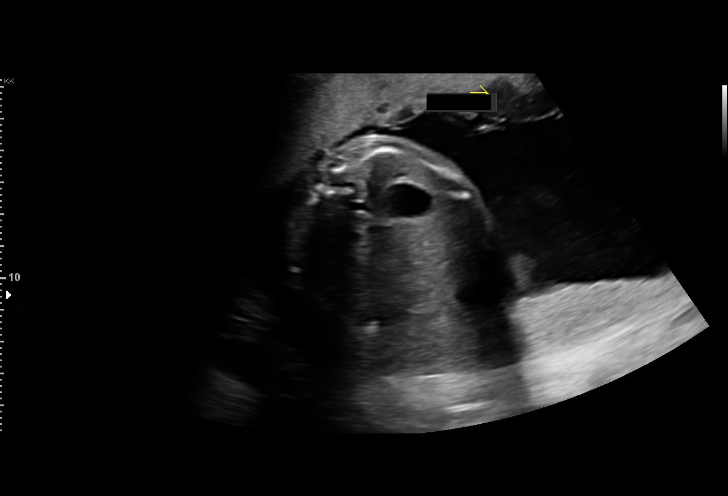
[im 41/59]
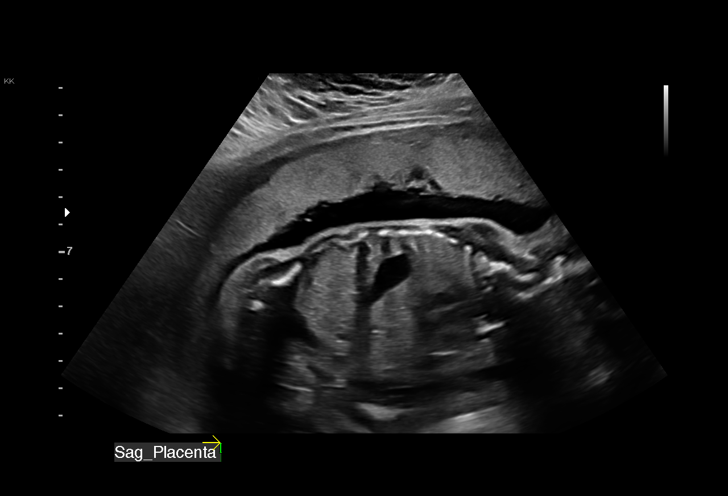
[im 46/59]
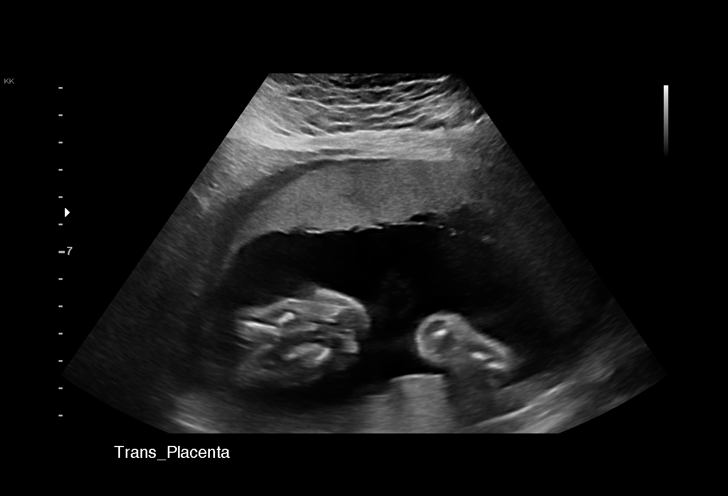
[im 50/59]
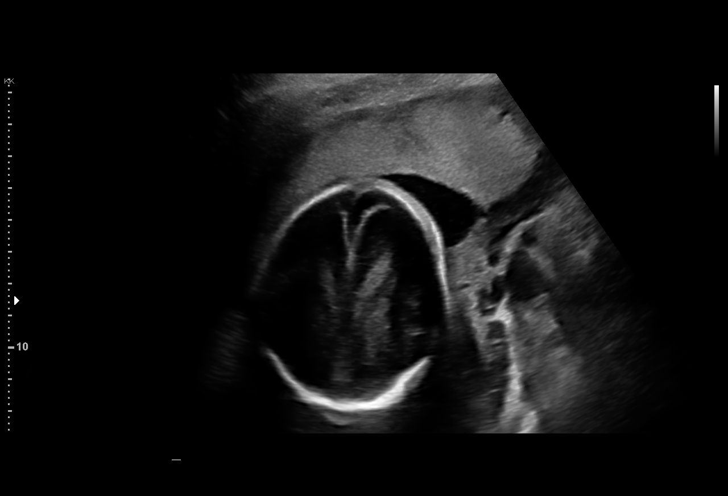
[im 54/59]
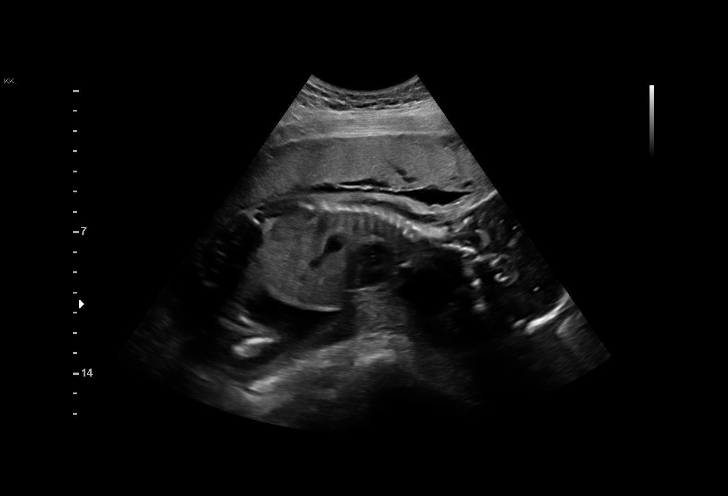
[im 59/59]
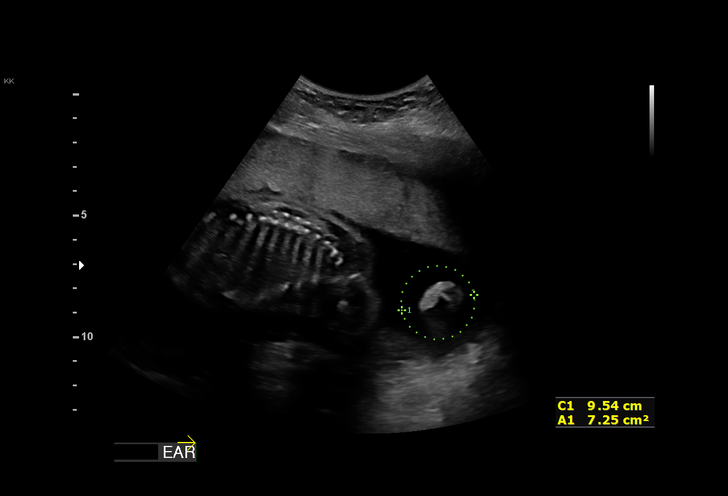

[14 of 28 positions shown; findings below may reference images not displayed]

Indications

 Low risk NIPS, neg AFP/Horizon 4
 Fetal choroid plexus cyst                      [YM]
 Encounter for other antenatal screening        [YM]
 follow-up
 24 weeks gestation of pregnancy
Vital Signs

 BMI:
Fetal Evaluation

 Num Of Fetuses:         1
 Fetal Heart Rate(bpm):  155
 Cardiac Activity:       Observed
 Presentation:           Cephalic
 Placenta:               Anterior Right
 P. Cord Insertion:      Previously Visualized

 Amniotic Fluid
 AFI FV:      Within normal limits

                             Largest Pocket(cm)

Biometry

 BPD:      55.3  mm     G. Age:  22w 6d        3.1  %    CI:        67.21   %    70 - 86
                                                         FL/HC:      20.9   %    18.7 -
 HC:       216   mm     G. Age:  23w 5d          7  %    HC/AC:      1.06        1.04 -
 AC:      203.1  mm     G. Age:  24w 6d         52  %    FL/BPD:     81.6   %    71 - 87
 FL:       45.1  mm     G. Age:  24w 6d         47  %    FL/AC:      22.2   %    20 - 24

 Est. FW:     722  gm      1 lb 9 oz     45  %
OB History

 Gravidity:    1         Term:   0        Prem:   0        SAB:   0
 TOP:          0       Ectopic:  0        Living: 0
Gestational Age

 LMP:           24w 4d        Date:  [DATE]                 EDD:   [DATE]
 U/S Today:     24w 1d                                        EDD:   [DATE]
 Best:          24w 4d     Det. By:  LMP  ([DATE])          EDD:   [DATE]
Anatomy

 Cranium:               Appears normal         Aortic Arch:            Previously seen
 Cavum:                 Previously seen        Ductal Arch:            Previously seen
 Ventricles:            Previously seen        Diaphragm:              Previously seen
 Choroid Plexus:        Appears normal         Stomach:                Appears normal, left
                                                                       sided
 Cerebellum:            Previously seen        Abdomen:                Appears normal
 Posterior Fossa:       Previously seen        Abdominal Wall:         Previously seen
 Nuchal Fold:           Previously seen        Cord Vessels:           Previously seen
 Face:                  Orbits and profile     Kidneys:                Appear normal
                        previously seen
 Lips:                  Previously seen        Bladder:                Appears normal
 Thoracic:              Appears normal         Spine:                  Previously seen
 Heart:                 Previously seen        Upper Extremities:      Previously seen
 RVOT:                  Previously seen        Lower Extremities:      Previously seen
 LVOT:                  Previously seen

 Other:  Heels/feet, open hands/5th digits, Nasal bone, Lenses, and VC, 3VV
         and 3VTV previously visualized. Fetus appears to be female.
Cervix Uterus Adnexa

 Cervix
 Not visualized (advanced GA >[YM])
Impression

 Follow up growth due to choroid plexus cyst
 Normal interval growth with measurements consistent with
 dates
 Good fetal movement and amniotic fluid volume

 Anatomy was completed previously. Choroid cysts are
 resolved as of today's exam.
Recommendations

 Follow up growth as clinically indicated.

## 2020-12-09 ENCOUNTER — Ambulatory Visit (INDEPENDENT_AMBULATORY_CARE_PROVIDER_SITE_OTHER): Payer: Medicaid Other | Admitting: Obstetrics and Gynecology

## 2020-12-09 ENCOUNTER — Encounter: Payer: Self-pay | Admitting: Obstetrics and Gynecology

## 2020-12-09 VITALS — BP 118/74 | HR 91 | Wt 228.0 lb

## 2020-12-09 DIAGNOSIS — M549 Dorsalgia, unspecified: Secondary | ICD-10-CM

## 2020-12-09 DIAGNOSIS — Z7189 Other specified counseling: Secondary | ICD-10-CM

## 2020-12-09 DIAGNOSIS — Z3402 Encounter for supervision of normal first pregnancy, second trimester: Secondary | ICD-10-CM

## 2020-12-09 DIAGNOSIS — O99342 Other mental disorders complicating pregnancy, second trimester: Secondary | ICD-10-CM

## 2020-12-09 DIAGNOSIS — Z3A24 24 weeks gestation of pregnancy: Secondary | ICD-10-CM

## 2020-12-09 DIAGNOSIS — O99891 Other specified diseases and conditions complicating pregnancy: Secondary | ICD-10-CM

## 2020-12-09 DIAGNOSIS — F418 Other specified anxiety disorders: Secondary | ICD-10-CM

## 2020-12-09 DIAGNOSIS — O26892 Other specified pregnancy related conditions, second trimester: Secondary | ICD-10-CM

## 2020-12-09 MED ORDER — CYCLOBENZAPRINE HCL 10 MG PO TABS: 10.0000 mg | ORAL_TABLET | Freq: Three times a day (TID) | ORAL | 1 refills | Status: DC | PRN

## 2020-12-09 MED ORDER — BUSPIRONE HCL 10 MG PO TABS: 10.0000 mg | ORAL_TABLET | Freq: Three times a day (TID) | ORAL | 2 refills | Status: DC

## 2020-12-09 NOTE — Progress Notes (Signed)
   PRENATAL VISIT NOTE  Subjective:  Tonya Hart is a 30 y.o. G1P0000 at [redacted]w[redacted]d being seen today for ongoing prenatal care.  She is currently monitored for the following issues for this low-risk pregnancy and has Cigarette nicotine dependence without complication; Depression with anxiety; Anxiety state; Dysmenorrhea; Positive pregnancy test; and Supervision of normal first pregnancy on their problem list.  Patient reports backache.  Contractions: Not present. Vag. Bleeding: None.  Movement: Present. Denies leaking of fluid.   The following portions of the patient's history were reviewed and updated as appropriate: allergies, current medications, past family history, past medical history, past social history, past surgical history and problem list.   Objective:   Vitals:   12/09/20 1354  BP: 118/74  Pulse: 91  Weight: 228 lb (103.4 kg)    Fetal Status: Fetal Heart Rate (bpm): 150   Movement: Present     General:  Alert, oriented and cooperative. Patient is in no acute distress.  Skin: Skin is warm and dry. No rash noted.   Cardiovascular: Normal heart rate noted  Respiratory: Normal respiratory effort, no problems with respiration noted  Abdomen: Soft, gravid, appropriate for gestational age.  Pain/Pressure: Present     Pelvic: Cervical exam deferred        Extremities: Normal range of motion.  Edema: None  Mental Status: Normal mood and affect. Normal behavior. Normal judgment and thought content.   Assessment and Plan:  Pregnancy: G1P0000 at [redacted]w[redacted]d  1. Encounter for supervision of normal first pregnancy in second trimester  2. Depression with anxiety Doing well, refill sent on buspar  3. [redacted] weeks gestation of pregnancy  4. Back pain affecting pregnancy in second trimester Flexeril sent - Ambulatory referral to Physical Therapy  5. Counseled about COVID-19 virus infection COVID-19 Vaccine Counseling: The patient was counseled on the potential benefits and lack of known  risks of COVID vaccination, during pregnancy and breastfeeding, during today's visit. The patient's questions and concerns were addressed today, including safety of the vaccination and potential side effects as they have been published by ACOG and [facility]. The patient has been informed that there have not been any documented vaccine related injuries, deaths or birth defects to infant or mom after receiving the COVID-19 vaccine to date. The patient has been made aware that although she is not at increased risk of contracting COVID-19 during pregnancy, she is at increased risk of developing severe disease and complications if she contracts COVID-19 while pregnant. All patient questions were addressed during our visit today. The patient is still unsure of her decision for vaccination.    Preterm labor symptoms and general obstetric precautions including but not limited to vaginal bleeding, contractions, leaking of fluid and fetal movement were reviewed in detail with the patient. Please refer to After Visit Summary for other counseling recommendations.   Return in about 4 weeks (around 01/06/2021) for low OB, in person, 2 hr GTT, 3rd trim labs.  No future appointments.  Conan Bowens, MD

## 2020-12-09 NOTE — Progress Notes (Signed)
Pt c/o lower back pain- denies dysuria

## 2020-12-21 ENCOUNTER — Encounter: Payer: Self-pay | Admitting: Physical Therapy

## 2020-12-21 ENCOUNTER — Ambulatory Visit: Payer: Medicaid Other | Attending: Obstetrics and Gynecology | Admitting: Physical Therapy

## 2020-12-21 ENCOUNTER — Other Ambulatory Visit: Payer: Self-pay

## 2020-12-21 DIAGNOSIS — M5442 Lumbago with sciatica, left side: Secondary | ICD-10-CM | POA: Insufficient documentation

## 2020-12-21 DIAGNOSIS — M533 Sacrococcygeal disorders, not elsewhere classified: Secondary | ICD-10-CM | POA: Diagnosis not present

## 2020-12-21 DIAGNOSIS — R262 Difficulty in walking, not elsewhere classified: Secondary | ICD-10-CM | POA: Diagnosis not present

## 2020-12-21 DIAGNOSIS — M6281 Muscle weakness (generalized): Secondary | ICD-10-CM | POA: Insufficient documentation

## 2020-12-21 NOTE — Therapy (Signed)
[facility] [address]  [address] [place], [place], [address] Phone: [phone]   Fax:  [phone]  Physical Therapy Evaluation  Patient Details  Name: Tonya Hart MRN: [id number] Date of Birth: 04/16/1991 Referring Provider (PT): Leroy Libman, MD   Encounter Date: 12/21/2020   PT End of Session - 12/21/20 0932    Visit Number 1    Number of Visits 16    Date for PT Re-Evaluation 02/15/21    Authorization Type [employer]    PT Start Time 0932    PT Stop Time 1018    PT Time Calculation (min) 46 min    Activity Tolerance Patient limited by pain    Behavior During Therapy [facility] for tasks assessed/performed           Past Medical History:  Diagnosis Date  . Anxiety and depression    no current medications  . Asthma   . Endometriosis   . Environmental and seasonal allergies   . PCOS (polycystic ovarian syndrome)   . Seasonal allergies     Past Surgical History:  Procedure Laterality Date  . TONSILLECTOMY    . TONSILLECTOMY AND ADENOIDECTOMY      There were no vitals filed for this visit.    Subjective Assessment - 12/21/20 0935    Subjective Pt reports her back is "really messed up from years of motocross and gymnastics". Years ago was diagnosed with herniated discs - did traction and some PT at the time but did not get to follow through at the time due to going off to school. Pain exacerbated over past 1.5-2 months due to pregnancy (currently 5 mos). Pain currently worst in the morning with L sciatica/radiculopathy. Pain severe enough at times that she has to call out of work.    Pertinent History 5 mos pregnant    Limitations Sitting;Standing;Walking;House hold activities;Lifting    How long can you sit comfortably? 1 hr    How long can you stand comfortably? 30-40 minutes    How long can you walk comfortably? 30-40 minutes    Patient Stated Goals "to be able to stand and walk"    Currently in  Pain? Yes    Pain Score 7    6-7/10   Pain Location Back    Pain Orientation Lower    Pain Descriptors / Indicators Stabbing;Dull;Constant   "feels like a pinched nerve"   Pain Type Acute pain    Pain Radiating Towards up btw shoulder blades, numbness/tingling with shooting pain up to 10/10 down L LE    Pain Onset More than a month ago   1.5-2 months ago   Pain Frequency Constant    Aggravating Factors  lifting, bending over    Pain Relieving Factors heating pad, hot shower, IcyHot    Effect of Pain on Daily Activities difficulty squating, limited standing tolerance due to L LE going numb              [facility] PT Assessment - 12/21/20 0932      Assessment   Medical Diagnosis Back pain affecting pregnancy in 2nd trimester    Referring Provider (PT) Leroy Libman, MD    Onset Date/Surgical Date --   1.5-2 months ago   Hand Dominance Right    Next MD Visit 01/04/21    Prior Therapy remote h/o PT for LBP ~2012      Precautions   Precautions Other (comment)    Precaution Comments 5  mos pregnant      Restrictions   Weight Bearing Restrictions No      Balance Screen   Has the patient fallen in the past 6 months Yes    How many times? 1 - slipped on ice on stairs but caught herself on railings    Has the patient had a decrease in activity level because of a fear of falling?  Yes    Is the patient reluctant to leave their home because of a fear of falling?  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, lifting, pushing bakery cart    Leisure walk the  cats in 1 acre yard, housework      Cognition   Overall Cognitive Status Within Functional Limits for tasks assessed      ROM / Strength   AROM / PROM / Strength AROM;Strength      AROM   AROM Assessment Site Lumbar    Lumbar Flexion hands to knees - tightness/pain    Lumbar Extension 75% limited - pinching & shooting on L LE    Lumbar - Right Side Bend hand to just above knee - pulling on L    Lumbar - Left Side Bend hand to mid thigh - pinching & shooting on L LE    Lumbar - Right Rotation 40% limited - pulling on L    Lumbar - Left Rotation 75% limited - pinching & shooting on L LE      Strength   Overall Strength Comments tested in sitting    Strength Assessment Site Hip;Knee;Ankle    Right/Left Hip Right;Left    Right Hip Flexion 4/5    Right Hip Extension 4-/5    Right Hip External Rotation  4-/5    Right Hip Internal Rotation 4-/5    Right Hip ABduction 4/5    Right Hip ADduction 4-/5    Left Hip Flexion 3-/5    Left Hip Extension 3-/5    Left Hip External Rotation 3/5    Left Hip Internal Rotation 3/5    Left Hip ABduction 3+/5    Left Hip ADduction 3-/5    Right/Left Knee Right;Left    Right Knee Flexion 4/5    Right Knee Extension 4/5    Left Knee Flexion 3+/5    Left Knee Extension 3-/5    Right/Left Ankle Right;Left    Right Ankle Dorsiflexion 4/5    Left Ankle Dorsiflexion 3+/5      Palpation   SI assessment  apparent L anterior innominate rotation with L LE shortening    Palpation comment ttp over L SIJ and t/o L>R thoracolumbar paraspinals, QL, glutes and piriformis                      Objective measurements completed on examination: See above findings.       [facility] Adult PT Treatment/Exercise - 12/21/20 0932      Exercises  Exercises Lumbar      Lumbar Exercises: Stretches   Quadruped Mid Back Stretch 3 reps;30 seconds    Quadruped Mid Back Stretch Limitations 3-way seated prayer stretch with cane for UE support      Lumbar  Exercises: Seated   Other Seated Lumbar Exercises L sciatic nerve glide x 10                  PT Education - 12/21/20 1018    Education Details PT eval findings, anticipated POC & initial HEP    Person(s) Educated Patient    Methods Explanation;Demonstration;Verbal cues;Handout    Comprehension Verbalized understanding;Verbal cues required;Returned demonstration;Need further instruction            PT Short Term Goals - 12/21/20 1018      PT SHORT TERM GOAL #1   Title Patient will be independent with initial HEP    Status New    Target Date 01/11/21      PT SHORT TERM GOAL #2   Title Patient will verbalize/demonstrate understanding of neutral spine posture and proper body mechanics to reduce strain on lumbar spine    Status New    Target Date 01/18/21      PT SHORT TERM GOAL #3   Title Patient will notice centralization of pain in L LE with exercises and ADLs    Status New    Target Date 01/18/21             PT Long Term Goals - 12/21/20 1018      PT LONG TERM GOAL #1   Title Patient will be independent with ongoing/advanced HEP for self-management at home    Status New    Target Date 02/15/21      PT LONG TERM GOAL #2   Title Decrease LBP & L LE radicular pain by >/= 50-75% allowing patient increased ease of transitional mobilty and standing tolerance    Status New    Target Date 02/15/21      PT LONG TERM GOAL #3   Title Patient to improve lumbar AROM to [facility] of pregnancy without pain provocation    Status New    Target Date 02/15/21      PT LONG TERM GOAL #4   Title Patient will demonstrate improved B LE strength to >/= 4/5 for improved stability and ease of mobility    Status New    Target Date 02/15/21      PT LONG TERM GOAL #5   Title Patient to demonstrate appropriate posture and body mechanics needed for daily activities    Status New    Target Date 02/15/21      PT LONG TERM GOAL #6   Title Patient to report ability to perform ADLs,  household, and work-related tasks without limitation due to LBP, LOM or weakness    Status New    Target Date 02/15/21                  Plan - 12/21/20 1018    Clinical Impression Statement Ladaisha is a 77-month pregnant 30 y/o female who presents to OP PT with 1.5-2 month h/o worsening LBP with L sciatica. Pain predominantly in low back and L LE and is exacerbated by any movement, esp transitional movements, standing and walking. TTP present throughout B lumbar paraspinals, PSIS, glutes, groin and public symphysis, with pain slightly worse on R than L. Leg length discrepancy evident with apparent L anterior innominate rotation of pelvis on  sacrum - correctable with gentle L LE distraction mobs. Significant L LE and core weakness present adding to instability due to ligamentous laxity secondary to pregnancy.  Brianne will benefit from skilled PT to address the above listed deficits to maximize function and mobility tolerance with decreased pain interference.    Personal Factors and Comorbidities Time since onset of injury/illness/exacerbation;Past/Current Experience;Comorbidity 3+    Comorbidities 5 mo pregnant, h/o lumbar HNP, anxiety and depression, PCOS, asthma, allergies    Examination-Activity Limitations Bathing;Bed Mobility;Bend;Caring for Others;Carry;Dressing;Hygiene/Grooming;Lift;Locomotion Level;Reach Overhead;Sit;Sleep;Squat;Stairs;Stand;Toileting;Transfers    Examination-Participation Restrictions Cleaning;Community Activity;Driving;Interpersonal Relationship;Laundry;Meal Prep;Occupation;Shop    Stability/Clinical Decision Making Evolving/Moderate complexity    Clinical Decision Making Moderate    Rehab Potential Good    PT Frequency 2x / week    PT Duration 8 weeks    PT Treatment/Interventions ADLs/Self Care Home Management;Cryotherapy;Moist Heat;Functional mobility training;Therapeutic activities;Therapeutic exercise;Neuromuscular re-education;Patient/family education;Manual  techniques;Passive range of motion;Energy conservation;Taping    PT Next Visit Plan Review initial HEP; introduce gentle lumbopelvic mobilty and strengthening    PT Home Exercise Plan 2/1 - seated sciatic nerve glide, seated 3-way prayer stretch    Consulted and Agree with Plan of Care Patient           Patient will benefit from skilled therapeutic intervention in order to improve the following deficits and impairments:  Abnormal gait,Decreased activity tolerance,Decreased balance,Decreased endurance,Decreased knowledge of precautions,Decreased mobility,Decreased range of motion,Decreased strength,Difficulty walking,Increased fascial restricitons,Increased muscle spasms,Impaired perceived functional ability,Impaired flexibility,Impaired sensation,Improper body mechanics,Postural dysfunction,Pain  Visit Diagnosis: Acute bilateral low back pain with left-sided sciatica  Muscle weakness (generalized)  Difficulty in walking, not elsewhere classified  Sacrococcygeal disorders, not elsewhere classified     Problem List Patient Active Problem List   Diagnosis Date Noted  . Supervision of normal first pregnancy 09/01/2020  . Positive pregnancy test 08/04/2020  . Dysmenorrhea 05/18/2020  . Depression with anxiety 04/04/2020  . Anxiety state 04/04/2020  . Cigarette nicotine dependence without complication 01/04/2018    Marry Guan, PT, MPT 12/21/2020, 2:32 PM  [facility] [address]  [address] [place], [place], [address] Phone: [phone]   Fax:  [phone]  Name: Tonya Hart MRN: [id number] Date of Birth: [birth year]

## 2020-12-21 NOTE — Patient Instructions (Signed)
    Home exercise program created by Jago Carton, PT.  For questions, please contact Fahad Cisse via phone at [phone] or email at [email or web address]  [facility] [place] [address]  [address] [place], [place], [address] Phone: [phone]   Fax:  [phone]    

## 2020-12-29 ENCOUNTER — Encounter: Payer: Self-pay | Admitting: Physical Therapy

## 2020-12-29 ENCOUNTER — Other Ambulatory Visit: Payer: Self-pay

## 2020-12-29 ENCOUNTER — Ambulatory Visit: Payer: Medicaid Other | Admitting: Physical Therapy

## 2020-12-29 DIAGNOSIS — M533 Sacrococcygeal disorders, not elsewhere classified: Secondary | ICD-10-CM | POA: Diagnosis not present

## 2020-12-29 DIAGNOSIS — M5442 Lumbago with sciatica, left side: Secondary | ICD-10-CM | POA: Diagnosis not present

## 2020-12-29 DIAGNOSIS — M6281 Muscle weakness (generalized): Secondary | ICD-10-CM

## 2020-12-29 DIAGNOSIS — R262 Difficulty in walking, not elsewhere classified: Secondary | ICD-10-CM

## 2020-12-29 NOTE — Patient Instructions (Addendum)
Access Code: [email or web address] URL: [email or web address] Date: 12/29/2020 Prepared by: Glenetta Hew  Exercises Seated Thoracic Lumbar Extension - 1 x daily - 7 x weekly - 2 sets - 10 reps - 5 sec hold Seated Table Piriformis Stretch - 1 x daily - 7 x weekly - 3 reps - 30 sec hold Cat-Camel - 1 x daily - 7 x weekly - 2 sets - 10 reps - 5 sec hold Seated Abdominal Press into [employer] - 1 x daily - 7 x weekly - 2 sets - 10 reps - 5 sec hold    Sleeping on Back  Place pillow under knees. A pillow with cervical support and a roll around waist are also helpful. Copyright  VHI. All rights reserved.  Sleeping on Side Place pillow between knees. Use cervical support under neck and a roll around waist as needed. Copyright  VHI. All rights reserved.   Sleeping on Stomach   If this is the only desirable sleeping position, place pillow under lower legs, and under stomach or chest as needed.  Posture - Sitting   Sit upright, head facing forward. Try using a roll to support lower back. Keep shoulders relaxed, and avoid rounded back. Keep hips level with knees. Avoid crossing legs for long periods. Stand to Sit / Sit to Stand   To sit: Bend knees to lower self onto front edge of chair, then scoot back on seat. To stand: Reverse sequence by placing one foot forward, and scoot to front of seat. Use rocking motion to stand up.   Work Height and Reach  Ideal work height is no more than 2 to 4 inches below elbow level when standing, and at elbow level when sitting. Reaching should be limited to arm's length, with elbows slightly bent.  Bending  Bend at hips and knees, not back. Keep feet shoulder-width apart.    Posture - Standing   Good posture is important. Avoid slouching and forward head thrust. Maintain curve in low back and align ears over shoul- ders, hips over ankles.  Alternating Positions   Alternate tasks and change positions frequently to reduce fatigue and  muscle tension. Take rest breaks. Computer Work   Position work to Art gallery manager. Use proper work and seat height. Keep shoulders back and down, wrists straight, and elbows at right angles. Use chair that provides full back support. Add footrest and lumbar roll as needed.  Getting Into / Out of Car  Lower self onto seat, scoot back, then bring in one leg at a time. Reverse sequence to get out.  Dressing  Lie on back to pull socks or slacks over feet, or sit and bend leg while keeping back straight.    Housework - Sink  Place one foot on ledge of cabinet under sink when standing at sink for prolonged periods.   Pushing / Pulling  Pushing is preferable to pulling. Keep back in proper alignment, and use leg muscles to do the work.  Deep Squat   Squat and lift with both arms held against upper trunk. Tighten stomach muscles without holding breath. Use smooth movements to avoid jerking.  Avoid Twisting   Avoid twisting or bending back. Pivot around using foot movements, and bend at knees if needed when reaching for articles.  Carrying Luggage   Distribute weight evenly on both sides. Use a cart whenever possible. Do not twist trunk. Move body as a unit.   Lifting Principles .Maintain proper posture and head alignment. .Slide object as  close as possible before lifting. .Move obstacles out of the way. .Test before lifting; ask for help if too heavy. .Tighten stomach muscles without holding breath. .Use smooth movements; do not jerk. .Use legs to do the work, and pivot with feet. .Distribute the work load symmetrically and close to the center of trunk. .Push instead of pull whenever possible.   Ask For Help   Ask for help and delegate to others when possible. Coordinate your movements when lifting together, and maintain the low back curve.  Log Roll   Lying on back, bend left knee and place left arm across chest. Roll all in one movement to the right. Reverse to roll  to the left. Always move as one unit. Housework - Sweeping  Use long-handled equipment to avoid stooping.   Housework - Wiping  Position yourself as close as possible to reach work surface. Avoid straining your back.  Laundry - Unloading Wash   To unload small items at bottom of washer, lift leg opposite to arm being used to reach.  Gardening - Raking  Move close to area to be raked. Use arm movements to do the work. Keep back straight and avoid twisting.     Cart  When reaching into cart with one arm, lift opposite leg to keep back straight.   Getting Into / Out of Bed  Lower self to lie down on one side by raising legs and lowering head at the same time. Use arms to assist moving without twisting. Bend both knees to roll onto back if desired. To sit up, start from lying on side, and use same move-ments in reverse. Housework - Vacuuming  Hold the vacuum with arm held at side. Step back and forth to move it, keeping head up. Avoid twisting.   Laundry - Armed forces training and education officer so that bending and twisting can be avoided.   Laundry - Unloading Dryer  Squat down to reach into clothes dryer or use a reacher.  Gardening - Weeding / Psychiatric nurse or Kneel. Knee pads may be helpful.

## 2020-12-29 NOTE — Therapy (Addendum)
[facility] [address]  [place], [place], [address] Phone: [phone]   Fax:  [phone]  Physical Therapy Treatment / Discharge Summary  Patient Details  Name: Tonya Hart MRN: [id number] Date of Birth: [birth year] Referring Provider (PT): Vivien Rota, MD   Encounter Date: 12/29/2020   PT End of Session - 12/29/20 1009    Visit Number 2    Number of Visits 16    Date for PT Re-Evaluation 02/15/21    Authorization Type [employer]    PT Start Time 1009    PT Stop Time 1057    PT Time Calculation (min) 48 min    Activity Tolerance Patient tolerated treatment well;Patient limited by pain    Behavior During Therapy [facility] for tasks assessed/performed           Past Medical History:  Diagnosis Date  . Anxiety and depression    no current medications  . Asthma   . Endometriosis   . Environmental and seasonal allergies   . PCOS (polycystic ovarian syndrome)   . Seasonal allergies     Past Surgical History:  Procedure Laterality Date  . TONSILLECTOMY    . TONSILLECTOMY AND ADENOIDECTOMY      There were no vitals filed for this visit.   Subjective Assessment - 12/29/20 1012    Subjective Pt reports HEP stretches seem to be helping with pain becoming less intense. Pain still aggravated by her attempts to clean and ready the house for the baby's arrival.    Pertinent History 5 mos pregnant    Patient Stated Goals "to be able to stand and walk"    Currently in Pain? Yes    Pain Score 5     Pain Location Back    Pain Orientation Lower    Pain Descriptors / Indicators Stabbing    Pain Type Acute pain    Pain Radiating Towards occasional shooting pain down L LE    Pain Frequency Constant                             [facility] Adult PT Treatment/Exercise - 12/29/20 1009      Self-Care   Self-Care Posture    Posture Provided education in neutral spine posture and proper body  mechanics during typical daily ADLs and household chores to reduce lumbar strain - pt noting areas for improvement.      Exercises   Exercises Lumbar      Lumbar Exercises: Stretches   Quadruped Mid Back Stretch 3 reps;30 seconds    Quadruped Mid Back Stretch Limitations 3-way seated prayer stretch with cane for UE support    Figure 4 Stretch 2 reps;30 seconds;Seated;Without overpressure    Figure 4 Stretch Limitations side-sitting over edge of mat table      Lumbar Exercises: Aerobic   Nustep L2 x 6 min (UE/LE)      Lumbar Exercises: Seated   Hip Flexion on Ball Both;10 reps    Hip Flexion on Ball Limitations brace marching at edge of mat table    Other Seated Lumbar Exercises L/R sciatic nerve glide x 10 - attempted to include full trunk and LE movement pattern but limited d/t mid back pain therefore reverted to LE component only & introduced thoracic extension mobs separately    Other Seated Lumbar Exercises Thoracic extension mobs over back of chair with hands clasped behind head 10 x 5"  Lumbar Exercises: Supine   Large Ball Abdominal Isometric 10 reps;5 seconds    Large Ball Abdominal Isometric Limitations seated on edge of mat table      Lumbar Exercises: Quadruped   Madcat/Old Horse 10 reps    Single Arm Raise Right;Left;5 reps;3 seconds    Single Arm Raises Limitations discontinued d/t increasing mid back pain    Straight Leg Raise 5 reps;3 seconds    Straight Leg Raises Limitations discontinued d/t increasing mid back pain      Manual Therapy   Manual Therapy Other (comment)    Other Manual Therapy Provided instruction in self-STM to glutes/piriformis using small ball on wall x 1-2 minutes per tender area                  PT Education - 12/29/20 1055    Education Details HEP update - Access Code: [email or web address]    Person(s) Educated Patient    Methods Explanation;Demonstration;Verbal cues;Handout    Comprehension Verbalized understanding;Verbal cues  required;Returned demonstration;Need further instruction            PT Short Term Goals - 12/29/20 1016      PT SHORT TERM GOAL #1   Title Patient will be independent with initial HEP    Status Achieved   12/29/20   Target Date --      PT SHORT TERM GOAL #2   Title Patient will verbalize/demonstrate understanding of neutral spine posture and proper body mechanics to reduce strain on lumbar spine    Status On-going   12/29/20 - education provided today   Target Date 01/18/21      PT SHORT TERM GOAL #3   Title Patient will notice centralization of pain in L LE with exercises and ADLs    Status On-going    Target Date 01/18/21             PT Long Term Goals - 12/29/20 1016      PT LONG TERM GOAL #1   Title Patient will be independent with ongoing/advanced HEP for self-management at home    Status On-going    Target Date 02/15/21      PT LONG TERM GOAL #2   Title Decrease LBP & L LE radicular pain by >/= 50-75% allowing patient increased ease of transitional mobilty and standing tolerance    Status On-going    Target Date 02/15/21      PT LONG TERM GOAL #3   Title Patient to improve lumbar AROM to [facility] of pregnancy without pain provocation    Status On-going    Target Date 02/15/21      PT LONG TERM GOAL #4   Title Patient will demonstrate improved B LE strength to >/= 4/5 for improved stability and ease of mobility    Status On-going    Target Date 02/15/21      PT LONG TERM GOAL #5   Title Patient to demonstrate appropriate posture and body mechanics needed for daily activities    Status On-going    Target Date 02/15/21      PT LONG TERM GOAL #6   Title Patient to report ability to perform ADLs, household, and work-related tasks without limitation due to LBP, LOM or weakness    Status On-going    Target Date 02/15/21                 Plan - 12/29/20 1017    Clinical Impression Statement Tiesha reports HEP stretches seem to be  helping to reduce LBP and  sciatic nerve glides are helping with her radicular pain. HEP reviewed with pt able to perform good return demonstration (STG #1 met) but limited tolerance for attempted progression of sciatic nerve glide to include full trunk and LE movement pattern d/t mid back pain, therefore reverted to LE component only and introduced thoracic extension mobs separately with good tolerance. Introduced gentle lumbopelvic flexibility/mobility with good tolerance for cat/camel but mid back pain limiting quadruped arm or leg raises. Better tolerance for abdominal bracing exercises in sitting but increased glute/piriformis tension noted with brace marching. Able to provide good relief with seated piriformis stretches and instruction in self-STM with ball on wall. Pt noting some mild muscle soreness by end of session but declining offer of heat pack as she states she has a heated massager in her car seat.    Comorbidities 5 mo pregnant, h/o lumbar HNP, anxiety and depression, PCOS, asthma, allergies    Rehab Potential Good    PT Frequency 2x / week    PT Duration 8 weeks    PT Treatment/Interventions ADLs/Self Care Home Management;Cryotherapy;Moist Heat;Functional mobility training;Therapeutic activities;Therapeutic exercise;Neuromuscular re-education;Patient/family education;Manual techniques;Passive range of motion;Energy conservation;Taping    PT Next Visit Plan progress gentle lumbopelvic mobilty and strengthening; review & update HEP as indicated    PT Home Exercise Plan 2/1 - seated sciatic nerve glide, seated 3-way prayer stretch; 2/9 - Access Code: [email or web address]    Consulted and Agree with Plan of Care Patient           Patient will benefit from skilled therapeutic intervention in order to improve the following deficits and impairments:  Abnormal gait,Decreased activity tolerance,Decreased balance,Decreased endurance,Decreased knowledge of precautions,Decreased mobility,Decreased range of motion,Decreased  strength,Difficulty walking,Increased fascial restricitons,Increased muscle spasms,Impaired perceived functional ability,Impaired flexibility,Impaired sensation,Improper body mechanics,Postural dysfunction,Pain  Visit Diagnosis: Acute bilateral low back pain with left-sided sciatica  Muscle weakness (generalized)  Difficulty in walking, not elsewhere classified  Sacrococcygeal disorders, not elsewhere classified     Problem List Patient Active Problem List   Diagnosis Date Noted  . Supervision of normal first pregnancy 09/01/2020  . Positive pregnancy test 08/04/2020  . Dysmenorrhea 05/18/2020  . Depression with anxiety 04/04/2020  . Anxiety state 04/04/2020  . Cigarette nicotine dependence without complication 78/46/9629    Percival Spanish, PT, MPT 12/29/2020, 11:27 AM  [facility] [address]  [place], [place], [address] Phone: [phone]   Fax:  [phone]  Name: Britany Callicott MRN: [id number] Date of Birth: December 29, 1990  PHYSICAL THERAPY DISCHARGE SUMMARY  Visits from Start of Care: 2  Current functional level related to goals / functional outcomes:   Refer to above clinical impression for status as of last visit on 12/29/2020. Patient cancelled x 2, no showed x 1 and failed to schedule any further visits. She has not returned to PT in >30 days, therefore will proceed with discharge from PT for this episode.   Remaining deficits:   As above. Unable to assess status at discharge due to failure to return to PT.   Education / Equipment:   HEP, Biomedical scientist instructions Plan: Patient agrees to discharge.  Patient goals were partially met. Patient is being discharged due to not returning since the last visit.  ?????     Percival Spanish, PT, MPT 03/07/21, 8:27 AM  [facility] [place], [place], [address] Phone:  [phone]   Fax:  [phone]

## 2020-12-31 ENCOUNTER — Ambulatory Visit: Payer: Medicaid Other | Admitting: Physical Therapy

## 2021-01-04 ENCOUNTER — Other Ambulatory Visit: Payer: Self-pay

## 2021-01-04 ENCOUNTER — Ambulatory Visit (INDEPENDENT_AMBULATORY_CARE_PROVIDER_SITE_OTHER): Payer: Medicaid Other | Admitting: Advanced Practice Midwife

## 2021-01-04 VITALS — BP 127/77 | HR 101 | Wt 236.0 lb

## 2021-01-04 DIAGNOSIS — Z23 Encounter for immunization: Secondary | ICD-10-CM

## 2021-01-04 DIAGNOSIS — R0789 Other chest pain: Secondary | ICD-10-CM

## 2021-01-04 DIAGNOSIS — O99891 Other specified diseases and conditions complicating pregnancy: Secondary | ICD-10-CM

## 2021-01-04 DIAGNOSIS — M549 Dorsalgia, unspecified: Secondary | ICD-10-CM

## 2021-01-04 DIAGNOSIS — Z3402 Encounter for supervision of normal first pregnancy, second trimester: Secondary | ICD-10-CM | POA: Diagnosis not present

## 2021-01-04 DIAGNOSIS — F418 Other specified anxiety disorders: Secondary | ICD-10-CM | POA: Diagnosis not present

## 2021-01-04 NOTE — Progress Notes (Signed)
   PRENATAL VISIT NOTE  Subjective:  Tonya Hart is a 30 y.o. G1P0000 at [redacted]w[redacted]d being seen today for ongoing prenatal care.  She is currently monitored for the following issues for this low-risk pregnancy and has Cigarette nicotine dependence without complication; Depression with anxiety; Anxiety state; Dysmenorrhea; Positive pregnancy test; and Supervision of normal first pregnancy on their problem list.  Patient reports pain in right rib/chest wall.  Contractions: Not present. Vag. Bleeding: None.  Movement: Present. Denies leaking of fluid.   The following portions of the patient's history were reviewed and updated as appropriate: allergies, current medications, past family history, past medical history, past social history, past surgical history and problem list.   Objective:   Vitals:   01/04/21 0827  BP: 127/77  Pulse: (!) 101  Weight: 236 lb (107 kg)    Fetal Status: Fetal Heart Rate (bpm): 143 Fundal Height: 28 cm Movement: Present     General:  Alert, oriented and cooperative. Patient is in no acute distress.  Skin: Skin is warm and dry. No rash noted.   Cardiovascular: Normal heart rate noted  Respiratory: Normal respiratory effort, no problems with respiration noted  Abdomen: Soft, gravid, appropriate for gestational age.  Pain/Pressure: Absent     Pelvic: Cervical exam deferred        Extremities: Normal range of motion.  Edema: Trace  Mental Status: Normal mood and affect. Normal behavior. Normal judgment and thought content.   Assessment and Plan:  Pregnancy: G1P0000 at [redacted]w[redacted]d  1. Encounter for supervision of normal first pregnancy in second trimester --Anticipatory guidance about next visits/weeks of pregnancy given. --next visit in 2 weeks  - 2Hr GTT w/ 1 Hr Carpenter 75 g - HIV antibody (with reflex) - CBC - RPR  2. Depression with anxiety --Pt reports doing well currently, after restarting Lexapro and Buspar on 11/09/20 --Offered integrated BH, pt  declined but will let us know if she is interested later  3. Back pain affecting pregnancy in second trimester --Improved with physical therapy during the pregnancy  4. Musculoskeletal chest pain --Pt reports pain x several weeks immediately under her right breast, superficial/shallow, that is sharp and severe, intermittent, and lasts 10 minutes to 1 hour when it occurs.  She can cause pain by pressing on the area. She has broken ribs on both sides of her chest and feels like the pain is similar, but intermittent. There is no increased pain when taking a deep breath, she denies any associated symptoms. --Pt to try ice, talk to PT about the pain, and follow up at next visit or sooner if it persists.  If there is shortness of breath or chest pain she should go to the ED or MAU. --Consult Dr Myriam Jacobson, Family Medicine MD, who agrees with plan for symptomatic treatment unless symptoms worsen.    Preterm labor symptoms and general obstetric precautions including but not limited to vaginal bleeding, contractions, leaking of fluid and fetal movement were reviewed in detail with the patient. Please refer to After Visit Summary for other counseling recommendations.   No follow-ups on file.  Future Appointments  Date Time Provider Department Center  01/05/2021  8:00 AM Darleene Cleaver, PTA [facility]  01/07/2021  8:00 AM Marry Guan, PT [facility]  02/01/2021  1:10 PM Sharyon Cable, CNM [facility]    Sharen Counter, CNM

## 2021-01-05 ENCOUNTER — Other Ambulatory Visit: Payer: Self-pay | Admitting: Advanced Practice Midwife

## 2021-01-05 ENCOUNTER — Ambulatory Visit: Payer: Medicaid Other

## 2021-01-05 DIAGNOSIS — R0789 Other chest pain: Secondary | ICD-10-CM

## 2021-01-05 LAB — CBC
HCT: 34.6 % — ABNORMAL LOW (ref 35.0–45.0)
Hemoglobin: 11.9 g/dL (ref 11.7–15.5)
MCH: 32.6 pg (ref 27.0–33.0)
MCHC: 34.4 g/dL (ref 32.0–36.0)
MCV: 94.8 fL (ref 80.0–100.0)
MPV: 11.5 fL (ref 7.5–12.5)
Platelets: 194 10*3/uL (ref 140–400)
RBC: 3.65 10*6/uL — ABNORMAL LOW (ref 3.80–5.10)
RDW: 12 % (ref 11.0–15.0)
WBC: 8.4 10*3/uL (ref 3.8–10.8)

## 2021-01-05 LAB — 2HR GTT W 1 HR, CARPENTER, 75 G
Glucose, 1 Hr, Gest: 162 mg/dL (ref 65–179)
Glucose, 2 Hr, Gest: 79 mg/dL (ref 65–152)
Glucose, Fasting, Gest: 81 mg/dL (ref 65–91)

## 2021-01-05 LAB — HIV ANTIBODY (ROUTINE TESTING W REFLEX): HIV 1&2 Ab, 4th Generation: NONREACTIVE

## 2021-01-05 LAB — RPR: RPR Ser Ql: NONREACTIVE

## 2021-01-05 MED ORDER — CYCLOBENZAPRINE HCL 5 MG PO TABS: 5.0000 mg | ORAL_TABLET | Freq: Three times a day (TID) | ORAL | 0 refills | Status: DC | PRN

## 2021-01-07 ENCOUNTER — Encounter: Payer: Medicaid Other | Admitting: Physical Therapy

## 2021-02-01 ENCOUNTER — Ambulatory Visit (INDEPENDENT_AMBULATORY_CARE_PROVIDER_SITE_OTHER): Payer: Medicaid Other | Admitting: Certified Nurse Midwife

## 2021-02-01 ENCOUNTER — Other Ambulatory Visit: Payer: Self-pay

## 2021-02-01 ENCOUNTER — Encounter: Payer: Self-pay | Admitting: Certified Nurse Midwife

## 2021-02-01 VITALS — BP 115/78 | HR 104 | Wt 236.0 lb

## 2021-02-01 DIAGNOSIS — Z3400 Encounter for supervision of normal first pregnancy, unspecified trimester: Secondary | ICD-10-CM

## 2021-02-01 DIAGNOSIS — Z3A32 32 weeks gestation of pregnancy: Secondary | ICD-10-CM

## 2021-02-01 DIAGNOSIS — F418 Other specified anxiety disorders: Secondary | ICD-10-CM

## 2021-02-01 NOTE — Progress Notes (Signed)
   PRENATAL VISIT NOTE  Subjective:  Tonya Hart is a 30 y.o. G1P0000 at [redacted]w[redacted]d being seen today for ongoing prenatal care.  She is currently monitored for the following issues for this low-risk pregnancy and has Cigarette nicotine dependence without complication; Depression with anxiety; Anxiety state; Dysmenorrhea; Positive pregnancy test; and Supervision of normal first pregnancy on their problem list.  Patient reports no complaints.  Contractions: Not present. Vag. Bleeding: None.  Movement: Present. Denies leaking of fluid.   The following portions of the patient's history were reviewed and updated as appropriate: allergies, current medications, past family history, past medical history, past social history, past surgical history and problem list.   Objective:   Vitals:   02/01/21 1305  BP: 115/78  Pulse: (!) 104  Weight: 236 lb (107 kg)    Fetal Status: Fetal Heart Rate (bpm): 134 Fundal Height: 32 cm Movement: Present     General:  Alert, oriented and cooperative. Patient is in no acute distress.  Skin: Skin is warm and dry. No rash noted.   Cardiovascular: Normal heart rate noted  Respiratory: Normal respiratory effort, no problems with respiration noted  Abdomen: Soft, gravid, appropriate for gestational age.  Pain/Pressure: Absent     Pelvic: Cervical exam deferred        Extremities: Normal range of motion.  Edema: Trace  Mental Status: Normal mood and affect. Normal behavior. Normal judgment and thought content.   Assessment and Plan:  Pregnancy: G1P0000 at [redacted]w[redacted]d 1. [redacted] weeks gestation of pregnancy - patient doing well, no complaints or concerns at this time  - Discussed counter pressure with pillow while coughing due to rib pain   2. Supervision of normal first pregnancy, antepartum - Routine prenatal care - Anticipatory guidance on upcoming appointments with next being GBS screening, discussed recommendation of antibiotics during labor if GBS positive - patient  verbalizes understanding  - Educated and discussed pediatricians in [facility]- list given to patient  3. Depression with anxiety - doing well on medication at this time   Preterm labor symptoms and general obstetric precautions including but not limited to vaginal bleeding, contractions, leaking of fluid and fetal movement were reviewed in detail with the patient. Please refer to After Visit Summary for other counseling recommendations.   Return in about 4 weeks (around 03/01/2021) for LROB, GBS, in person.  Future Appointments  Date Time Provider Department Center  03/01/2021  1:10 PM Rolm Bookbinder, CNM [facility]    Sharyon Cable, CNM

## 2021-02-01 NOTE — Patient Instructions (Addendum)
Group B Streptococcus Test During Pregnancy Why am I having this test? Routine testing, also called screening, for group B streptococcus (GBS) is recommended for all pregnant women between the 36th and 37th week of pregnancy. GBS is a type of bacteria that can be passed from mother to baby during childbirth. Screening will help guide whether or not you will need treatment during labor and delivery to prevent complications such as:  An infection in your uterus during labor.  An infection in your uterus after delivery.  A serious infection in your baby after delivery, such as pneumonia, meningitis, or sepsis. GBS screening is not often done before 36 weeks of pregnancy unless you go into labor prematurely. What happens if I have group B streptococcus? If testing shows that you have GBS, your health care provider will recommend treatment with IV antibiotics during labor and delivery. This treatment significantly decreases the risk of complications for you and your baby. If you have a planned C-section and you have GBS, you may not need to be treated with antibiotics because GBS is usually passed to babies after labor starts and your water breaks. If you are in labor or your water breaks before your C-section, it is possible for GBS to get into your uterus and be passed to your baby, so you might need treatment. Is there a chance I may not need to be tested? You may not need to be tested for GBS if:  You have a urine test that shows GBS before 36 to 37 weeks.  You had a baby with GBS infection after a previous delivery. In these cases, you will automatically be treated for GBS during labor and delivery. What is being tested? This test is done to check if you have group B streptococcus in your vagina or rectum. What kind of sample is taken? To collect samples for this test, your health care provider will swab your vagina and rectum with a cotton swab. The sample is then sent to the lab to see if  GBS is present. What happens during the test?  You will remove your clothing from the waist down.  You will lie down on an exam table in the same position as you would for a pelvic exam.  Your health care provider will swab your vagina and rectum to collect samples for a culture test.  You will be able to go home after the test and do all your usual activities.   How are the results reported? The test results are reported as positive or negative. What do the results mean?  A positive test means you are at risk for passing GBS to your baby during labor and delivery. Your health care provider will recommend that you are treated with an IV antibiotic during labor and delivery.  A negative test means you are at very low risk of passing GBS to your baby. There is still a low risk of passing GBS to your baby because sometimes test results may report that you do not have a condition when you do (false-negative result) or there is a chance that you may become infected with GBS after the test is done. You most likely will not need to be treated with an antibiotic during labor and delivery. Talk with your health care provider about what your results mean. Questions to ask your health care provider Ask your health care provider, or the department that is doing the test:  When will my results be ready?  How will   I get my results?  What are my treatment options? Summary  Routine testing (screening) for group B streptococcus (GBS) is recommended for all pregnant women between the 36th and 37th week of pregnancy.  GBS is a type of bacteria that can be passed from mother to baby during childbirth.  If testing shows that you have GBS, your health care provider will recommend that you are treated with IV antibiotics during labor and delivery. This treatment almost always prevents infection in newborns. This information is not intended to replace advice given to you by your health care provider. Make  sure you discuss any questions you have with your health care provider. Document Revised: 09/07/2020 Document Reviewed: 12/04/2018 Elsevier Patient Education  2021 [employer].  Here is a Horticulturist, commercial of pediatricians in [place]:  [facility]: [address] [place], [place] [address] [phone]  [facility]: [address] [place], [place] [address] [phone]  [facility]: [address] [place], [place] [address] [phone]  [facility]:  [address] [place], [place] [address] [phone]

## 2021-03-01 ENCOUNTER — Other Ambulatory Visit (HOSPITAL_COMMUNITY)
Admission: RE | Admit: 2021-03-01 | Discharge: 2021-03-01 | Disposition: A | Discharge status: Home / Self Care | Payer: Medicaid Other | Source: Ambulatory Visit

## 2021-03-01 ENCOUNTER — Ambulatory Visit (INDEPENDENT_AMBULATORY_CARE_PROVIDER_SITE_OTHER): Payer: Medicaid Other

## 2021-03-01 ENCOUNTER — Other Ambulatory Visit: Payer: Self-pay

## 2021-03-01 VITALS — BP 115/77 | HR 91 | Wt 247.0 lb

## 2021-03-01 DIAGNOSIS — Z3480 Encounter for supervision of other normal pregnancy, unspecified trimester: Secondary | ICD-10-CM | POA: Insufficient documentation

## 2021-03-01 DIAGNOSIS — Z3A36 36 weeks gestation of pregnancy: Secondary | ICD-10-CM

## 2021-03-01 NOTE — Patient Instructions (Addendum)
Group B Streptococcus Infection During Pregnancy Group B Streptococcus (GBS) is a type of bacteria that is often found in healthy people. It is commonly found in the rectum, vagina, and intestines. In people who are healthy and not pregnant, the bacteria rarely cause serious illness or complications. However, women who test positive for GBS during pregnancy can pass the bacteria to the baby during childbirth. This can cause serious infection in the baby after birth. Women with GBS may also have infections during their pregnancy or soon after childbirth. The infections include urinary tract infections (UTIs) or infections of the uterus. GBS also increases a woman's risk of complications during pregnancy, such as early labor or delivery, miscarriage, or stillbirth. Routine testing for GBS is recommended for all pregnant women. What are the causes? This condition is caused by bacteria called Streptococcus agalactiae. What increases the risk? You may have a higher risk for GBS infection during pregnancy if you had one during a past pregnancy. What are the signs or symptoms? In most cases, GBS infection does not cause symptoms in pregnant women. If symptoms exist, they may include:  Labor that starts before the 37th week of pregnancy.  A UTI or bladder infection. This may cause a fever, frequent urination, or pain and burning during urination.  Fever during labor. There can also be a rapid heartbeat in the mother or baby. Rare but serious symptoms of a GBS infection in women include:  Blood infection (septicemia). This may cause fever, chills, or confusion.  Lung infection (pneumonia). This may cause fever, chills, cough, rapid breathing, chest pain, or difficulty breathing.  Bone, joint, skin, or soft tissue infection. How is this diagnosed? You may be screened for GBS between week 35 and week 37 of pregnancy. If you have symptoms of preterm labor, you may be screened earlier. This condition is  diagnosed based on lab test results from:  A swab of fluid from the vagina and rectum.  A urine sample. How is this treated? This condition is treated with antibiotic medicine. Antibiotic medicine may be given:  To you when you go into labor, or as soon as your water breaks. The medicines will continue until after you give birth. If you are having a cesarean delivery, you do not need antibiotics unless your water has broken.  To your baby, if he or she requires treatment. Your health care provider will check your baby to decide if he or she needs antibiotics to prevent a serious infection.   Follow these instructions at home:  Take over-the-counter and prescription medicines only as told by your health care provider.  Take your antibiotic medicine as told by your health care provider. Do not stop taking the antibiotic even if you start to feel better.  Keep all pre-birth (prenatal) visits and follow-up visits as told by your health care provider. This is important. Contact a health care provider if:  You have pain or burning when you urinate.  You have to urinate more often than usual.  You have a fever or chills.  You develop a bad-smelling vaginal discharge. Get help right away if:  Your water breaks.  You go into labor.  You have severe pain in your abdomen.  You have difficulty breathing.  You have chest pain. These symptoms may represent a serious problem that is an emergency. Do not wait to see if the symptoms will go away. Get medical help right away. Call your local emergency services (911 in the [place] Do not drive   yourself to the hospital. Summary  GBS is a type of bacteria that is common in healthy people.  During pregnancy, colonization with GBS can cause serious complications for you or your baby.  Your health care provider will screen you between 35 and 37 weeks of pregnancy to determine if you are colonized with GBS.  If you are colonized with GBS during  pregnancy, your health care provider will recommend antibiotics through an IV during labor.  After delivery, your baby will be evaluated for complications related to potential GBS infection and may require antibiotics to prevent a serious infection. This information is not intended to replace advice given to you by your health care provider. Make sure you discuss any questions you have with your health care provider. Document Revised: 09/07/2020 Document Reviewed: 06/02/2019 Elsevier Patient Education  2021 Elsevier Inc.  Safe Medications in Pregnancy   Acne: Benzoyl Peroxide Salicylic Acid  Backache/Headache: Tylenol: 2 regular strength every 4 hours OR              2 Extra strength every 6 hours  Colds/Coughs/Allergies: Benadryl (alcohol free) 25 mg every 6 hours as needed Breath right strips Claritin Cepacol throat lozenges Chloraseptic throat spray Cold-Eeze- up to three times per day Cough drops, alcohol free Flonase (by prescription only) Guaifenesin Mucinex Robitussin DM (plain only, alcohol free) Saline nasal spray/drops Sudafed (pseudoephedrine) & Actifed ** use only after [redacted] weeks gestation and if you do not have high blood pressure Tylenol Vicks Vaporub Zinc lozenges Zyrtec   Constipation: Colace Ducolax suppositories Fleet enema Glycerin suppositories Metamucil Milk of magnesia Miralax Senokot Smooth move tea  Diarrhea: Kaopectate Imodium A-D  *NO pepto Bismol  Hemorrhoids: Anusol Anusol HC Preparation H Tucks  Indigestion: Tums Maalox Mylanta Zantac  Pepcid  Insomnia: Benadryl (alcohol free) 25mg  every 6 hours as needed Tylenol PM Unisom, no Gelcaps  Leg Cramps: Tums MagGel  Nausea/Vomiting:  Bonine Dramamine Emetrol Ginger extract Sea bands Meclizine  Nausea medication to take during pregnancy:  Unisom (doxylamine succinate 25 mg tablets) Take one tablet daily at bedtime. If symptoms are not adequately controlled, the  dose can be increased to a maximum recommended dose of two tablets daily (1/2 tablet in the morning, 1/2 tablet mid-afternoon and one at bedtime). Vitamin B6 100mg  tablets. Take one tablet twice a day (up to 200 mg per day).  Skin Rashes: Aveeno products Benadryl cream or 25mg  every 6 hours as needed Calamine Lotion 1% cortisone cream  Yeast infection: Gyne-lotrimin 7 Monistat 7   **If taking multiple medications, please check labels to avoid duplicating the same active ingredients **take medication as directed on the label ** Do not exceed 4000 mg of tylenol in 24 hours **Do not take medications that contain aspirin or ibuprofen    Considering Waterbirth? Guide for patients at [facility]) Why consider waterbirth? . Gentle birth for babies  . Less pain medicine used in labor  . May allow for passive descent/less pushing  . May reduce perineal tears  . More mobility and instinctive maternal position changes  . Increased maternal relaxation   Is waterbirth safe? What are the risks of infection, drowning or other complications? . Infection:  Very low risk (3.7 % for tub vs 4.8% for bed)  . 7 in 8000 waterbirths with documented infection  . Poorly cleaned equipment most common cause  . Slightly lower group B strep transmission rate  . Drowning  . Maternal:  . Very low risk  .  Related to seizures or fainting  . Newborn:  Marland Kitchen Very low risk. No evidence of increased risk of respiratory problems in multiple large studies  . Physiological protection from breathing under water  . Avoid underwater birth if there are any fetal complications  . Once baby's head is out of the water, keep it out.  . Birth complication  . Some reports of cord trauma, but risk decreased by bringing baby to surface gradually  . No evidence of increased risk of shoulder dystocia. Mothers can usually change positions faster in water than in a bed, possibly aiding the maneuvers to  free the shoulder.   There are 2 things you MUST do to have a waterbirth with [name]: 1. Attend a waterbirth class at [facility]   a. 3rd Wednesday of every month from 7-9 pm (virtual during COVID) b. Free c. Register online at [email or web address] or [email or web address] or by calling [phone] d. Bring [place] the certificate from the class to your prenatal appointment or send via MyChart 2. Meet with a midwife at 36 weeks* to see if you can still plan a waterbirth and to sign the consent.   *We also recommend that you schedule as many of your prenatal visits with a midwife as possible.    Helpful information: . You may want to bring a bathing suit top to the hospital to wear during labor but this is optional.  All other supplies are provided by the hospital. . Please arrive at the hospital with signs of active labor, and do not wait at home until late in labor. It takes 45 min- 2 hours for COVID testing, fetal monitoring, and check in to your room to take place, plus transport and filling of the waterbirth tub.    Things that would prevent you from having a waterbirth: . Unknown or Positive COVID-19 diagnosis upon admission to hospital* . Premature, <37wks  . Previous cesarean birth  . Presence of thick meconium-stained fluid  . Multiple gestation (Twins, triplets, etc.)  . Uncontrolled diabetes or gestational diabetes requiring medication  . Hypertension diagnosed in pregnancy or preexisting hypertension (gestational hypertension, preeclampsia, or chronic hypertension) . Fetal growth restriction (your baby measures less than 10th percentile on ultrasound) . Heavy vaginal bleeding  . Non-reassuring fetal heart rate  . Active infection (MRSA, etc.). Group B Strep is NOT a contraindication for waterbirth.  . If your labor has to be induced and induction method requires continuous monitoring of the baby's heart rate  . Other risks/issues identified by  your obstetrical provider   Please remember that birth is unpredictable. Under certain unforeseeable circumstances your provider may advise against giving birth in the tub. These decisions will be made on a case-by-case basis and with the safety of you and your baby as our highest priority.   *Please remember that in order to have a waterbirth, you must test Negative to COVID-19 upon admission to the hospital.  Updated 02/28/21

## 2021-03-01 NOTE — Progress Notes (Signed)
   PRENATAL VISIT NOTE  Subjective:  Tonya Hart is a 30 y.o. G1P0000 at [redacted]w[redacted]d being seen today for ongoing prenatal care.  She is currently monitored for the following issues for this low-risk pregnancy and has Cigarette nicotine dependence without complication; Depression with anxiety; Anxiety state; Dysmenorrhea; Positive pregnancy test; and Supervision of normal first pregnancy on their problem list.  Patient reports no complaints.  Contractions: Not present. Vag. Bleeding: None.  Movement: Present. Denies leaking of fluid.   The following portions of the patient's history were reviewed and updated as appropriate: allergies, current medications, past family history, past medical history, past social history, past surgical history and problem list.   Objective:   Vitals:   03/01/21 1304  BP: 115/77  Pulse: 91  Weight: 247 lb (112 kg)    Fetal Status: Fetal Heart Rate (bpm): 141 Fundal Height: 37 cm Movement: Present     General:  Alert, oriented and cooperative. Patient is in no acute distress.  Skin: Skin is warm and dry. No rash noted.   Cardiovascular: Normal heart rate noted  Respiratory: Normal respiratory effort, no problems with respiration noted  Abdomen: Soft, gravid, appropriate for gestational age.  Pain/Pressure: Present     Pelvic: Cervical exam deferred        Extremities: Normal range of motion.  Edema: Trace  Mental Status: Normal mood and affect. Normal behavior. Normal judgment and thought content.   Assessment and Plan:  Pregnancy: G1P0000 at [redacted]w[redacted]d 1. Supervision of other normal pregnancy, antepartum -Reviewed GBS testing, patient declined cervical exam. Vertex presentation by leopolds - Patient considering waterbirth/laboring in tub. Discussed requirements at length.  - Preferences for labor reviewed and questions answered.  - Culture, beta strep (group b only) - Cervicovaginal ancillary only( [place])  Preterm labor symptoms and general obstetric  precautions including but not limited to vaginal bleeding, contractions, leaking of fluid and fetal movement were reviewed in detail with the patient. Please refer to After Visit Summary for other counseling recommendations.   Return in about 1 week (around 03/08/2021) for Return OB visit.  Future Appointments  Date Time Provider Department Center  03/15/2021  2:20 PM Leftwich-Kirby, Wilmer Floor, CNM [facility]    Rolm Bookbinder, CNM

## 2021-03-02 LAB — CERVICOVAGINAL ANCILLARY ONLY
Chlamydia: NEGATIVE
Comment: NEGATIVE
Comment: NORMAL
Neisseria Gonorrhea: NEGATIVE

## 2021-03-04 LAB — CULTURE, BETA STREP (GROUP B ONLY)
MICRO NUMBER:: 11763432
SPECIMEN QUALITY:: ADEQUATE

## 2021-03-15 ENCOUNTER — Other Ambulatory Visit: Payer: Self-pay

## 2021-03-15 ENCOUNTER — Ambulatory Visit (INDEPENDENT_AMBULATORY_CARE_PROVIDER_SITE_OTHER): Payer: Medicaid Other | Admitting: Advanced Practice Midwife

## 2021-03-15 VITALS — BP 117/76 | HR 90 | Wt 251.0 lb

## 2021-03-15 DIAGNOSIS — Z3A38 38 weeks gestation of pregnancy: Secondary | ICD-10-CM

## 2021-03-15 DIAGNOSIS — Z3480 Encounter for supervision of other normal pregnancy, unspecified trimester: Secondary | ICD-10-CM

## 2021-03-15 NOTE — Progress Notes (Signed)
   PRENATAL VISIT NOTE  Subjective:  Tonya Hart is a 30 y.o. G1P0000 at [redacted]w[redacted]d being seen today for ongoing prenatal care.  She is currently monitored for the following issues for this low-risk pregnancy and has Cigarette nicotine dependence without complication; Depression with anxiety; Anxiety state; Dysmenorrhea; Positive pregnancy test; and Supervision of normal first pregnancy on their problem list.  Patient reports no complaints.  Contractions: Not present. Vag. Bleeding: None.  Movement: Present. Denies leaking of fluid.   The following portions of the patient's history were reviewed and updated as appropriate: allergies, current medications, past family history, past medical history, past social history, past surgical history and problem list.   Objective:   Vitals:   03/15/21 1409  BP: 117/76  Pulse: 90  Weight: 251 lb (113.9 kg)    Fetal Status: Fetal Heart Rate (bpm): 144 Fundal Height: 38 cm Movement: Present  Presentation: Vertex  General:  Alert, oriented and cooperative. Patient is in no acute distress.  Skin: Skin is warm and dry. No rash noted.   Cardiovascular: Normal heart rate noted  Respiratory: Normal respiratory effort, no problems with respiration noted  Abdomen: Soft, gravid, appropriate for gestational age.  Pain/Pressure: Present     Pelvic: Cervical exam performed in the presence of a chaperone Dilation: 1 Effacement (%): 50 Station: -2  Extremities: Normal range of motion.  Edema: Trace  Mental Status: Normal mood and affect. Normal behavior. Normal judgment and thought content.   Assessment and Plan:  Pregnancy: G1P0000 at [redacted]w[redacted]d 1. Supervision of other normal pregnancy, antepartum --Anticipatory guidance about next visits/weeks of pregnancy given. --Pt interested in waterbirth tub for labor, may get out and get epidural later in labor.  Class certificate scanned today, consent signed and witnessed today.  --Questions answered about admission for  labor, etc --Reviewed labor readiness and labor precautions --Next appt in 1 week  2. [redacted] weeks gestation of pregnancy   Term labor symptoms and general obstetric precautions including but not limited to vaginal bleeding, contractions, leaking of fluid and fetal movement were reviewed in detail with the patient. Please refer to After Visit Summary for other counseling recommendations.   Return in about 1 week (around 03/22/2021).  Future Appointments  Date Time Provider Department Center  03/22/2021  1:10 PM Leftwich-Kirby, Wilmer Floor, CNM [facility]    Sharen Counter, CNM

## 2021-03-15 NOTE — Patient Instructions (Signed)
Things to Try After 37 weeks to Encourage Labor/Get Ready for Labor:   1.  Try the Miles Circuit at www.milescircuit.com daily to improve baby's position and encourage the onset of labor.  2. Walk a little and rest a little every day.  Change positions often.  3. Cervical Ripening: May try one or both a. Red Raspberry Leaf capsules or tea:  two 300mg or 400mg tablets with each meal, 2-3 times a day, or 1-3 cups of tea daily  Potential Side Effects Of Raspberry Leaf:  Most women do not experience any side effects from drinking raspberry leaf tea. However, nausea and loose stools are possible   b. Evening Primrose Oil capsules: take 1 capsule by mouth and place one capsule in the vagina every night.    Some of the potential side effects:  Upset stomach  Loose stools or diarrhea  Headaches  Nausea  4. Sex can also help the cervix ripen and encourage labor onset.    Labor Precautions Reasons to come to [facility] at [facility]:  1.  Contractions are  5 minutes apart or less, each last 1 minute, these have been going on for 1-2 hours, and you cannot walk or talk during them 2.  You have a large gush of fluid, or a trickle of fluid that will not stop and you have to wear a pad 3.  You have bleeding that is bright red, heavier than spotting--like menstrual bleeding (spotting can be normal in early labor or after a check of your cervix) 4.  You do not feel the baby moving like he/she normally does 

## 2021-03-22 ENCOUNTER — Ambulatory Visit (INDEPENDENT_AMBULATORY_CARE_PROVIDER_SITE_OTHER): Payer: Medicaid Other | Admitting: Obstetrics and Gynecology

## 2021-03-22 ENCOUNTER — Other Ambulatory Visit: Payer: Self-pay

## 2021-03-22 VITALS — BP 132/82 | HR 101 | Wt 260.0 lb

## 2021-03-22 DIAGNOSIS — Z3400 Encounter for supervision of normal first pregnancy, unspecified trimester: Secondary | ICD-10-CM

## 2021-03-22 NOTE — Patient Instructions (Signed)

## 2021-03-22 NOTE — Progress Notes (Signed)
   PRENATAL VISIT NOTE  Subjective:  Tonya Hart is a 30 y.o. G1P0000 at [redacted]w[redacted]d being seen today for ongoing prenatal care.  She is currently monitored for the following issues for this low-risk pregnancy and has Cigarette nicotine dependence without complication; Depression with anxiety; Anxiety state; Dysmenorrhea; Positive pregnancy test; and Supervision of normal first pregnancy on their problem list.  Patient reports no complaints.  Contractions: Not present. Vag. Bleeding: None.  Movement: Present. Denies leaking of fluid.   The following portions of the patient's history were reviewed and updated as appropriate: allergies, current medications, past family history, past medical history, past social history, past surgical history and problem list.   Objective:   Vitals:   03/22/21 1303  BP: 132/82  Pulse: (!) 101  Weight: 260 lb (117.9 kg)    Fetal Status: Fetal Heart Rate (bpm): 149   Movement: Present     General:  Alert, oriented and cooperative. Patient is in no acute distress.  Skin: Skin is warm and dry. No rash noted.   Cardiovascular: Normal heart rate noted  Respiratory: Normal respiratory effort, no problems with respiration noted  Abdomen: Soft, gravid, appropriate for gestational age.  Pain/Pressure: Present     Pelvic: Cervical exam deferred        Extremities: Normal range of motion.  Edema: Trace  Mental Status: Normal mood and affect. Normal behavior. Normal judgment and thought content.   Assessment and Plan:  Pregnancy: G1P0000 at [redacted]w[redacted]d  1. Supervision of normal first pregnancy, antepartum  Highly desires natural delivery.  Briefly discussed induction plan/ process Return OB visit next Monday for NST and AFI. If still pregnant will schedule induction for 40.5-41 weeks. She is very agreeable to this plan.  Labor precautions  GBS negative     Term labor symptoms and general obstetric precautions including but not limited to vaginal bleeding,  contractions, leaking of fluid and fetal movement were reviewed in detail with the patient. Please refer to After Visit Summary for other counseling recommendations.   Return in about 1 week (around 03/29/2021), or 5/9 in office visit for NST and AFI- will schedule induction if needed that day..  Future Appointments  Date Time Provider Department Center  03/28/2021  9:00 AM [facility] NURSE [facility]    Venia Carbon, NP

## 2021-03-28 ENCOUNTER — Other Ambulatory Visit: Payer: Self-pay

## 2021-03-28 ENCOUNTER — Telehealth (HOSPITAL_COMMUNITY): Payer: Self-pay | Admitting: *Deleted

## 2021-03-28 ENCOUNTER — Encounter (HOSPITAL_COMMUNITY): Payer: Self-pay | Admitting: *Deleted

## 2021-03-28 ENCOUNTER — Ambulatory Visit (INDEPENDENT_AMBULATORY_CARE_PROVIDER_SITE_OTHER): Payer: Medicaid Other | Admitting: *Deleted

## 2021-03-28 VITALS — BP 127/85 | HR 87 | Wt 258.0 lb

## 2021-03-28 DIAGNOSIS — Z3A40 40 weeks gestation of pregnancy: Secondary | ICD-10-CM

## 2021-03-28 DIAGNOSIS — O480 Post-term pregnancy: Secondary | ICD-10-CM

## 2021-03-28 NOTE — Telephone Encounter (Signed)
Preadmission screen  

## 2021-03-28 NOTE — Progress Notes (Signed)
Pt hee for NST and AFI.  NST is reactive and reviewed @ bedside by Dr Penne Lash  AFI is 18.43cm.  IOL scheduled for this week.

## 2021-03-29 ENCOUNTER — Other Ambulatory Visit: Payer: Self-pay | Admitting: Advanced Practice Midwife

## 2021-03-29 ENCOUNTER — Other Ambulatory Visit (HOSPITAL_COMMUNITY)
Admission: RE | Admit: 2021-03-29 | Discharge: 2021-03-29 | Disposition: A | Discharge status: Home / Self Care | Payer: Medicaid Other | Source: Ambulatory Visit | Attending: Family Medicine | Admitting: Family Medicine

## 2021-03-29 DIAGNOSIS — Z01812 Encounter for preprocedural laboratory examination: Secondary | ICD-10-CM | POA: Diagnosis not present

## 2021-03-29 DIAGNOSIS — Z20822 Contact with and (suspected) exposure to covid-19: Secondary | ICD-10-CM | POA: Insufficient documentation

## 2021-03-29 LAB — SARS CORONAVIRUS 2 (TAT 6-24 HRS): SARS Coronavirus 2: NEGATIVE

## 2021-03-31 ENCOUNTER — Other Ambulatory Visit: Payer: Self-pay

## 2021-03-31 ENCOUNTER — Inpatient Hospital Stay (HOSPITAL_COMMUNITY): Payer: Medicaid Other

## 2021-03-31 ENCOUNTER — Encounter (HOSPITAL_COMMUNITY): Payer: Self-pay | Admitting: Anesthesiology

## 2021-03-31 ENCOUNTER — Inpatient Hospital Stay (HOSPITAL_COMMUNITY): Payer: Medicaid Other | Admitting: Anesthesiology

## 2021-03-31 ENCOUNTER — Inpatient Hospital Stay (HOSPITAL_COMMUNITY)
Admission: AD | Admit: 2021-03-31 | Discharge: 2021-04-03 | DRG: 807 | Disposition: A | Discharge status: Home / Self Care | Payer: Medicaid Other | Attending: Obstetrics and Gynecology | Admitting: Obstetrics and Gynecology

## 2021-03-31 ENCOUNTER — Encounter (HOSPITAL_COMMUNITY): Payer: Self-pay | Admitting: Obstetrics & Gynecology

## 2021-03-31 DIAGNOSIS — O134 Gestational [pregnancy-induced] hypertension without significant proteinuria, complicating childbirth: Secondary | ICD-10-CM | POA: Diagnosis present

## 2021-03-31 DIAGNOSIS — F418 Other specified anxiety disorders: Secondary | ICD-10-CM | POA: Diagnosis present

## 2021-03-31 DIAGNOSIS — O99344 Other mental disorders complicating childbirth: Secondary | ICD-10-CM | POA: Diagnosis present

## 2021-03-31 DIAGNOSIS — O139 Gestational [pregnancy-induced] hypertension without significant proteinuria, unspecified trimester: Secondary | ICD-10-CM | POA: Diagnosis not present

## 2021-03-31 DIAGNOSIS — O165 Unspecified maternal hypertension, complicating the puerperium: Secondary | ICD-10-CM | POA: Diagnosis present

## 2021-03-31 DIAGNOSIS — F17210 Nicotine dependence, cigarettes, uncomplicated: Secondary | ICD-10-CM | POA: Diagnosis present

## 2021-03-31 DIAGNOSIS — Z3400 Encounter for supervision of normal first pregnancy, unspecified trimester: Secondary | ICD-10-CM

## 2021-03-31 DIAGNOSIS — Z23 Encounter for immunization: Secondary | ICD-10-CM | POA: Diagnosis not present

## 2021-03-31 DIAGNOSIS — O480 Post-term pregnancy: Principal | ICD-10-CM | POA: Diagnosis present

## 2021-03-31 DIAGNOSIS — Z3A40 40 weeks gestation of pregnancy: Secondary | ICD-10-CM

## 2021-03-31 DIAGNOSIS — O99334 Smoking (tobacco) complicating childbirth: Secondary | ICD-10-CM | POA: Diagnosis present

## 2021-03-31 DIAGNOSIS — F411 Generalized anxiety disorder: Secondary | ICD-10-CM | POA: Diagnosis present

## 2021-03-31 LAB — COMPREHENSIVE METABOLIC PANEL
ALT: 12 U/L (ref 0–44)
AST: 14 U/L — ABNORMAL LOW (ref 15–41)
Albumin: 2.8 g/dL — ABNORMAL LOW (ref 3.5–5.0)
Alkaline Phosphatase: 189 U/L — ABNORMAL HIGH (ref 38–126)
Anion gap: 8 (ref 5–15)
BUN: 6 mg/dL (ref 6–20)
CO2: 23 mmol/L (ref 22–32)
Calcium: 9.1 mg/dL (ref 8.9–10.3)
Chloride: 105 mmol/L (ref 98–111)
Creatinine, Ser: 0.61 mg/dL (ref 0.44–1.00)
GFR, Estimated: >60 mL/min (ref >60)
Glucose, Bld: 78 mg/dL (ref 70–99)
Potassium: 4 mmol/L (ref 3.5–5.1)
Sodium: 136 mmol/L (ref 135–145)
Total Bilirubin: 0.7 mg/dL (ref 0.3–1.2)
Total Protein: 5.6 g/dL — ABNORMAL LOW (ref 6.5–8.1)

## 2021-03-31 LAB — PROTEIN / CREATININE RATIO, URINE
Creatinine, Urine: 132.05 mg/dL
Protein Creatinine Ratio: 0.09 mg/mg{Cre} (ref 0.00–0.15)
Total Protein, Urine: 12 mg/dL

## 2021-03-31 LAB — CBC
HCT: 38 % (ref 36.0–46.0)
Hemoglobin: 13.1 g/dL (ref 12.0–15.0)
MCH: 32.2 pg (ref 26.0–34.0)
MCHC: 34.5 g/dL (ref 30.0–36.0)
MCV: 93.4 fL (ref 80.0–100.0)
Platelets: 195 10*3/uL (ref 150–400)
RBC: 4.07 MIL/uL (ref 3.87–5.11)
RDW: 13.3 % (ref 11.5–15.5)
WBC: 10.2 10*3/uL (ref 4.0–10.5)
nRBC: 0 % (ref 0.0–0.2)

## 2021-03-31 LAB — TYPE AND SCREEN
ABO/RH(D): O POS
Antibody Screen: NEGATIVE

## 2021-03-31 MED ORDER — LIDOCAINE HCL (PF) 1 % IJ SOLN: 30.0000 mL | INTRAMUSCULAR | Status: DC | PRN

## 2021-03-31 MED ORDER — OXYCODONE-ACETAMINOPHEN 5-325 MG PO TABS: 1.0000 | ORAL_TABLET | ORAL | Status: DC | PRN

## 2021-03-31 MED ORDER — DIPHENHYDRAMINE HCL 50 MG/ML IJ SOLN: 12.5000 mg | INTRAMUSCULAR | Status: DC | PRN

## 2021-03-31 MED ORDER — TERBUTALINE SULFATE 1 MG/ML IJ SOLN: 0.2500 mg | Freq: Once | INTRAMUSCULAR | Status: DC | PRN

## 2021-03-31 MED ORDER — OXYTOCIN BOLUS FROM INFUSION
333.0000 mL | Freq: Once | INTRAVENOUS | Status: AC
  Administered 2021-04-01: 333 mL via INTRAVENOUS

## 2021-03-31 MED ORDER — LIDOCAINE HCL (PF) 1 % IJ SOLN
INTRAMUSCULAR | Status: DC | PRN
  Administered 2021-03-31: 8 mL via EPIDURAL

## 2021-03-31 MED ORDER — EPHEDRINE 5 MG/ML INJ: 10.0000 mg | INTRAVENOUS | Status: DC | PRN

## 2021-03-31 MED ORDER — ACETAMINOPHEN 325 MG PO TABS: 650.0000 mg | ORAL_TABLET | ORAL | Status: DC | PRN

## 2021-03-31 MED ORDER — SOD CITRATE-CITRIC ACID 500-334 MG/5ML PO SOLN
30.0000 mL | ORAL | Status: DC | PRN
Start: 2021-03-31 — End: 2021-04-01
  Administered 2021-04-01: 30 mL via ORAL
  Filled 2021-03-31: qty 15

## 2021-03-31 MED ORDER — OXYTOCIN-SODIUM CHLORIDE 30-0.9 UT/500ML-% IV SOLN
2.5000 [IU]/h | INTRAVENOUS | Status: DC
  Administered 2021-04-01: 2.5 [IU]/h via INTRAVENOUS

## 2021-03-31 MED ORDER — PHENYLEPHRINE 40 MCG/ML (10ML) SYRINGE FOR IV PUSH (FOR BLOOD PRESSURE SUPPORT): 80.0000 ug | PREFILLED_SYRINGE | INTRAVENOUS | Status: DC | PRN

## 2021-03-31 MED ORDER — FENTANYL-BUPIVACAINE-NACL 0.5-0.125-0.9 MG/250ML-% EP SOLN
12.0000 mL/h | EPIDURAL | Status: DC | PRN
  Filled 2021-03-31: qty 250

## 2021-03-31 MED ORDER — LACTATED RINGERS IV SOLN
500.0000 mL | Freq: Once | INTRAVENOUS | Status: AC
  Administered 2021-03-31: 500 mL via INTRAVENOUS

## 2021-03-31 MED ORDER — FENTANYL-BUPIVACAINE-NACL 0.5-0.125-0.9 MG/250ML-% EP SOLN
EPIDURAL | Status: DC | PRN
  Administered 2021-03-31: 15 mL/h via EPIDURAL

## 2021-03-31 MED ORDER — OXYCODONE-ACETAMINOPHEN 5-325 MG PO TABS: 2.0000 | ORAL_TABLET | ORAL | Status: DC | PRN

## 2021-03-31 MED ORDER — FENTANYL CITRATE (PF) 100 MCG/2ML IJ SOLN: 100.0000 ug | INTRAMUSCULAR | Status: DC | PRN

## 2021-03-31 MED ORDER — MISOPROSTOL 25 MCG QUARTER TABLET: 25.0000 ug | ORAL_TABLET | ORAL | Status: DC | PRN

## 2021-03-31 MED ORDER — LACTATED RINGERS IV SOLN: INTRAVENOUS | Status: DC

## 2021-03-31 MED ORDER — ONDANSETRON HCL 4 MG/2ML IJ SOLN
4.0000 mg | Freq: Four times a day (QID) | INTRAMUSCULAR | Status: DC | PRN
Start: 2021-03-31 — End: 2021-04-01
  Administered 2021-03-31 – 2021-04-01 (×2): 4 mg via INTRAVENOUS
  Filled 2021-03-31 (×2): qty 2

## 2021-03-31 MED ORDER — OXYTOCIN-SODIUM CHLORIDE 30-0.9 UT/500ML-% IV SOLN
1.0000 m[IU]/min | INTRAVENOUS | Status: DC
  Administered 2021-03-31: 2 m[IU]/min via INTRAVENOUS
  Filled 2021-03-31: qty 500

## 2021-03-31 MED ORDER — LACTATED RINGERS IV SOLN: 500.0000 mL | INTRAVENOUS | Status: DC | PRN

## 2021-03-31 NOTE — Progress Notes (Addendum)
Labor Progress Note Tonya Hart is a 30 y.o. G1P0000 at [redacted]w[redacted]d presented for IOL for post-dates. S: Feeling contractions more now.  O:  LMP 06/19/2020  EFM: 125/Moderate/ +Accels, -Deccels  CVE: Dilation: 2 Effacement (%): 70 Station: -2 Presentation: Vertex Exam by:: m wilkins rnc   A&P: 30 y.o. G1P0000 [redacted]w[redacted]d presented for IOL for post-dates. #Labor: Contractions improved.  Foley bulb still in place. #Pain: PRN #FWB: Cat 1 #GBS negative   Jovita Kussmaul, MD 3:47 PM

## 2021-03-31 NOTE — Progress Notes (Signed)
Tonya Hart is a 30 y.o. G1P0000 at [redacted]w[redacted]d admitted for IOL for postdates.  Subjective: In to introduce self to patient. Tonya Hart is doing well. She is resting comfortably with her epidural. Her fiancee, Tonya Hart, is present at bedside. She has no concerns or questions at this time.   Objective: BP (!) 137/92   Pulse 86   Temp 98.3 F (36.8 C) (Oral)   Resp 18   LMP 06/19/2020  No intake/output data recorded. Total I/O In: -  Out: 100 [Urine:100]  FHT: 125 bpm, moderate variability, +accels, no decels UC: irregular SVE:   Dilation: 5 Effacement (%): 50 Station: -2 Exam by:: [employer]el RN  Labs: Lab Results  Component Value Date   WBC 10.2 03/31/2021   HGB 13.1 03/31/2021   HCT 38.0 03/31/2021   MCV 93.4 03/31/2021   PLT 195 03/31/2021    Assessment / Plan: Tonya Hart 30 y.o. G1P0 at [redacted]w[redacted]d here for IOL for postdates  Labor: Given no cervical change, we discussed starting Pitocin now that she has an epidural and no longer candidate for laboring in tub. Patient agrees and consents. Pitocin started 2/2. Preeclampsia: BP's mild range. Patient is asymptomatic. Labs pending. Will continue to monitor.  Fetal Wellbeing: Category 1 Pain Control: Epidural I/D: GBS negative Anticipated MOD: Anticipate SVD   Brand Males, CNM 03/31/2021, 8:50 PM

## 2021-03-31 NOTE — Anesthesia Procedure Notes (Signed)
Epidural Patient location during procedure: OB Start time: 03/31/2021 6:36 PM End time: 03/31/2021 6:40 PM  Staffing Anesthesiologist: Bethena Midget, MD  Preanesthetic Checklist Completed: patient identified, IV checked, site marked, risks and benefits discussed, surgical consent, monitors and equipment checked, pre-op evaluation and timeout performed  Epidural Patient position: sitting Prep: DuraPrep and site prepped and draped Patient monitoring: continuous pulse ox and blood pressure Approach: midline Location: L3-L4 Injection technique: LOR air  Needle:  Needle type: Tuohy  Needle gauge: 17 G Needle length: 9 cm and 9 Needle insertion depth: 8 cm Catheter type: closed end flexible Catheter size: 19 Gauge Catheter at skin depth: 13 cm Test dose: negative  Assessment Events: blood not aspirated, injection not painful, no injection resistance, no paresthesia and negative IV test

## 2021-03-31 NOTE — Progress Notes (Signed)
Labor Progress Note Tonya Hart is a 30 y.o. G1P0000 at [redacted]w[redacted]d presented for IOL for PD  S:  Feeling more ctx. FB recently fell out.   O:  LMP 06/19/2020  EFM: baseline 130 bpm/ mod variability/ + accels/ no decels  Toco/IUPC: 2-4 SVE: Dilation: 5 Effacement (%): 80 Station: -2 Presentation: Vertex Exam by:: Zylen Wenig   A/P: 30 y.o. G1P0000 [redacted]w[redacted]d  1. Labor: latent 2. FWB: Cat I 3. Pain: water immersion   Discussed options for IOL: membrane sweep vs Cytotec vs Pitocin. Prefers membrane sweep, performed and tolerated well. Anticipate labor progress and SVD.  Donette Larry, CNM 4:25 PM

## 2021-03-31 NOTE — H&P (Addendum)
OBSTETRIC ADMISSION HISTORY AND PHYSICAL  Tonya Hart is a 30 y.o. female G1P0000 with IUP at [redacted]w[redacted]d by LMP Confirmed on 19 wk [place] presenting for IOL for post-dates. She reports +FMs, No LOF, no VB, no blurry vision, headaches or peripheral edema, and RUQ pain.  She plans on breast feeding. She is considering POP's for birth control. She is planning water immersion for labor only.  She received her prenatal care at [facility]   Dating: By LMP Confirmed on 19 wk [place]  --->  Estimated Date of Delivery: 03/26/21  Sono:    @[redacted]w[redacted]d , CWD, normal anatomy, Cephalic presentation, Anterior Right lie, 722g, 45% EFW  Prenatal History/Complications:  Hx of Tobacco Use (stopped defore pregnancy) Hx of Anxiety/Depression PCOS Endometriosis  Past Medical History: Past Medical History:  Diagnosis Date  . Anxiety and depression    no current medications  . Asthma   . Endometriosis   . Environmental and seasonal allergies   . PCOS (polycystic ovarian syndrome)   . Seasonal allergies     Past Surgical History: Past Surgical History:  Procedure Laterality Date  . TONSILLECTOMY    . TONSILLECTOMY AND ADENOIDECTOMY      Obstetrical History: OB History    Gravida  1   Para  0   Term  0   Preterm  0   AB  0   Living  0     SAB  0   IAB  0   Ectopic  0   Multiple  0   Live Births  0           Social History Social History   Socioeconomic History  . Marital status: Single    Spouse name: Not on file  . Number of children: Not on file  . Years of education: Not on file  . Highest education level: Not on file  Occupational History  . Occupation: ISS    Employer: [employer]  Tobacco Use  . Smoking status: Former Smoker    Types: Cigarettes  . Smokeless tobacco: Never Used  Vaping Use  . Vaping Use: Former  . Quit date: 08/04/2020  . Substances: Nicotine  Substance and Sexual Activity  . Alcohol use: Not Currently    Comment: 3 drinks/week, beer or  liquor  . Drug use: Yes    Frequency: 7.0 times per week    Types: Marijuana  . Sexual activity: Yes    Partners: Male    Birth control/protection: None  Other Topics Concern  . Not on file  Social History Narrative  . Not on file   Social Determinants of Health   Financial Resource Strain: Not on file  Food Insecurity: Not on file  Transportation Needs: Not on file  Physical Activity: Not on file  Stress: Not on file  Social Connections: Not on file    Family History: Family History  Problem Relation Age of Onset  . Thyroid disease Mother   . Hyperlipidemia Mother   . Hypertension Mother   . Cataracts Mother   . Hyperlipidemia Father   . COPD Father   . Heart attack Father   . Hypertension Father   . Diabetes Father   . Prostate cancer Maternal Grandfather     Allergies: Allergies  Allergen Reactions  . Chicken Meat (Diagnostic) Anaphylaxis  . Bee Venom Hives    swelling  . Latex Hives  . Soap Hives    Dove soap    Medications Prior to Admission  Medication  Sig Dispense Refill Last Dose  . busPIRone (BUSPAR) 10 MG tablet Take 1 tablet (10 mg total) by mouth 3 (three) times daily. 30 tablet 2   . cyclobenzaprine (FLEXERIL) 5 MG tablet Take 1-2 tablets (5-10 mg total) by mouth 3 (three) times daily as needed for muscle spasms. 30 tablet 0   . Doxylamine-Pyridoxine (DICLEGIS) 10-10 MG TBEC Take 2 tabs at bedtime. If needed, add another tab in the morning. If needed, add another tab in the afternoon, up to 4 tabs/day. (Patient not taking: No sig reported) 100 tablet 6   . EPINEPHrine 0.3 mg/0.3 mL IJ SOAJ injection      . escitalopram (LEXAPRO) 20 MG tablet Take 20 mg by mouth daily.     . Ferrous Gluconate (IRON 27 PO) Take by mouth.     . hydrOXYzine (VISTARIL) 25 MG capsule Take 1-2 capsules (25-50 mg total) by mouth at bedtime as needed for anxiety. (Patient not taking: No sig reported) 30 capsule 1   . Prenatal Vit-Fe Fumarate-FA (MULTIVITAMIN-PRENATAL)  27-0.8 MG TABS tablet Take 1 tablet by mouth daily at 12 noon.      Review of Systems   All systems reviewed and negative except as stated in HPI  Last menstrual period 06/19/2020. General appearance: alert and cooperative Lungs: Breathing comfortably Heart: regular rate and rhythm Presentation: cephalic Fetal monitoringBaseline: 125 bpm, Variability: Good {> 6 bpm), Accelerations: Reactive and Decelerations: Absent Uterine activity Frequency: Every 3-4 minutes and Intensity: mild Dilation: 2 Effacement (%): 70 Station: -2 Exam by:: m wilkins rnc  Prenatal labs: ABO, Rh: --/--/PENDING (05/12 1053) Antibody: PENDING (05/12 1053) Rubella: 1.44 (11/16 1523) RPR: NON-REACTIVE (02/15 0000)  HBsAg: NON-REACTIVE (11/16 1523)  HIV: NON-REACTIVE (02/15 0000)  GBS:   Neagtive 2 hr Glucola Negative Genetic screening  Normal Anatomy US Normal  Prenatal Transfer Tool  Maternal Diabetes: No Genetic Screening: Normal Maternal Ultrasounds/Referrals: Normal Fetal Ultrasounds or other Referrals:  None Maternal Substance Abuse:  No Significant Maternal Medications:  None Significant Maternal Lab Results: Group B Strep negative  Results for orders placed or performed during the hospital encounter of 03/31/21 (from the past 24 hour(s))  CBC   Collection Time: 03/31/21 10:53 AM  Result Value Ref Range   WBC 10.2 4.0 - 10.5 K/uL   RBC 4.07 3.87 - 5.11 MIL/uL   Hemoglobin 13.1 12.0 - 15.0 g/dL   HCT 16.1 09.6 - 04.5 %   MCV 93.4 80.0 - 100.0 fL   MCH 32.2 26.0 - 34.0 pg   MCHC 34.5 30.0 - 36.0 g/dL   RDW 40.9 81.1 - 91.4 %   Platelets 195 150 - 400 K/uL   nRBC 0.0 0.0 - 0.2 %  Type and screen   Collection Time: 03/31/21 10:53 AM  Result Value Ref Range   ABO/RH(D) PENDING    Antibody Screen PENDING    Sample Expiration      04/03/2021,2359 Performed at [facility], [address] [place], [place] [address]     Patient Active Problem List   Diagnosis Date Noted  .  Post term pregnancy over 40 weeks 03/31/2021  . Supervision of normal first pregnancy 09/01/2020  . Positive pregnancy test 08/04/2020  . Dysmenorrhea 05/18/2020  . Depression with anxiety 04/04/2020  . Anxiety state 04/04/2020  . Cigarette nicotine dependence without complication 01/04/2018    Assessment/Plan:  Syrai Gladwin is a 30 y.o. G1P0000 at [redacted]w[redacted]d here for presenting for IOL for post-dates with Tub labor but not birth.  #  Induction:  Plan to place foley balloon.  Will consider Cytotec. #Pain: PRN, considering Epidural. #FWB: Cat 1 #ID: GBS Negative #MOF: Breast #MOC: Unsure, considering POP's #Circ: N/A #Hx of Depression/Anxiety:  Previously on medication, stopped during pregnancy. #PCOS:  More in depth discussion of birth control/disease management/breast feeding and benefits.  Jovita Kussmaul, MD  03/31/2021, 12:06 PM  Midwife attestation: I have seen and examined this patient; I agree with above documentation in the resident's note.   PE: Gen: calm comfortable, NAD Resp: normal effort and rate Abd: gravid  ROS, labs, PMH reviewed  Assessment/Plan: 40.[redacted] weeks gestation Labor: latent FWB: Cat I GBS: neg Admit to LD FB for ripening Anticipate SVD  Donette Larry, CNM  03/31/2021, 12:22 PM

## 2021-03-31 NOTE — Anesthesia Preprocedure Evaluation (Signed)
Anesthesia Evaluation  Patient identified by MRN, date of birth, ID band Patient awake    Reviewed: Allergy & Precautions, H&P , NPO status , Patient's Chart, lab work & pertinent test results, reviewed documented beta blocker date and time   Airway Mallampati: I  TM Distance: >3 FB Neck ROM: full    Dental no notable dental hx. (+) Teeth Intact, Dental Advisory Given   Pulmonary neg pulmonary ROS, former smoker,    Pulmonary exam normal breath sounds clear to auscultation       Cardiovascular negative cardio ROS Normal cardiovascular exam Rhythm:regular Rate:Normal     Neuro/Psych PSYCHIATRIC DISORDERS Anxiety Depression negative neurological ROS     GI/Hepatic negative GI ROS, Neg liver ROS,   Endo/Other  negative endocrine ROS  Renal/GU negative Renal ROS  negative genitourinary   Musculoskeletal   Abdominal   Peds  Hematology negative hematology ROS (+)   Anesthesia Other Findings   Reproductive/Obstetrics (+) Pregnancy                             Anesthesia Physical Anesthesia Plan  ASA: II  Anesthesia Plan: Epidural   Post-op Pain Management:    Induction:   PONV Risk Score and Plan: 2  Airway Management Planned: Natural Airway  Additional Equipment: None  Intra-op Plan:   Post-operative Plan:   Informed Consent: I have reviewed the patients History and Physical, chart, labs and discussed the procedure including the risks, benefits and alternatives for the proposed anesthesia with the patient or authorized representative who has indicated his/her understanding and acceptance.       Plan Discussed with: Anesthesiologist and CRNA  Anesthesia Plan Comments:         Anesthesia Quick Evaluation

## 2021-04-01 ENCOUNTER — Encounter (HOSPITAL_COMMUNITY): Payer: Self-pay | Admitting: Obstetrics & Gynecology

## 2021-04-01 DIAGNOSIS — O139 Gestational [pregnancy-induced] hypertension without significant proteinuria, unspecified trimester: Secondary | ICD-10-CM | POA: Diagnosis not present

## 2021-04-01 DIAGNOSIS — O134 Gestational [pregnancy-induced] hypertension without significant proteinuria, complicating childbirth: Secondary | ICD-10-CM

## 2021-04-01 DIAGNOSIS — O480 Post-term pregnancy: Secondary | ICD-10-CM

## 2021-04-01 DIAGNOSIS — Z3A40 40 weeks gestation of pregnancy: Secondary | ICD-10-CM

## 2021-04-01 LAB — CBC
HCT: 35.8 % — ABNORMAL LOW (ref 36.0–46.0)
Hemoglobin: 12.2 g/dL (ref 12.0–15.0)
MCH: 32.4 pg (ref 26.0–34.0)
MCHC: 34.1 g/dL (ref 30.0–36.0)
MCV: 95 fL (ref 80.0–100.0)
Platelets: 159 10*3/uL (ref 150–400)
RBC: 3.77 MIL/uL — ABNORMAL LOW (ref 3.87–5.11)
RDW: 13.4 % (ref 11.5–15.5)
WBC: 15.1 10*3/uL — ABNORMAL HIGH (ref 4.0–10.5)
nRBC: 0 % (ref 0.0–0.2)

## 2021-04-01 LAB — RPR: RPR Ser Ql: NONREACTIVE

## 2021-04-01 MED ORDER — BENZOCAINE-MENTHOL 20-0.5 % EX AERO
1.0000 "application " | INHALATION_SPRAY | CUTANEOUS | Status: DC | PRN
  Filled 2021-04-01: qty 56

## 2021-04-01 MED ORDER — TETANUS-DIPHTH-ACELL PERTUSSIS 5-2.5-18.5 LF-MCG/0.5 IM SUSY: 0.5000 mL | PREFILLED_SYRINGE | Freq: Once | INTRAMUSCULAR | Status: DC

## 2021-04-01 MED ORDER — COCONUT OIL OIL
1.0000 "application " | TOPICAL_OIL | Status: DC | PRN
  Administered 2021-04-02: 1 via TOPICAL

## 2021-04-01 MED ORDER — ONDANSETRON HCL 4 MG PO TABS: 4.0000 mg | ORAL_TABLET | ORAL | Status: DC | PRN

## 2021-04-01 MED ORDER — ZOLPIDEM TARTRATE 5 MG PO TABS: 5.0000 mg | ORAL_TABLET | Freq: Every evening | ORAL | Status: DC | PRN

## 2021-04-01 MED ORDER — ACETAMINOPHEN 325 MG PO TABS
650.0000 mg | ORAL_TABLET | ORAL | Status: DC | PRN
  Administered 2021-04-01 – 2021-04-02 (×4): 650 mg via ORAL
  Filled 2021-04-01 (×4): qty 2

## 2021-04-01 MED ORDER — ONDANSETRON HCL 4 MG/2ML IJ SOLN
4.0000 mg | INTRAMUSCULAR | Status: DC | PRN
Start: 2021-04-01 — End: 2021-04-03

## 2021-04-01 MED ORDER — SIMETHICONE 80 MG PO CHEW: 80.0000 mg | CHEWABLE_TABLET | ORAL | Status: DC | PRN

## 2021-04-01 MED ORDER — IBUPROFEN 600 MG PO TABS
600.0000 mg | ORAL_TABLET | Freq: Four times a day (QID) | ORAL | Status: DC
  Administered 2021-04-01 – 2021-04-03 (×9): 600 mg via ORAL
  Filled 2021-04-01 (×9): qty 1

## 2021-04-01 MED ORDER — DIPHENHYDRAMINE HCL 25 MG PO CAPS: 25.0000 mg | ORAL_CAPSULE | Freq: Four times a day (QID) | ORAL | Status: DC | PRN

## 2021-04-01 MED ORDER — WITCH HAZEL-GLYCERIN EX PADS: 1.0000 "application " | MEDICATED_PAD | CUTANEOUS | Status: DC | PRN

## 2021-04-01 MED ORDER — DIBUCAINE (PERIANAL) 1 % EX OINT: 1.0000 "application " | TOPICAL_OINTMENT | CUTANEOUS | Status: DC | PRN

## 2021-04-01 MED ORDER — SENNOSIDES-DOCUSATE SODIUM 8.6-50 MG PO TABS
2.0000 | ORAL_TABLET | Freq: Every day | ORAL | Status: DC
  Administered 2021-04-02 – 2021-04-03 (×2): 2 via ORAL
  Filled 2021-04-01 (×2): qty 2

## 2021-04-01 MED ORDER — PRENATAL MULTIVITAMIN CH
1.0000 | ORAL_TABLET | Freq: Every day | ORAL | Status: DC
  Administered 2021-04-01 – 2021-04-03 (×3): 1 via ORAL
  Filled 2021-04-01 (×3): qty 1

## 2021-04-01 NOTE — Anesthesia Postprocedure Evaluation (Signed)
Anesthesia Post Note  Patient: Tonya Hart  Procedure(s) Performed: AN AD HOC LABOR EPIDURAL     Patient location during evaluation: Mother Baby Anesthesia Type: Epidural Level of consciousness: awake Pain management: satisfactory to patient Vital Signs Assessment: post-procedure vital signs reviewed and stable Respiratory status: spontaneous breathing Cardiovascular status: stable Anesthetic complications: no   No complications documented.  Last Vitals:  Vitals:   04/01/21 0636 04/01/21 0815  BP:  138/81  Pulse:  86  Resp:  17  Temp: 36.9 C 36.6 C  SpO2:      Last Pain:  Vitals:   04/01/21 0815  TempSrc: Oral  PainSc: 0-No pain   Pain Goal:                   [employer]

## 2021-04-01 NOTE — Progress Notes (Signed)
Tonya Hart is a 30 y.o. G1P0000 at [redacted]w[redacted]d admitted for IOL for postdates.  Subjective: Tonya Hart is doing well. She is reporting leaking fluid and feeling as if she needs to have a bowel movement.   Objective: BP (!) 127/91   Pulse 88   Temp 98.4 F (36.9 C) (Oral)   Resp 16   LMP 06/19/2020  No intake/output data recorded. Total I/O In: -  Out: 900 [Urine:900]  FHT: 125 bpm, moderate variability, +accels, no decels UC: Q2-3 mins SVE:   Dilation: 6 Effacement (%): 90 Station: -1 Exam by:: Camelia Eng CNM  Labs: Lab Results  Component Value Date   WBC 10.2 03/31/2021   HGB 13.1 03/31/2021   HCT 38.0 03/31/2021   MCV 93.4 03/31/2021   PLT 195 03/31/2021    Assessment / Plan: Tonya Hart 30 y.o. G1P0 at [redacted]w[redacted]d here for IOL for postdates  Labor: Progressing well on Pitocin. SROM at 0150, clear fluid. Preeclampsia: Labs normal, asymptomatic. Patient now meets criteria for gHTN. Fetal Wellbeing: Category 1 Pain Control: Epidural I/D: GBS negative Anticipated MOD: Anticipate SVD   Brand Males, CNM 04/01/2021, 2:00 AM

## 2021-04-01 NOTE — Lactation Note (Signed)
This note was copied from a baby's chart. Lactation Consultation Note Baby less than 1 hr old at time of seeing the baby. Mom has flat nipples. Baby is off and on constantly.  LC held breast in t-cup position for baby to latch. Lc wonders if baby can latch w/o holding nipple in baby. BF in cradle and football. Will f/u on MBU.Marland Kitchen  Patient Name: Tonya Hart TJQZE'[id number] Date: 04/01/2021 Reason for consult: L&D Initial assessment;Term;Primapara;Maternal endocrine disorder Age:57 hours  Maternal Data Does the patient have breastfeeding experience prior to this delivery?: No  Feeding    LATCH Score Latch: Grasps breast easily, tongue down, lips flanged, rhythmical sucking.  Audible Swallowing: None  Type of Nipple: Flat  Comfort (Breast/Nipple): Soft / non-tender  Hold (Positioning): Assistance needed to correctly position infant at breast and maintain latch.  LATCH Score: 6   Lactation Tools Discussed/Used    Interventions Interventions: Breast feeding basics reviewed;Adjust position  Discharge    Consult Status Consult Status: Follow-up Date: 04/01/21 Follow-up type: In-patient    Charyl Dancer 04/01/2021, 5:45 AM

## 2021-04-01 NOTE — Discharge Summary (Signed)
Postpartum Discharge Summary  Date of Service updated 04/03/21     Patient Name: Tonya Hart DOB: 01-Mar-1991 MRN: [id number]  Date of admission: 03/31/2021 Delivery date:04/01/2021  Delivering provider: Renee Harder  Date of discharge:   Admitting diagnosis: Post term pregnancy over 40 weeks [O48.0] Intrauterine pregnancy: [redacted]w[redacted]d    Secondary diagnosis:  Active Problems:   Cigarette nicotine dependence without complication   Depression with anxiety   Anxiety state   Supervision of normal first pregnancy   Post term pregnancy over [redacted] weeks   Gestational hypertension   SVD (spontaneous vaginal delivery)  Additional problems: none    Discharge diagnosis: Term Pregnancy Delivered and Gestational Hypertension                                              Post partum procedures:none Augmentation: Pitocin and IP Foley Complications: None  Hospital course: Induction of Labor With Vaginal Delivery   30y.o. yo G1P0000 at 479w6das admitted to the hospital 03/31/2021 for induction of labor.  Indication for induction: Postdates.  Patient had an uncomplicated labor course as follows: Membrane Rupture Time/Date: 1:50 AM ,04/01/2021   Delivery Method:Vaginal, Spontaneous  Episiotomy: None  Lacerations:  1st degree;Labial  Details of delivery can be found in separate delivery note.  Patient had a routine postpartum course. Patient is discharged home 04/03/21.  Newborn Data: Birth date:04/01/2021  Birth time:4:40 AM  Gender:Female  Living status:Living  Apgars:8 ,9  Weight:3680 g   Magnesium Sulfate received: No BMZ received: No Rhophylac:N/A MMR:N/A T-DaP:Given prenatally Flu: No Transfusion:No  Physical exam  Vitals:   04/02/21 0502 04/02/21 1536 04/02/21 2025 04/03/21 0500  BP: 124/88 (!) 138/99 (!) 141/98 128/87  Pulse: 82 91 89 89  Resp: _0 Temp: 97.9 F (36.6 C) 98.3 F (36.8 C) 97.8 F (36.6 C) 98 F (36.7 C)  TempSrc: Oral Oral Oral Oral   SpO2: 99% 99% 99% 98%   General: alert, cooperative and no distress Lochia: appropriate Uterine Fundus: firm Incision: N/A DVT Evaluation: No evidence of DVT seen on physical exam. Labs: Lab Results  Component Value Date   WBC 15.1 (H) 04/01/2021   HGB 12.2 04/01/2021   HCT 35.8 (L) 04/01/2021   MCV 95.0 04/01/2021   PLT 159 04/01/2021   CMP Latest Ref Rng & Units 03/31/2021  Glucose 70 - 99 mg/dL 78  BUN 6 - 20 mg/dL 6  Creatinine 0.44 - 1.00 mg/dL 0.61  Sodium 135 - 145 mmol/L 136  Potassium 3.5 - 5.1 mmol/L 4.0  Chloride 98 - 111 mmol/L 105  CO2 22 - 32 mmol/L 23  Calcium 8.9 - 10.3 mg/dL 9.1  Total Protein 6.5 - 8.1 g/dL 5.6(L)  Total Bilirubin 0.3 - 1.2 mg/dL 0.7  Alkaline Phos 38 - 126 U/L 189(H)  AST 15 - 41 U/L 14(L)  ALT 0 - 44 U/L 12   Edinburgh Score: Edinburgh Postnatal Depression Scale Screening Tool 04/02/2021  I have been able to laugh and see the funny side of things. 0  I have looked forward with enjoyment to things. 0  I have blamed myself unnecessarily when things went wrong. 2  I have been anxious or worried for no good reason. 2  I have felt scared or panicky for no good reason. 2  Things have been getting on top  of me. 1  I have been so unhappy that I have had difficulty sleeping. 0  I have felt sad or miserable. 0  I have been so unhappy that I have been crying. 0  The thought of harming myself has occurred to me. 0  Edinburgh Postnatal Depression Scale Total 7     After visit meds:  Allergies as of 04/03/2021      Reactions   Chicken Meat (diagnostic) Anaphylaxis   Bee Venom Hives   swelling   Latex Hives   Soap Hives   Dove soap      Medication List    STOP taking these medications   cyclobenzaprine 5 MG tablet Commonly known as: FLEXERIL   IRON 27 PO     TAKE these medications   acetaminophen 500 MG tablet Commonly known as: TYLENOL Take 2 tablets (1,000 mg total) by mouth every 6 (six) hours as needed (for pain scale <  4).   busPIRone 10 MG tablet Commonly known as: BUSPAR Take 1 tablet (10 mg total) by mouth 3 (three) times daily.   coconut oil Oil Apply 1 application topically as needed.   Doxylamine-Pyridoxine 10-10 MG Tbec Commonly known as: Diclegis Take 2 tabs at bedtime. If needed, add another tab in the morning. If needed, add another tab in the afternoon, up to 4 tabs/day.   EPINEPHrine 0.3 mg/0.3 mL Soaj injection Commonly known as: EPI-PEN   escitalopram 20 MG tablet Commonly known as: LEXAPRO Take 20 mg by mouth daily.   hydrOXYzine 25 MG capsule Commonly known as: Vistaril Take 1-2 capsules (25-50 mg total) by mouth at bedtime as needed for anxiety.   ibuprofen 600 MG tablet Commonly known as: ADVIL Take 1 tablet (600 mg total) by mouth every 6 (six) hours as needed.   multivitamin-prenatal 27-0.8 MG Tabs tablet Take 1 tablet by mouth daily at 12 noon.   NIFEdipine 30 MG 24 hr tablet Commonly known as: ADALAT CC Take 1 tablet (30 mg total) by mouth daily.   norethindrone 0.35 MG tablet Commonly known as: Ortho Micronor Take 1 tablet (0.35 mg total) by mouth daily.       Discharge home in stable condition Infant Feeding: Breast Infant Disposition:home with mother Discharge instruction: per After Visit Summary and Postpartum booklet. Activity: Advance as tolerated. Pelvic rest for 6 weeks.  Diet: routine diet Future Appointments:No future appointments. Follow up Visit:   Please schedule this patient for a Virtual postpartum visit in 6 weeks with the following provider: Any provider. Additional Postpartum F/U:Postpartum Depression checkup and BP check 1 week  Low risk pregnancy complicated by: HTN Delivery mode:  Vaginal, Spontaneous  Anticipated Birth Control:  POPs, rx sent to pharmacy at discharge.   Postpartum BP/mood check in 1 week and PP visit in 6 weeks. Message sent to [facility] to be scheduled by Maryagnes Amos, CNM.     7/67/3419 Arrie Senate, MD

## 2021-04-01 NOTE — Social Work (Signed)
MOB was referred for history of depression and anxiety.   * Referral screened out by Clinical Social Worker because none of the following criteria appear to apply:  ~ History of anxiety/depression during this pregnancy, or of post-partum depression following prior delivery. ~ Diagnosis of anxiety and/or depression within last 3 years. OR * MOB's symptoms currently being treated with medication and/or therapy. CSW reviewed chart and notes MOB restarted Buspar 10mg and Lexapro 20mg on 11/09/20 at 20w.   Please contact the Clinical Social Worker if needs arise, by MOB request, or if MOB scores greater than 9/yes to question 10 on Edinburgh Postpartum Depression Screen.  Darlyne Schmiesing, LCSWA Clinical Social Work [facility]  [phone] 

## 2021-04-02 MED ORDER — PNEUMOCOCCAL VAC POLYVALENT 25 MCG/0.5ML IJ INJ
0.5000 mL | INJECTION | Freq: Once | INTRAMUSCULAR | Status: AC
  Administered 2021-04-02: 0.5 mL via INTRAMUSCULAR
  Filled 2021-04-02: qty 0.5

## 2021-04-02 MED ORDER — NIFEDIPINE ER OSMOTIC RELEASE 30 MG PO TB24
30.0000 mg | ORAL_TABLET | Freq: Every day | ORAL | Status: DC
  Administered 2021-04-02 – 2021-04-03 (×2): 30 mg via ORAL
  Filled 2021-04-02 (×2): qty 1

## 2021-04-02 NOTE — Lactation Note (Signed)
This note was copied from a baby's chart. Lactation Consultation Note  Patient Name: Tonya Hart SWNIO'[id number] Date: 04/02/2021 Reason for consult: Initial assessment;1st time breastfeeding;Term Age:28 hours P1, term female infant. Infant had 2 voids and 4 stools since birth. Per mom, she feels infant has a shallow latch, LC entered the room, mom had infant latched away from the  breast and infant was only on the tip of mom's nipple. Mom open to changing latch position, mom re-latched infant on her left breast using the football hold position, infant latched with depth, per mom, she only feels a tug and big difference with the latch, infant breastfeed for total 35 minutes with this feeding. Mom knows to call RN or [facility] services if she need further assistance with latching infant at the breast. Mom knows to breastfeed infant according to cues, 8 to 12 or more times within 24 hours, STS. LC discussed infant's input and output with mom. Mom doesn't have pump at home. Mom made aware of O/P services, breastfeeding support groups, community resources, and our phone # for post-discharge questions.  Maternal Data Has patient been taught Hand Expression?: Yes  Feeding Mother's Current Feeding Choice: Breast Milk  LATCH Score Latch: Grasps breast easily, tongue down, lips flanged, rhythmical sucking.  Audible Swallowing: A few with stimulation  Type of Nipple: Everted at rest and after stimulation  Comfort (Breast/Nipple): Soft / non-tender  Hold (Positioning): Assistance needed to correctly position infant at breast and maintain latch.  LATCH Score: 8   Lactation Tools Discussed/Used    Interventions Interventions: Breast feeding basics reviewed;Assisted with latch;Skin to skin;Breast massage;Breast compression;Adjust position;Support pillows;Position options;Expressed milk;Education  Discharge WIC Program: No  Consult Status Consult Status: Follow-up Date: 04/02/21 Follow-up  type: In-patient    Danelle Earthly 04/02/2021, 12:30 AM

## 2021-04-02 NOTE — Clinical Social Work Maternal (Signed)
CLINICAL SOCIAL WORK MATERNAL/CHILD NOTE  Patient Details  Name: Kelby Aline MRN: [id number] Date of Birth: 12/19/1990  Date:  2021-05-05  Clinical Social Worker Initiating Note:  Darcus Austin, MSW, LCSWA Date/Time: Initiated:  04/02/21/1457     Child's Name:  Sarina Ill Premo   Biological Parents:  Mother,Father Dorothea Ogle [place], 05/27/92, [phone])   Need for Interpreter:  None   Reason for Referral:  Current Substance Use/Substance Use During Pregnancy    Address:  [address] [place] [address]    Phone number:  [phone] (home) [phone] (work)    Additional phone number:   Household Members/Support Persons (HM/SP):   Household Member/Support Person 1   HM/SP Name Relationship DOB or Age  HM/SP -1 Dorothea Ogle Premo FOB 05/27/92  HM/SP -2        HM/SP -3        HM/SP -4        HM/SP -5        HM/SP -6        HM/SP -7        HM/SP -8          Natural Supports (not living in the home):  Spouse/significant other   Professional Supports:     Employment: Unemployed   Type of Work:     Education:  Other (comment) (Associate Degree)   Homebound arranged:    Financial Resources:  Medicaid   Other Resources:  Physicist, medical    Cultural/Religious Considerations Which May Impact Care:    Strengths:  Ability to meet basic needs ,Pediatrician chosen,Home prepared for child    Psychotropic Medications:         Pediatrician:    [place] (including [place])  Pediatrician List:   [place] Other ([facility])    Pediatrician Fax Number:    Risk Factors/Current Problems:  Substance Use    Cognitive State:  Able to Concentrate ,Alert ,Insightful ,Linear Thinking    Mood/Affect:  Happy ,Calm ,Relaxed ,Comfortable ,Interested ,Bright ,Tearful    CSW Assessment: CSW met with MOB to complete assessment for substance  use during pregnancy. CSW observed MOB resting while holding infant. CSW explained role and reason for consult. MOB was pleasant, polite and engaged with CSW. MOB reported, her reason for THC use during pregnancy was, because she was nausea and could not keep anything down. MOB reported, her last THC use was in November 2021. MOB reported, during her pregnancy she was around friends who used THC and that may be the reason for infant's positive UDS. MOB denied any other substance or CPS involvement.    CSW informed MOB of Drug Screen Policy and MOB was understanding of protocol.CSW informed MOB of infant's positive THC UDS. CSW made [place] report via [employer]". Per CPS Social Worker "[place]", please do not d/c MOB until CPS follow up. CSW will continue to follow the CDS and will notify CPS of any additional information if warranted.   CSW provided education regarding the baby blues period vs. perinatal mood disorders, discussed treatment and gave resources for mental health follow up if concerns arise. CSW recommends self- evaluation during the postpartum time period using the New Mom Checklist from Postpartum Progress and encouraged MOB to contact a medical professional if symptoms are noted at any time.   When CSW asked MOB  about her emotions after delivery. MOB reported, she is feeling a lot better than she thought she would. MOB identified, FOB as her support. MOB denied SI, HI and DV when CSW assessed for safety.   MOB reported, she receive FS. CSW informed MOB about adding infant to WIC and FS. MOB reported, infant's pediatrician will be at [facility] and there are no barriers to follow up care. MOB reported, she has all essentials needed to care for infant. MOB reported, infant has a new car seat, crib, and bassinet. MOB denied any additional barriers.     CSW provided education on Sudden Infant Death Syndrome (SIDS).   Per CPS Social  Worker "[place]", please do not d/c MOB until CPS follow up.   CSW will continue to follow the CDS and will notify CPS of any additional information if warranted.  CSW Plan/Description:  Perinatal Mood and Anxiety Disorder (PMADs) Education,Sudden Infant Death Syndrome (SIDS) Education,CSW Will Continue to Monitor Umbilical Cord Tissue Drug Screen Results and Make Report if Warranted,Child Protective Service Report ,Hospital Drug Screen Policy Information,No Further Intervention Required/No Barriers to Discharge    Mischa Brittingham, LCSWA 04/02/2021, 3:07 PM 

## 2021-04-02 NOTE — Progress Notes (Signed)
Post Partum Day 1 Subjective: Tonya Hart  is a 30 y.o. G1P1001 s/p SVD at [redacted]w[redacted]d.  She reports she is doing well. No acute events overnight. She denies any problems with ambulating, voiding or po intake. Denies nausea or vomiting.  Pain is well controlled on ibuprofen.  Lochia is moderate and improving. Attempting breastfeeding with some tips from [facility].  Objective: Blood pressure 124/88, pulse 82, temperature 97.9 F (36.6 C), temperature source Oral, resp. rate 16, last menstrual period 06/19/2020, SpO2 99 %, currently breastfeeding.  Physical Exam:  General: alert, cooperative, fatigued and no distress Lochia: appropriate Uterine Fundus: firm, U-1 Incision: n/a DVT Evaluation: No evidence of DVT seen on physical exam. Negative Homan's sign. No cords or calf tenderness. No significant calf/ankle edema.  Recent Labs    03/31/21 1053 04/01/21 0609  HGB 13.1 12.2  HCT 38.0 35.8*    Assessment/Plan: Plan for discharge tomorrow, Breastfeeding, Lactation consult and Contraception PoPs  Postpartum Hypertension - start Procardia XL 30 mg qd   LOS: 2 days   Raelyn Mora, CNM 04/02/2021, 9:18 AM

## 2021-04-02 NOTE — Lactation Note (Signed)
This note was copied from a baby's chart. Lactation Consultation Note  Patient Name: Ebbie Sorenson EHOZY'Y Date: 04/02/2021 Reason for consult: 1st time breastfeeding;Infant weight loss (Infant with -5% weight loss.) Age:28 hours P1,term female infant, -5% weight loss. Infant had 9 stools and 6 voids. Per mom, she feels breastfeeding is going well no concerns for LC tonight and infant is currently cluster feeding. LC entered the room, mom had infant latched on her right breast using the cross cradle hold position, infant latched with depth and LC did not assist with latch. Mom will continue to breastfeed infant according to cues, 8 to 12 or more times within 24 hours, STS. Mom knows she can hand express after latching infant at breast and give infant extra volume by spoon.   Maternal Data    Feeding Mother's Current Feeding Choice: Breast Milk  LATCH Score Latch: Grasps breast easily, tongue down, lips flanged, rhythmical sucking.  Audible Swallowing: Spontaneous and intermittent  Type of Nipple: Everted at rest and after stimulation  Comfort (Breast/Nipple): Soft / non-tender  Hold (Positioning): No assistance needed to correctly position infant at breast.  LATCH Score: 10   Lactation Tools Discussed/Used    Interventions    Discharge    Consult Status Consult Status: Follow-up Date: 04/03/21 Follow-up type: In-patient    Danelle Earthly 04/02/2021, 11:15 PM

## 2021-04-03 MED ORDER — COCONUT OIL OIL: 1.0000 "application " | TOPICAL_OIL | 0 refills | Status: DC | PRN

## 2021-04-03 MED ORDER — NORETHINDRONE 0.35 MG PO TABS: 1.0000 | ORAL_TABLET | Freq: Every day | ORAL | 11 refills | Status: DC

## 2021-04-03 MED ORDER — NIFEDIPINE ER 30 MG PO TB24: 30.0000 mg | ORAL_TABLET | Freq: Every day | ORAL | 3 refills | Status: DC

## 2021-04-03 MED ORDER — ACETAMINOPHEN 500 MG PO TABS: 1000.0000 mg | ORAL_TABLET | Freq: Four times a day (QID) | ORAL | Status: DC | PRN

## 2021-04-03 MED ORDER — IBUPROFEN 600 MG PO TABS: 600.0000 mg | ORAL_TABLET | Freq: Four times a day (QID) | ORAL | 0 refills | Status: DC | PRN

## 2021-04-03 NOTE — Progress Notes (Signed)
CSW attempt to update Orlena Sheldon, social worker at [facility] CPS of MOB's d/c time. CSW left HIPAA complaint voicemail for CPS social worker.   Dolores Frame, MSW, LCSW-A Clinical Social Worker [phone]

## 2021-04-03 NOTE — Lactation Note (Signed)
This note was copied from a baby's chart. Lactation Consultation Note  Patient Name: Tonya Hart HQION'G Date: 04/03/2021 Reason for consult: Follow-up assessment;Nipple pain/trauma Age:28 hours   Mom and dad feel feedings are going well.  Infant cluster fed last night.  Mom does has scabbing across both nipples.  She feels she wasn't latching as well the first day.  She is using cradle hold when feeding and can hear swallows during feeding.  Her breasts are feeling fuller today per mom.  Infant cueing in room.  Mom latched infant in cradle but infant was only latched to nipple.  Mom does a great job at breaking the latch.  LC assisted with cross cradle so mom could bring infant to the breast when latching and obtain a deeper latch.  [facility] taught dad how to help teacup hold the breast tissue when infant latched in order to get more areola into the mouth.  Infant latched for 5 minutes and swallows were heard with compression.  Infant's body was relaxed after the feeding and infant feel asleep on the breast.    On oral assessment, labial frenulum was thick but lip flanges well.  Infant does not flange upper lip during feeding.    Engorgement prevention reviewed and hand exp. Reviewed. Manual pump provided and parts reviewed as well as cleaning.    Family has OP LC resource and support group info as well as phone line.   Maternal Data    Feeding Mother's Current Feeding Choice: Breast Milk  LATCH Score Latch: Grasps breast easily, tongue down, lips flanged, rhythmical sucking.  Audible Swallowing: A few with stimulation  Type of Nipple: Everted at rest and after stimulation  Comfort (Breast/Nipple): Filling, red/small blisters or bruises, mild/mod discomfort  Hold (Positioning): Assistance needed to correctly position infant at breast and maintain latch.  LATCH Score: 7   Lactation Tools Discussed/Used Tools: Coconut oil  Interventions Interventions: Breast feeding basics  reviewed;Assisted with latch;Breast massage;Support pillows;Hand pump;Breast compression;Skin to skin  Discharge Discharge Education: Engorgement and breast care;Warning signs for feeding baby Pump: Manual  Consult Status Consult Status: Complete Date: 04/03/21 Follow-up type: In-patient    Maryruth Hancock [facility] 04/03/2021, 8:42 AM

## 2021-04-03 NOTE — Progress Notes (Signed)
Per [place] CPS social worker Latoya Mcnair #[phone] stated, MOB can be D/C and CPS will follow up once MOB is home. CSW must notify social worker Latoya Mcnair of MOB's D/C time to follow up.   Manav Pierotti, MSW, LCSW-A Clinical Social Worker [phone] 

## 2021-04-03 NOTE — Discharge Instructions (Signed)
-take tylenol 1000 mg every 6 hours as needed for pain, alternate with ibuprofen 600 mg every 6 hours -drink plenty of water to help with breastfeeding -continue prenatal vitamins while you are breastfeeding -think about birth control options-->bedisider.org is a great website! You can get any form of birth control from the health department for free   Postpartum Care After Vaginal Delivery The following information offers guidance about how to care for yourself from the time you deliver your baby to 6-12 weeks after delivery (postpartum period). If you have problems or questions, contact your health care provider for more specific instructions. Follow these instructions at home: Vaginal bleeding  It is normal to have vaginal bleeding (lochia) after delivery. Wear a sanitary pad for bleeding and discharge. ? During the first week after delivery, the amount and appearance of lochia is often similar to a menstrual period. ? Over the next few weeks, it will gradually decrease to a dry, yellow-brown discharge. ? For most women, lochia stops completely by 4-6 weeks after delivery, but can vary.  Change your sanitary pads frequently. Watch for any changes in your flow, such as: ? A sudden increase in volume. ? A change in color. ? Large blood clots.  If you pass a blood clot from your vagina, save it and call your health care provider. Do not flush blood clots down the toilet before talking with your health care provider.  Do not use tampons or douches until your health care provider approves.  If you are not breastfeeding, your period should return 6-8 weeks after delivery. If you are feeding your baby breast milk only, your period may not return until you stop breastfeeding. Perineal care  Keep the area between the vagina and the anus (perineum) clean and dry. Use medicated pads and pain-relieving sprays and creams as directed.  If you had a surgical cut in the perineum (episiotomy) or a  tear, check the area for signs of infection until you are healed. Check for: ? More redness, swelling, or pain. ? Fluid or blood coming from the cut or tear. ? Warmth. ? Pus or a bad smell.  You may be given a squirt bottle to use instead of wiping to clean the perineum area after you use the bathroom. Pat the area gently to dry it.  To relieve pain caused by an episiotomy, a tear, or swollen veins in the anus (hemorrhoids), take a warm sitz bath 2-3 times a day. In a sitz bath, the warm water should only come up to your hips and cover your buttocks.   Breast care  In the first few days after delivery, your breasts may feel heavy, full, and uncomfortable (breast engorgement). Milk may also leak from your breasts. Ask your health care provider about ways to help relieve the discomfort.  If you are breastfeeding: ? Wear a bra that supports your breasts and fits well. Use breast pads to absorb milk that leaks. ? Keep your nipples clean and dry. Apply creams and ointments as told. ? You may have uterine contractions every time you breastfeed for up to several weeks after delivery. This helps your uterus return to its normal size. ? If you have any problems with breastfeeding, notify your health care provider or lactation consultant.  If you are not breastfeeding: ? Avoid touching your breasts. Do not squeeze out (express) milk. Doing this can make your breasts produce more milk. ? Wear a good-fitting bra and use cold packs to help with swelling. Intimacy   and sexuality  Ask your health care provider when you can engage in sexual activity. This may depend upon: ? Your risk of infection. ? How fast you are healing. ? Your comfort and desire to engage in sexual activity.  You are able to get pregnant after delivery, even if you have not had your period. Talk with your health care provider about methods of birth control (contraception) or family planning if you desire future  pregnancies. Medicines  Take over-the-counter and prescription medicines only as told by your health care provider.  Take an over-the-counter stool softener to help ease bowel movements as told by your health care provider.  If you were prescribed an antibiotic medicine, take it as told by your health care provider. Do not stop taking the antibiotic even if you start to feel better.  Review all previous and current prescriptions to check for possible transfer into breast milk. Activity  Gradually return to your normal activities as told by your health care provider.  Rest as much as possible. Nap while your baby is sleeping. Eating and drinking  Drink enough fluid to keep your urine pale yellow.  To help prevent or relieve constipation, eat high-fiber foods every day.  Choose healthy eating to support breastfeeding or weight loss goals.  Take your prenatal vitamins until your health care provider tells you to stop.   General tips/recommendations  Do not use any products that contain nicotine or tobacco. These products include cigarettes, chewing tobacco, and vaping devices, such as e-cigarettes. If you need help quitting, ask your health care provider.  Do not drink alcohol, especially if you are breastfeeding.  Do not take medications or drugs that are not prescribed to you, especially if you are breastfeeding.  Visit your health care provider for a postpartum checkup within the first 3-6 weeks after delivery.  Complete a comprehensive postpartum visit no later than 12 weeks after delivery.  Keep all follow-up visits for you and your baby. Contact a health care provider if:  You feel unusually sad or worried.  Your breasts become red, painful, or hard.  You have a fever or other signs of an infection.  You have bleeding that is soaking through one pad an hour or you have blood clots.  You have a severe headache that doesn't go away or you have vision changes.  You  have nausea and vomiting and are unable to eat or drink anything for 24 hours. Get help right away if:  You have chest pain or difficulty breathing.  You have sudden, severe leg pain.  You faint or have a seizure.  You have thoughts about hurting yourself or your baby. If you ever feel like you may hurt yourself or others, or have thoughts about taking your own life, get help right away. Go to your nearest emergency department or:  Call your local emergency services (911 in the [place]  The National Suicide Prevention Lifeline at 1-800-273-8255. This suicide crisis helpline is open 24 hours a day.  Text the Crisis Text Line at 741741 (in the [place] Summary  The period of time after you deliver your newborn up to 6-12 weeks after delivery is called the postpartum period.  Keep all follow-up visits for you and your baby.  Review all previous and current prescriptions to check for possible transfer into breast milk.  Contact a health care provider if you feel unusually sad or worried during the postpartum period. This information is not intended to replace advice given to   you by your health care provider. Make sure you discuss any questions you have with your health care provider. Document Revised: 07/22/2020 Document Reviewed: 07/22/2020 Elsevier Patient Education  2021 Elsevier Inc.  

## 2021-04-04 ENCOUNTER — Telehealth: Payer: Self-pay | Admitting: General Practice

## 2021-04-04 NOTE — Telephone Encounter (Signed)
Transition Care Management Unsuccessful Follow-up Telephone Call  Date of discharge and from where:  [facility] for care  Attempts:  1st Attempt  Reason for unsuccessful TCM follow-up call:  Left voice message

## 2021-04-06 NOTE — Telephone Encounter (Signed)
Transition Care Management Unsuccessful Follow-up Telephone Call  Date of discharge and from where:  [facility]  Attempts:  2nd Attempt  Reason for unsuccessful TCM follow-up call:  Left voice message

## 2021-04-07 ENCOUNTER — Encounter: Payer: Self-pay | Admitting: Obstetrics and Gynecology

## 2021-04-07 ENCOUNTER — Ambulatory Visit (INDEPENDENT_AMBULATORY_CARE_PROVIDER_SITE_OTHER): Payer: Medicaid Other | Admitting: Obstetrics and Gynecology

## 2021-04-07 ENCOUNTER — Other Ambulatory Visit: Payer: Self-pay

## 2021-04-07 VITALS — BP 126/79 | HR 88 | Resp 16 | Ht 68.5 in | Wt 231.0 lb

## 2021-04-07 DIAGNOSIS — O135 Gestational [pregnancy-induced] hypertension without significant proteinuria, complicating the puerperium: Secondary | ICD-10-CM

## 2021-04-07 DIAGNOSIS — R29898 Other symptoms and signs involving the musculoskeletal system: Secondary | ICD-10-CM

## 2021-04-07 MED ORDER — CYCLOBENZAPRINE HCL 10 MG PO TABS: 10.0000 mg | ORAL_TABLET | Freq: Three times a day (TID) | ORAL | 2 refills | Status: DC | PRN

## 2021-04-07 NOTE — Progress Notes (Signed)
30 yo G1P1 s/p SVD on 04/01/21 complicated by postpartum hypertension. Patient discharged home on procardia 30 mg daily. Patient is breastfeeding without difficulty. She reports some lower extremity weakness and shooting pains. Patient reports a history of scoliosis and pain seems to have worsened following epidural.  Past Medical History:  Diagnosis Date  . Anxiety and depression    no current medications  . Asthma   . Endometriosis   . Environmental and seasonal allergies   . PCOS (polycystic ovarian syndrome)   . Seasonal allergies    Past Surgical History:  Procedure Laterality Date  . TONSILLECTOMY    . TONSILLECTOMY AND ADENOIDECTOMY     Family History  Problem Relation Age of Onset  . Thyroid disease Mother   . Hyperlipidemia Mother   . Hypertension Mother   . Cataracts Mother   . Hyperlipidemia Father   . COPD Father   . Heart attack Father   . Hypertension Father   . Diabetes Father   . Prostate cancer Maternal Grandfather    Social History   Tobacco Use  . Smoking status: Former Smoker    Types: Cigarettes  . Smokeless tobacco: Never Used  Vaping Use  . Vaping Use: Former  . Quit date: 08/04/2020  . Substances: Nicotine  Substance Use Topics  . Alcohol use: Not Currently    Comment: 3 drinks/week, beer or liquor  . Drug use: Yes    Frequency: 7.0 times per week    Types: Marijuana   ROS See pertinent in HPI. All other systems reviewed and non contributory Blood pressure 126/79, pulse 88, resp. rate 16, height 5' 8.5" (1.74 m), weight 231 lb (104.8 kg), currently breastfeeding. GENERAL: Well-developed, well-nourished female in no acute distress.  NEURO: alert and oriented x 3  A/P 30 yo s/p SVD on 5/13 with pp hypertension - Continue nifedipine - Patient referred to PT and neurology - Rx flexeril also provided. - Follow up as schedule don 6/23

## 2021-04-07 NOTE — Progress Notes (Signed)
[place] score is 6

## 2021-04-08 NOTE — Telephone Encounter (Signed)
Transition Care Management Unsuccessful Follow-up Telephone Call  Date of discharge and from where:  [facility]  Attempts:  3rd Attempt  Reason for unsuccessful TCM follow-up call:  Left voice message

## 2021-04-26 ENCOUNTER — Ambulatory Visit (INDEPENDENT_AMBULATORY_CARE_PROVIDER_SITE_OTHER): Payer: Medicaid Other | Admitting: Obstetrics and Gynecology

## 2021-04-26 ENCOUNTER — Encounter: Payer: Self-pay | Admitting: Obstetrics and Gynecology

## 2021-04-26 ENCOUNTER — Other Ambulatory Visit: Payer: Self-pay

## 2021-04-26 VITALS — BP 110/73 | HR 86 | Temp 98.3°F | Resp 16 | Ht 69.0 in | Wt 222.0 lb

## 2021-04-26 DIAGNOSIS — N61 Mastitis without abscess: Secondary | ICD-10-CM

## 2021-04-26 MED ORDER — CEPHALEXIN 500 MG PO CAPS: 500.0000 mg | ORAL_CAPSULE | Freq: Four times a day (QID) | ORAL | 0 refills | Status: AC

## 2021-04-26 NOTE — Progress Notes (Signed)
RT breast.

## 2021-04-26 NOTE — Progress Notes (Signed)
GYNECOLOGY ENCOUNTER NOTE  History:     Tonya Hart is a 30 y.o. G3P1001 female here with complaints of right breast pain, ? Clogged milk duct in right breast. She reports 101.3 fever on Friday, chills/ sweating. No URI symptoms. Reports readness to both breast. Pain/ lumpy in right breast only. Status post vaginal delivery on 5/13; exclusively breast feeding ever 3 hours.   Obstetric History OB History  Gravida Para Term Preterm AB Living  1 1 1  0 0 1  SAB IAB Ectopic Multiple Live Births  0 0 0 0 1    # Outcome Date GA Lbr Len/2nd Weight Sex Delivery Anes PTL Lv  1 Term 04/01/21 [redacted]w[redacted]d 01:24 / 01:26 8 lb 1.8 oz (3.68 kg) F Vag-Spont EPI  LIV    Past Medical History:  Diagnosis Date  . Anxiety and depression    no current medications  . Asthma   . Endometriosis   . Environmental and seasonal allergies   . PCOS (polycystic ovarian syndrome)   . Seasonal allergies     Past Surgical History:  Procedure Laterality Date  . TONSILLECTOMY    . TONSILLECTOMY AND ADENOIDECTOMY      Current Outpatient Medications on File Prior to Visit  Medication Sig Dispense Refill  . acetaminophen (TYLENOL) 500 MG tablet Take 2 tablets (1,000 mg total) by mouth every 6 (six) hours as needed (for pain scale < 4).    . amoxicillin (AMOXIL) 500 MG capsule Take 500 mg by mouth 3 (three) times daily.    . cyclobenzaprine (FLEXERIL) 10 MG tablet Take 1 tablet (10 mg total) by mouth 3 (three) times daily as needed for muscle spasms. 30 tablet 2  . EPINEPHrine 0.3 mg/0.3 mL IJ SOAJ injection     . ibuprofen (ADVIL) 600 MG tablet Take 1 tablet (600 mg total) by mouth every 6 (six) hours as needed. 30 tablet 0  . NIFEdipine (ADALAT CC) 30 MG 24 hr tablet Take 1 tablet (30 mg total) by mouth daily. 30 tablet 3  . norethindrone (ORTHO MICRONOR) 0.35 MG tablet Take 1 tablet (0.35 mg total) by mouth daily. 28 tablet 11  . Prenatal Vit-Fe Fumarate-FA (MULTIVITAMIN-PRENATAL) 27-0.8 MG TABS tablet Take 1  tablet by mouth daily at 12 noon.    . busPIRone (BUSPAR) 10 MG tablet Take 1 tablet (10 mg total) by mouth 3 (three) times daily. (Patient not taking: No sig reported) 30 tablet 2   No current facility-administered medications on file prior to visit.    Allergies  Allergen Reactions  . Chicken Meat (Diagnostic) Anaphylaxis  . Bee Venom Hives    swelling  . Latex Hives  . Soap Hives    Dove soap    Social History:  reports that she has quit smoking. Her smoking use included cigarettes. She has never used smokeless tobacco. She reports previous alcohol use. She reports current drug use. Frequency: 7.00 times per week. Drug: Marijuana.  Family History  Problem Relation Age of Onset  . Thyroid disease Mother   . Hyperlipidemia Mother   . Hypertension Mother   . Cataracts Mother   . Hyperlipidemia Father   . COPD Father   . Heart attack Father   . Hypertension Father   . Diabetes Father   . Prostate cancer Maternal Grandfather     The following portions of the patient's history were reviewed and updated as appropriate: allergies, current medications, past family history, past medical history, past social history, past surgical history  and problem list.  Review of Systems Pertinent items noted in HPI and remainder of comprehensive ROS otherwise negative.  Physical Exam:  BP 110/73   Pulse 86   Temp 98.3 F (36.8 C) (Temporal)   Resp 16   Ht 5\' 9"  (1.753 m)   Wt 222 lb (100.7 kg)   Breastfeeding Yes   BMI 32.78 kg/m  CONSTITUTIONAL: Well-developed, well-nourished female in no acute distress.  HENT:  Normocephalic.  NEUROLOGIC: Alert and oriented to person, place, and time.  PSYCHIATRIC: Normal mood and affect.  BREASTS: Symmetric in size. Palpable clogged milk duct @ 12 o'clock of right breast. Area is not fluctuant, no erythema specifically to that area; area is not warm to touch.  Overall erythema noted in bilateral breast.  bilaterally. Performed in the presence of a  chaperone.  Assessment and Plan:   1. Mastitis  RX:  Keflex Call the office if symptoms do not improve in 3-5 days.     Hodges Treiber, , NP [facility] Tonya Hart, [facility]

## 2021-04-27 ENCOUNTER — Ambulatory Visit: Payer: Medicaid Other | Admitting: Physical Therapy

## 2021-04-27 DIAGNOSIS — N61 Mastitis without abscess: Secondary | ICD-10-CM | POA: Insufficient documentation

## 2021-04-28 ENCOUNTER — Encounter: Payer: Self-pay | Admitting: Medical-Surgical

## 2021-05-05 NOTE — Progress Notes (Signed)
Post Partum Visit Note  Tonya Hart is a 30 y.o. [id number] female who presents for a postpartum visit. She is 6 weeks postpartum following a normal spontaneous vaginal delivery.  I have fully reviewed the prenatal and intrapartum course. The delivery was at 40.6 gestational weeks.  Anesthesia: epidural. Postpartum course has been unremarkable except for Rt breast mastitis. Baby is doing well. Baby is feeding by breast. Bleeding pink. Bowel function is normal. Bladder function is normal. Patient is sexually active. Contraception method is oral progesterone-only contraceptive. Postpartum depression screening: positive.  She does note back pain, especially on her left side, with radiation down to her knee.   Edinburgh Postnatal Depression Scale - 05/12/21 1310       Edinburgh Postnatal Depression Scale:  In the Past 7 Days   I have been able to laugh and see the funny side of things. 0    I have looked forward with enjoyment to things. 1    I have blamed myself unnecessarily when things went wrong. 2    I have been anxious or worried for no good reason. 2    I have felt scared or panicky for no good reason. 2    Things have been getting on top of me. 2    I have been so unhappy that I have had difficulty sleeping. 1    I have felt sad or miserable. 1    I have been so unhappy that I have been crying. 1    The thought of harming myself has occurred to me. 0    Edinburgh Postnatal Depression Scale Total 12             Health Maintenance Due  Topic Date Due   COVID-19 Vaccine (1) Never done    The following portions of the patient's history were reviewed and updated as appropriate: allergies, current medications, past family history, past medical history, past social history, past surgical history, and problem list.  Review of Systems Pertinent items are noted in HPI.  Objective:  BP 128/87   Pulse (!) 116   Resp 16   Ht 5\' 10"  (1.778 m)   Wt 221 lb (100.2 kg)   BMI 31.71  kg/m    General:  alert, cooperative, and no distress   Breasts:  normal small clogged duct in right breast at 11 o'clock, approximately 4cm from areola  Lungs: clear to auscultation bilaterally  Heart:  regular rate and rhythm, S1, S2 normal, no murmur, click, rub or gallop  Abdomen: soft, non-tender; bowel sounds normal; no masses,  no organomegaly   GU exam:   Small area of exposed submucosa that was cauterized with silver nitrate.          Assessment:   1. Postpartum exam   2. Depression with anxiety   3. Anxiety state   4. Gestational hypertension, antepartum   5. Mastitis      Plan:   Essential components of care per ACOG recommendations:  1.  Mood and well being: Patient with positive depression screening today. Reviewed local resources for support.  - Patient tobacco use? No.   - hx of drug use? No.    2. Infant care and feeding:  -Patient currently breastmilk feeding? Yes. Reviewed importance of draining breast regularly to support lactation.  -Social determinants of health (SDOH) reviewed in EPIC. No concerns  3. Sexuality, contraception and birth spacing - Patient does not want a pregnancy in the next year.   -  Reviewed forms of contraception in tiered fashion. Patient desired oral progesterone-only contraceptive today.   - Discussed birth spacing of 18 months  4. Sleep and fatigue -Encouraged family/partner/community support of 4 hrs of uninterrupted sleep to help with mood and fatigue  5. Physical Recovery  - Discussed patients delivery and complications. She describes her labor as good. - Patient had a Vaginal, no problems at delivery. Patient had a 1st degree laceration. Perineal healing reviewed. Patient expressed understanding - Patient has urinary incontinence? No. - Patient is safe to resume physical and sexual activity  6.  Health Maintenance - HM due items addressed - n/a - Last pap smear  Diagnosis  Date Value Ref Range Status  04/05/2020    Final   - Negative for intraepithelial lesion or malignancy (NILM)   Pap smear not done at today's visit.  -Breast Cancer screening indicated? No.   7. Chronic Disease/Pregnancy Condition follow up:  depression/anxiety - I discussed with the patient that it was safe to take buspar and lexapro while breastfeeding. Her Primary care provider had told her that it was not. Studies have overwhelmingly shown that the psychosocial development of babies are better when the anxiety and depression are controlled.   Stop BP medication.     Levie Heritage, DO [facility] [employer], [facility]

## 2021-05-06 ENCOUNTER — Other Ambulatory Visit: Payer: Self-pay | Admitting: Obstetrics and Gynecology

## 2021-05-06 DIAGNOSIS — N61 Mastitis without abscess: Secondary | ICD-10-CM

## 2021-05-12 ENCOUNTER — Encounter: Payer: Self-pay | Admitting: Physician Assistant

## 2021-05-12 ENCOUNTER — Encounter: Payer: Self-pay | Admitting: Family Medicine

## 2021-05-12 ENCOUNTER — Ambulatory Visit (INDEPENDENT_AMBULATORY_CARE_PROVIDER_SITE_OTHER): Payer: Medicaid Other | Admitting: Family Medicine

## 2021-05-12 ENCOUNTER — Ambulatory Visit (INDEPENDENT_AMBULATORY_CARE_PROVIDER_SITE_OTHER): Payer: Medicaid Other | Admitting: Physician Assistant

## 2021-05-12 ENCOUNTER — Ambulatory Visit (INDEPENDENT_AMBULATORY_CARE_PROVIDER_SITE_OTHER): Payer: Medicaid Other

## 2021-05-12 ENCOUNTER — Other Ambulatory Visit: Payer: Self-pay

## 2021-05-12 VITALS — BP 115/72 | HR 105 | Ht 70.0 in | Wt 221.1 lb

## 2021-05-12 DIAGNOSIS — S43005A Unspecified dislocation of left shoulder joint, initial encounter: Secondary | ICD-10-CM

## 2021-05-12 DIAGNOSIS — R202 Paresthesia of skin: Secondary | ICD-10-CM

## 2021-05-12 DIAGNOSIS — M6281 Muscle weakness (generalized): Secondary | ICD-10-CM | POA: Diagnosis not present

## 2021-05-12 DIAGNOSIS — M25512 Pain in left shoulder: Secondary | ICD-10-CM

## 2021-05-12 DIAGNOSIS — F411 Generalized anxiety disorder: Secondary | ICD-10-CM

## 2021-05-12 DIAGNOSIS — O139 Gestational [pregnancy-induced] hypertension without significant proteinuria, unspecified trimester: Secondary | ICD-10-CM

## 2021-05-12 DIAGNOSIS — F418 Other specified anxiety disorders: Secondary | ICD-10-CM | POA: Diagnosis not present

## 2021-05-12 DIAGNOSIS — N61 Mastitis without abscess: Secondary | ICD-10-CM | POA: Diagnosis not present

## 2021-05-12 IMAGING — DX DG SHOULDER 2+V*L*
3 series · 3 of 3 positions shown · non-contrast
Comparison: None
COMPARISON: None

Addendum:
CLINICAL DATA: LEFT shoulder pain, weakness, paresthesias, onset of
pain while reaching to her back 3 days ago, question dislocation

EXAM:
LEFT SHOULDER - 2+ VIEW

[shoulder grashey]
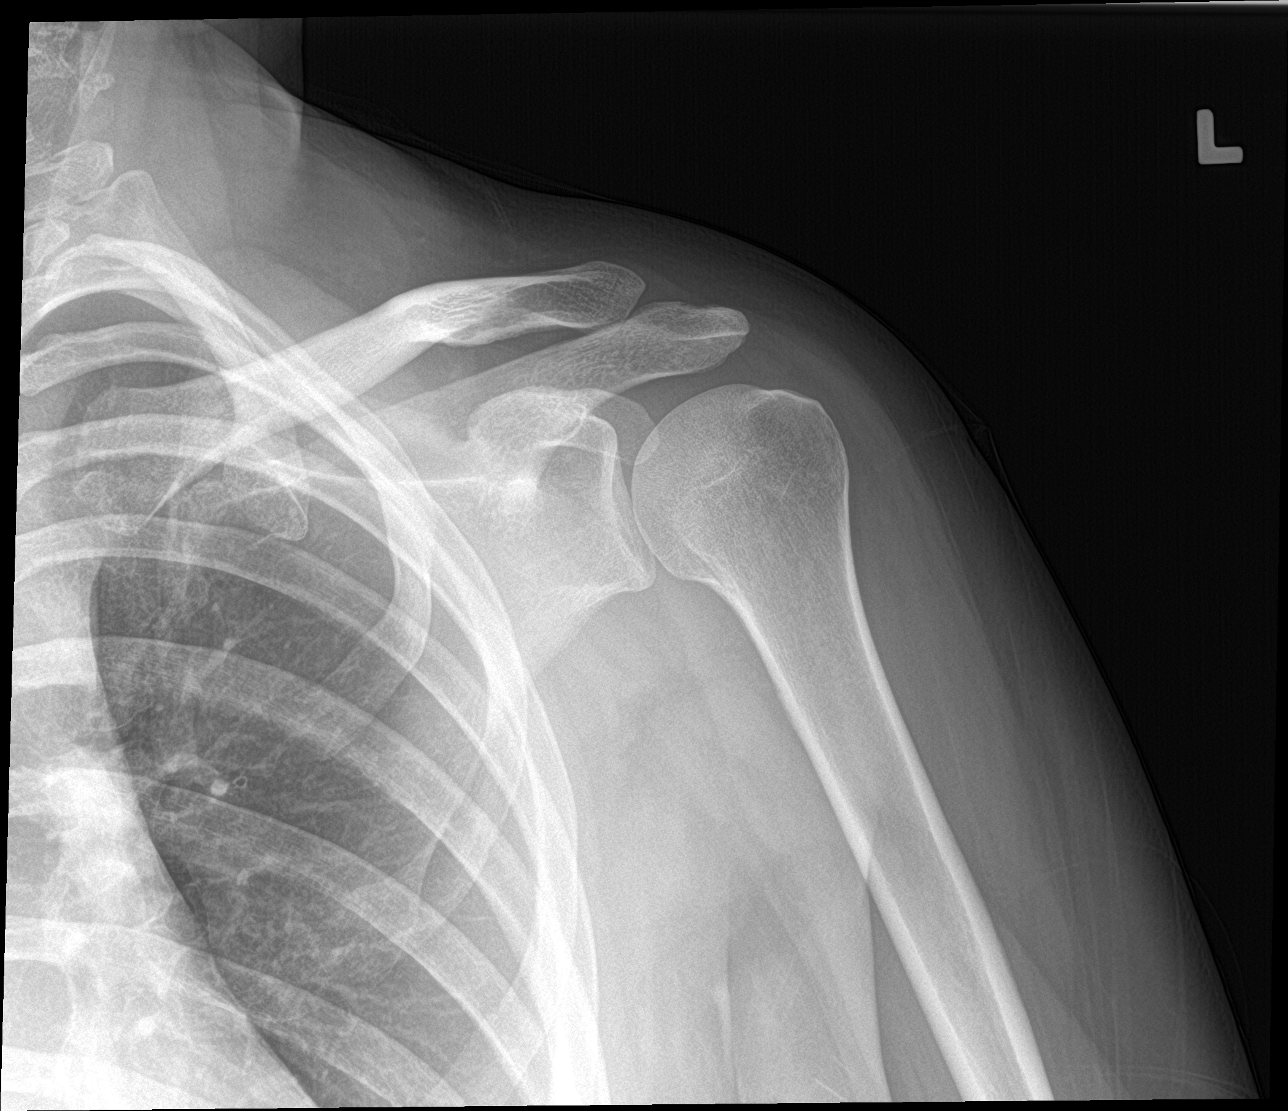

[shoulder y view]
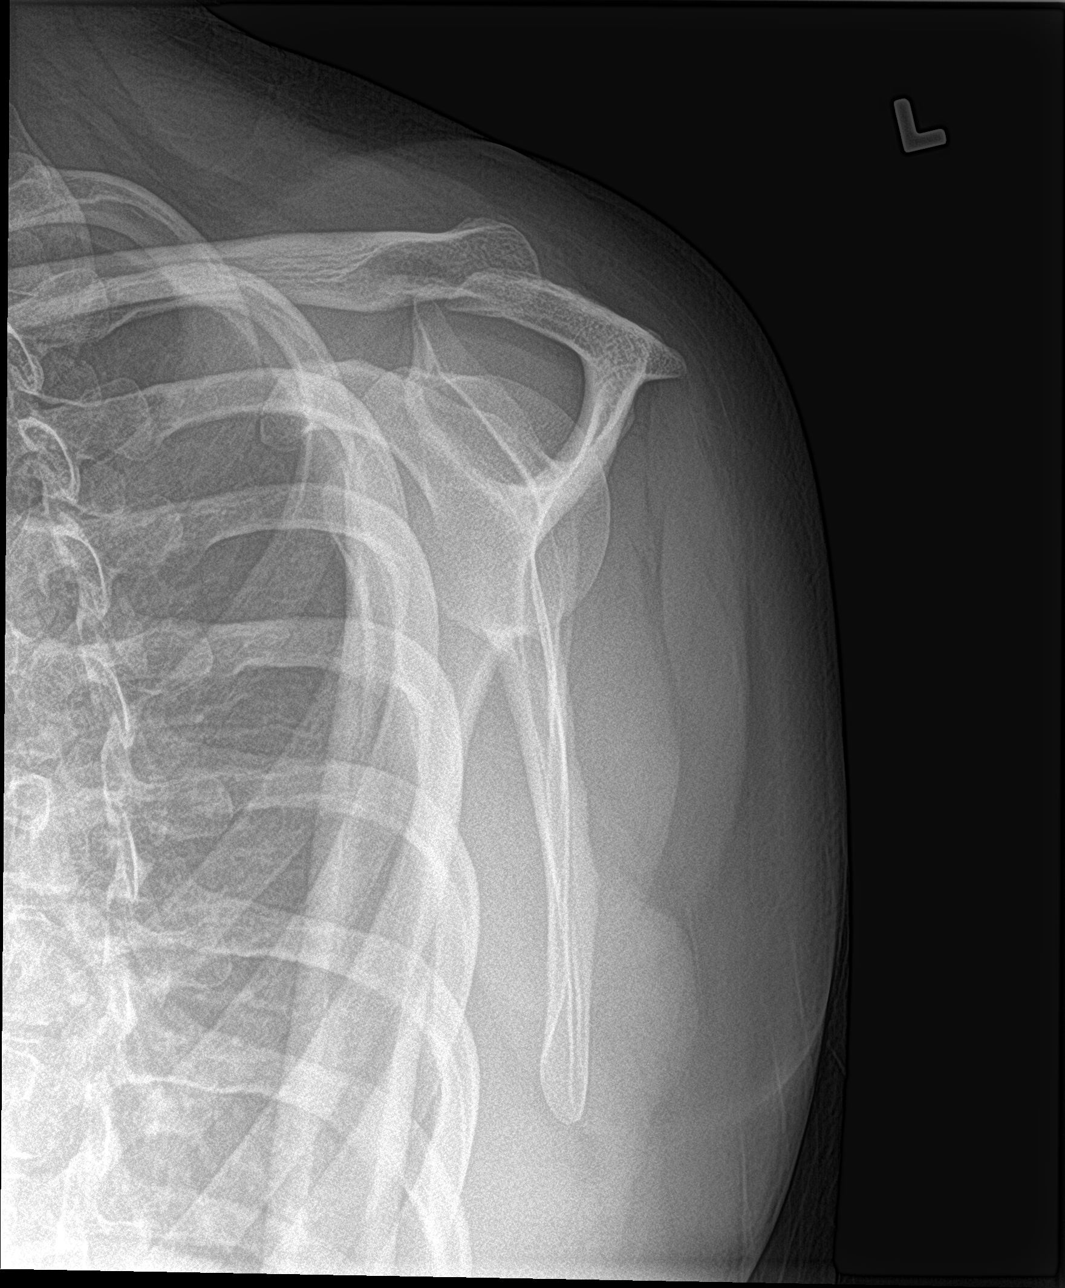

[shoulder axillary]
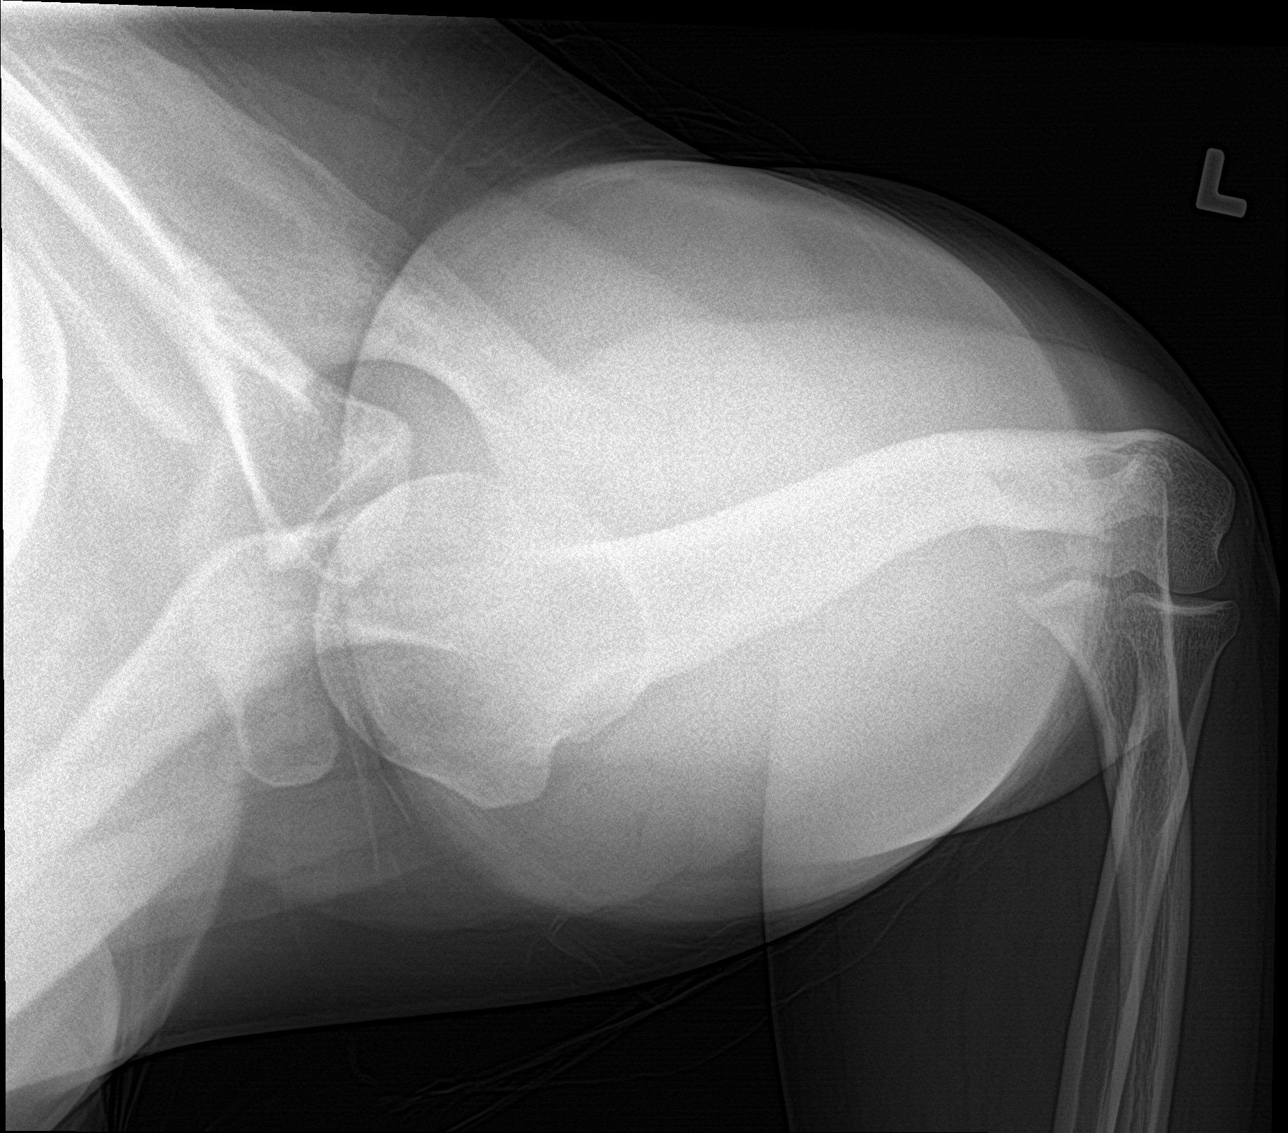

[3 of 3 positions shown; findings below may reference images not displayed]

FINDINGS: Osseous mineralization normal.

AC joint alignment normal.

No acute fracture, dislocation or bone destruction.

Visualized ribs unremarkable.
IMPRESSION: Normal exam.

ADDENDUM:
Additional history: Multiple prior dislocations

Reviewed case with referring [facility].

On the axillary view, a focal deformity is seen along the margin of
the humeral head likely representing a Hill-Sachs impaction
deformity, sequela of prior dislocation.

*** End of Addendum ***
FINDINGS: Osseous mineralization normal.

AC joint alignment normal.

No acute fracture, dislocation or bone destruction.

Visualized ribs unremarkable.
IMPRESSION: Normal exam.

## 2021-05-12 MED ORDER — BUSPIRONE HCL 10 MG PO TABS: 10.0000 mg | ORAL_TABLET | Freq: Three times a day (TID) | ORAL | 2 refills | Status: DC

## 2021-05-12 MED ORDER — ESCITALOPRAM OXALATE 20 MG PO TABS: 20.0000 mg | ORAL_TABLET | Freq: Every day | ORAL | 0 refills | Status: DC

## 2021-05-12 NOTE — Progress Notes (Signed)
Acute Office Visit  Subjective:    Patient ID: Tonya Hart, female    DOB: 07/18/1991, 30 y.o.   MRN: [id number]  Chief Complaint  Patient presents with   Shoulder Pain    Left shoulder, was reaching to her back in the shower 4 days ago and heard a popping sound, radiating pain, decreased strength, decreased movement, rates pain at a 7.5/10 on the pain scale     Shoulder Pain  The pain is present in the left shoulder and left arm. This is a new problem. The current episode started in the past 7 days (x 4 days). There has been no history of extremity trauma (while reaching overhead bathing, Felt a pop and shooting pain down the left arm to the elbow, followed by pins and needles sensation). The problem occurs constantly. The problem has been unchanged. The quality of the pain is described as sharp. The pain is at a severity of 7/10. Associated symptoms include a limited range of motion and tingling. The symptoms are aggravated by activity and contact. She has tried NSAIDS (Flexeril) for the symptoms. The treatment provided no relief. +multiple discloations (twice a year), hx of bursitis   Past Medical History:  Diagnosis Date   Anxiety and depression    no current medications   Asthma    Endometriosis    Environmental and seasonal allergies    PCOS (polycystic ovarian syndrome)    Seasonal allergies     Past Surgical History:  Procedure Laterality Date   TONSILLECTOMY     TONSILLECTOMY AND ADENOIDECTOMY      Family History  Problem Relation Age of Onset   Thyroid disease Mother    Hyperlipidemia Mother    Hypertension Mother    Cataracts Mother    Hyperlipidemia Father    COPD Father    Heart attack Father    Hypertension Father    Diabetes Father    Prostate cancer Maternal Grandfather     Social History   Socioeconomic History   Marital status: Single    Spouse name: Not on file   Number of children: Not on file   Years of education: Not on file   Highest  education level: Not on file  Occupational History   Occupation: ISS    Employer: [employer]  Tobacco Use   Smoking status: Former    Pack years: 0.00    Types: Cigarettes   Smokeless tobacco: Never  Vaping Use   Vaping Use: Former   Quit date: 08/04/2020   Substances: Nicotine  Substance and Sexual Activity   Alcohol use: Not Currently    Comment: 3 drinks/week, beer or liquor   Drug use: Yes    Frequency: 7.0 times per week    Types: Marijuana   Sexual activity: Yes    Partners: Male    Birth control/protection: Pill  Other Topics Concern   Not on file  Social History Narrative   Not on file   Social Determinants of Health   Financial Resource Strain: Not on file  Food Insecurity: Not on file  Transportation Needs: Not on file  Physical Activity: Not on file  Stress: Not on file  Social Connections: Not on file  Intimate Partner Violence: Not on file    Outpatient Medications Prior to Visit  Medication Sig Dispense Refill   acetaminophen (TYLENOL) 500 MG tablet Take 2 tablets (1,000 mg total) by mouth every 6 (six) hours as needed (for pain scale < 4).  busPIRone (BUSPAR) 10 MG tablet Take 1 tablet (10 mg total) by mouth 3 (three) times daily. 30 tablet 2   cyclobenzaprine (FLEXERIL) 10 MG tablet Take 1 tablet (10 mg total) by mouth 3 (three) times daily as needed for muscle spasms. 30 tablet 2   EPINEPHrine 0.3 mg/0.3 mL IJ SOAJ injection      escitalopram (LEXAPRO) 20 MG tablet Take 1 tablet (20 mg total) by mouth daily. Take 1/2 daily for 2 weeks, then full tab daily. 90 tablet 0   NIFEdipine (ADALAT CC) 30 MG 24 hr tablet Take 1 tablet (30 mg total) by mouth daily. 30 tablet 3   norethindrone (ORTHO MICRONOR) 0.35 MG tablet Take 1 tablet (0.35 mg total) by mouth daily. 28 tablet 11   Prenatal Vit-Fe Fumarate-FA (MULTIVITAMIN-PRENATAL) 27-0.8 MG TABS tablet Take 1 tablet by mouth daily at 12 noon.     No facility-administered medications prior to  visit.    Allergies  Allergen Reactions   Chicken Meat (Diagnostic) Anaphylaxis   Bee Venom Hives    swelling   Latex Hives   Soap Hives    Dove soap    Review of Systems  Neurological:  Positive for tingling and weakness (left arm).  All other systems reviewed and are negative.     Objective:    Physical Exam Vitals and nursing note reviewed.  Constitutional:      Appearance: Normal appearance.  Cardiovascular:     Pulses:          Radial pulses are 2+ on the right side and 2+ on the left side.  Musculoskeletal:     Left shoulder: Tenderness and bony tenderness (a/c joint) present. No deformity. Decreased range of motion. Decreased strength.     Left upper arm: Tenderness (anterior and lateral) present. No swelling or deformity.     Left elbow: Normal.     Left forearm: Normal.  Skin:    Capillary Refill: Capillary refill takes less than 2 seconds.  Neurological:     Mental Status: She is alert.     Motor: Weakness (LUE) present.     Deep Tendon Reflexes:     Reflex Scores:      Tricep reflexes are 2+ on the right side and 1+ on the left side.      Bicep reflexes are 2+ on the right side and 2+ on the left side.      Brachioradialis reflexes are 2+ on the right side and 1+ on the left side. Psychiatric:        Behavior: Behavior normal.        Thought Content: Thought content normal.    BP 115/72   Pulse (!) 105   Ht 5\' 10"  (1.778 m)   Wt 221 lb 1.3 oz (100.3 kg)   SpO2 96%   Breastfeeding Yes   BMI 31.72 kg/m    There are no preventive care reminders to display for this patient.  There are no preventive care reminders to display for this patient.      Assessment & Plan:   Problem List Items Addressed This Visit   None Visit Diagnoses     Acute pain of left shoulder    -  Primary   Relevant Orders   DG Shoulder Left   AMB referral to sports medicine   Muscle weakness of left arm       Relevant Orders   AMB referral to sports medicine    Shoulder dislocation, left, initial encounter  Relevant Orders   AMB referral to sports medicine      Personally reviewed X-ray left shoulder with Dr. Lyn Hollingshead - suspected anterior dislocation or subluxation Recommended patient go to [facility] for evaluation with specialist due to weakness and paresthesias. Patient declined and prefers to see a [facility] provider tomorrow if possible Counseled patient on risk of nerve and vascular injury by delaying specialty care and patient expressed understanding Patient placed in left shoulder sling Cont NSAIDs/Tylenol for pain control  Scheduled with Sports Medicine tomorrow at 4pm  No orders of the defined types were placed in this encounter.    Carlis Stable, [place]

## 2021-05-12 NOTE — Patient Instructions (Signed)
Shoulder Dislocation  A shoulder dislocation happens when the upper arm bone (humerus) moves out of the shoulder joint. The shoulder joint is the part of the shoulder where the humerus, shoulder blade (scapula), and collarbone (clavicle) meet. What are the causes? This condition is often caused by: A fall. A hard, direct hit to the shoulder. A forceful movement of the shoulder. What increases the risk? You are more likely to develop this condition if you play sports. What are the signs or symptoms? Symptoms of this condition include: Deformity of the shoulder. Severe pain. Inability to move the shoulder. Numbness, weakness, or tingling in your neck or down your arm. Bruising or swelling around your shoulder. How is this diagnosed? This condition is diagnosed with a physical exam. After the exam, tests may be done to check for related problems. Tests may include: X-ray. This may be done to check for broken bones. MRI. This may be done to check for damage to the tissues around the shoulder. Electromyogram. This may be done to check for nerve damage. How is this treated? This condition is treated with a procedure to place the humerus back in the joint. This procedure is called a reduction. There are two types of reduction: Closed reduction. The humerus is placed back in the joint without surgery. The health care provider uses his or her hands to guide the bone back into place. Open reduction. Surgery is done to place the humerus back in the joint. An open reduction may be recommended if: You have a weak shoulder joint or weak ligaments. You have had more than one shoulder dislocation. The nerves or blood vessels around your shoulder have been damaged. After reduction, your shoulder and arm will be placed in a brace or sling toprevent it from moving. After the brace or sling is removed, you will have physical therapy to helpimprove the range of motion in your shoulder joint. Follow these  instructions at home: Medicines Take over-the-counter and prescription medicines only as told by your health care provider. Ask your health care provider if the medicine prescribed to you: Requires you to avoid driving or using heavy machinery. Can cause constipation. You may need to take these actions to prevent or treat constipation: Drink enough fluid to keep your urine pale yellow. Take over-the-counter or prescription medicines. Eat foods that are high in fiber, such as beans, whole grains, and fresh fruits and vegetables. Limit foods that are high in fat and processed sugars, such as fried or sweet foods. If you have a brace or sling: Wear the brace or sling as told by your health care provider. Remove it only as told by your health care provider. Loosen the brace or sling if your fingers tingle, become numb, or turn cold and blue. Keep the brace or sling clean. If the brace or sling is not waterproof: Do not let it get wet. Cover it with a watertight covering when you take a bath or shower. Managing pain, stiffness, and swelling  If directed, put ice on the injured area. If you have a removable brace or sling, remove it as told by your health care provider. Put ice in a plastic bag. Place a towel between your skin and the bag. Leave the ice on for 20 minutes, 2-3 times per day. Move your fingers often to reduce stiffness and swelling. Raise (elevate) the injured area above the level of your heart while you are sitting or lying down.  Activity Do not lift your arm above shoulder  level until your health care provider approves. Do not lift anything until your health care provider says that it is safe. Do not push or pull things until your health care provider approves. Return to your normal activities as told by your health care provider. Ask your health care provider what activities are safe for you. Perform range-of-motion exercises only as told by your health care  provider. Exercise your hand by squeezing a soft ball. This helps to decrease stiffness and swelling in your hand and wrist. General instructions Do not drive while wearing a brace or sling on a hand that you use for driving. Ask your health care provider when it is safe to drive after the brace or sling is removed. Do not take baths, swim, or use a hot tub until your health care provider approves. Ask your health care provider if you may take showers. You may only be allowed to take sponge baths. Do not use any products that contain nicotine or tobacco, such as cigarettes, e-cigarettes, and chewing tobacco. These can delay healing. If you need help quitting, ask your health care provider. Keep all follow-up visits as told by your health care provider. This is important. Contact a health care provider if: Your brace or sling gets damaged. Get help right away if: Your pain gets worse rather than better. You lose feeling in your arm or hand. Your arm or hand becomes white and cold. Summary A shoulder dislocation happens when the upper arm bone moves out of the shoulder joint. It is usually caused by a fall, a strong hit to the shoulder, or a forceful movement of the shoulder. It causes severe pain, inability to move the shoulder, numbness, weakness, or tingling. This condition is treated with either closed or open reduction. You will also be given a brace or sling. You will do exercises to increase your shoulder's range of motion. Contact a health care provider if your brace or sling gets damaged. Get help right away if your pain gets worse, you lose feeling in your arm or hand, or your arm or hand becomes white or cold. This information is not intended to replace advice given to you by your health care provider. Make sure you discuss any questions you have with your healthcare provider. Document Revised: 06/05/2018 Document Reviewed: 06/05/2018 Elsevier Patient Education  2022 [employer].

## 2021-05-13 ENCOUNTER — Ambulatory Visit: Payer: Medicaid Other | Admitting: Medical-Surgical

## 2021-05-13 ENCOUNTER — Ambulatory Visit (INDEPENDENT_AMBULATORY_CARE_PROVIDER_SITE_OTHER): Payer: Medicaid Other | Admitting: Sports Medicine

## 2021-05-13 DIAGNOSIS — M24412 Recurrent dislocation, left shoulder: Secondary | ICD-10-CM

## 2021-05-13 MED ORDER — HYDROCODONE-ACETAMINOPHEN 5-325 MG PO TABS: 1.0000 | ORAL_TABLET | Freq: Three times a day (TID) | ORAL | 0 refills | Status: DC | PRN

## 2021-05-13 MED ORDER — IBUPROFEN 800 MG PO TABS: 800.0000 mg | ORAL_TABLET | Freq: Three times a day (TID) | ORAL | 2 refills | Status: DC | PRN
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
## 2021-05-23 ENCOUNTER — Other Ambulatory Visit: Payer: Self-pay

## 2021-05-23 DIAGNOSIS — M24412 Recurrent dislocation, left shoulder: Secondary | ICD-10-CM

## 2021-05-24 MED ORDER — HYDROCODONE-ACETAMINOPHEN 5-325 MG PO TABS: 1.0000 | ORAL_TABLET | Freq: Three times a day (TID) | ORAL | 0 refills | Status: DC | PRN

## 2021-06-01 ENCOUNTER — Other Ambulatory Visit: Payer: Self-pay

## 2021-06-01 DIAGNOSIS — M24412 Recurrent dislocation, left shoulder: Secondary | ICD-10-CM

## 2021-06-02 ENCOUNTER — Ambulatory Visit (INDEPENDENT_AMBULATORY_CARE_PROVIDER_SITE_OTHER): Payer: Medicaid Other | Admitting: Physician Assistant

## 2021-06-02 ENCOUNTER — Other Ambulatory Visit: Payer: Self-pay

## 2021-06-02 VITALS — BP 129/84 | HR 111 | Ht 70.0 in | Wt 217.0 lb

## 2021-06-02 DIAGNOSIS — S43012A Anterior subluxation of left humerus, initial encounter: Secondary | ICD-10-CM | POA: Insufficient documentation

## 2021-06-02 DIAGNOSIS — S43012D Anterior subluxation of left humerus, subsequent encounter: Secondary | ICD-10-CM | POA: Diagnosis not present

## 2021-06-02 DIAGNOSIS — S43012S Anterior subluxation of left humerus, sequela: Secondary | ICD-10-CM

## 2021-06-02 MED ORDER — HYDROCODONE-ACETAMINOPHEN 5-325 MG PO TABS: 1.0000 | ORAL_TABLET | Freq: Four times a day (QID) | ORAL | 0 refills | Status: AC | PRN

## 2021-06-02 NOTE — Progress Notes (Signed)
Acute Office Visit  Subjective:    Patient ID: Tonya Hart, female    DOB: 04/01/1991, 30 y.o.   MRN: [id number]  Chief Complaint  Patient presents with   Shoulder Pain    HPI Patient is in today for recurrent left shoulder pain/subluxation  She is following with Sports Medicine and has an MRI scheduled for 7/25- will likely be having surgery for recurrent subluxations While breast feeding she had recurrent sharp, severe pain with shoulder rotation radiating down her left arm There was no trauma She is requesting refill of Norco to manage pain - pain is worse at night, can't lay on left side, interferes with sleep She is wearing a sling and resting her left arm   Past Medical History:  Diagnosis Date   Anxiety and depression    no current medications   Asthma    Endometriosis    Environmental and seasonal allergies    PCOS (polycystic ovarian syndrome)    Seasonal allergies     Past Surgical History:  Procedure Laterality Date   TONSILLECTOMY     TONSILLECTOMY AND ADENOIDECTOMY      Family History  Problem Relation Age of Onset   Thyroid disease Mother    Hyperlipidemia Mother    Hypertension Mother    Cataracts Mother    Hyperlipidemia Father    COPD Father    Heart attack Father    Hypertension Father    Diabetes Father    Prostate cancer Maternal Grandfather     Social History   Socioeconomic History   Marital status: Single    Spouse name: Not on file   Number of children: Not on file   Years of education: Not on file   Highest education level: Not on file  Occupational History   Occupation: ISS    Employer: [employer]  Tobacco Use   Smoking status: Former    Types: Cigarettes   Smokeless tobacco: Never  Vaping Use   Vaping Use: Former   Quit date: 08/04/2020   Substances: Nicotine  Substance and Sexual Activity   Alcohol use: Not Currently    Comment: 3 drinks/week, beer or liquor   Drug use: Yes    Frequency: 7.0 times per  week    Types: Marijuana   Sexual activity: Yes    Partners: Male    Birth control/protection: Pill  Other Topics Concern   Not on file  Social History Narrative   Not on file   Social Determinants of Health   Financial Resource Strain: Not on file  Food Insecurity: Not on file  Transportation Needs: Not on file  Physical Activity: Not on file  Stress: Not on file  Social Connections: Not on file  Intimate Partner Violence: Not on file    Outpatient Medications Prior to Visit  Medication Sig Dispense Refill   cyclobenzaprine (FLEXERIL) 10 MG tablet Take 1 tablet (10 mg total) by mouth 3 (three) times daily as needed for muscle spasms. 30 tablet 2   escitalopram (LEXAPRO) 20 MG tablet Take 1 tablet (20 mg total) by mouth daily. Take 1/2 daily for 2 weeks, then full tab daily. 90 tablet 0   ibuprofen (ADVIL) 800 MG tablet Take 1 tablet (800 mg total) by mouth every 8 (eight) hours as needed. 90 tablet 2   norethindrone (ORTHO MICRONOR) 0.35 MG tablet Take 1 tablet (0.35 mg total) by mouth daily. 28 tablet 11   Prenatal Vit-Fe Fumarate-FA (MULTIVITAMIN-PRENATAL) 27-0.8 MG TABS tablet Take  1 tablet by mouth daily at 12 noon.     busPIRone (BUSPAR) 10 MG tablet Take 1 tablet (10 mg total) by mouth 3 (three) times daily. 30 tablet 2   HYDROcodone-acetaminophen (NORCO/VICODIN) 5-325 MG tablet Take 1 tablet by mouth every 8 (eight) hours as needed for moderate pain (Pump and dump breastmilk after you take a hydrocodone dose.). 15 tablet 0   EPINEPHrine 0.3 mg/0.3 mL IJ SOAJ injection  (Patient not taking: Reported on 06/02/2021)     No facility-administered medications prior to visit.    Allergies  Allergen Reactions   Chicken Meat (Diagnostic) Anaphylaxis   Bee Venom Hives    swelling   Latex Hives   Soap Hives    Dove soap    Review of Systems     Objective:    Physical Exam Vitals reviewed.  Constitutional:      General: She is not in acute distress.    Appearance:  Normal appearance. She is not ill-appearing.  Musculoskeletal:     Left shoulder: Tenderness present. No deformity. Decreased range of motion.  Neurological:     Mental Status: She is alert.    BP 129/84   Pulse (!) 111   Ht 5\' 10"  (1.778 m)   Wt 217 lb (98.4 kg)   SpO2 96%   BMI 31.14 kg/m  Wt Readings from Last 3 Encounters:  06/02/21 217 lb (98.4 kg)  05/12/21 221 lb 1.3 oz (100.3 kg)  05/12/21 221 lb (100.2 kg)   Pulse Readings from Last 3 Encounters:  06/02/21 (!) 111  05/12/21 (!) 105  05/12/21 (!) 116    Health Maintenance Due  Topic Date Due   COVID-19 Vaccine (1) Never done   ADDENDUM REPORT: 05/13/2021 10:18   ADDENDUM: Additional history: Multiple prior dislocations   Reviewed case with referring MD.   On the axillary view, a focal deformity is seen along the margin of the humeral head likely representing a Hill-Sachs impaction deformity, sequela of prior dislocation.     Electronically Signed   By: 05/15/2021 M.D.   On: 05/13/2021 10:18    Addended by 05/15/2021, MD on 05/13/2021 10:20 AM    Study Result  Narrative & Impression  CLINICAL DATA:  LEFT shoulder pain, weakness, paresthesias, onset of pain while reaching to her back 3 days ago, question dislocation   EXAM: LEFT SHOULDER - 2+ VIEW   COMPARISON:  None   FINDINGS: Osseous mineralization normal.   AC joint alignment normal.   No acute fracture, dislocation or bone destruction.   Visualized ribs unremarkable.   IMPRESSION: Normal exam.   Electronically Signed: By: 05/15/2021 M.D. On: 05/12/2021 15:31      Assessment & Plan:   Problem List Items Addressed This Visit       Musculoskeletal and Integument   Anterior subluxation of left shoulder - Primary   PDMP reviewed - no red flags Patient is breastfeeding and understands Hydrocodone can be present in breast milk and cause adverse effects in an infant She will limit use to nighttime pain and severe pain and she  will pump and dump for at least 2 hrs after taking opioid and monitor infant closely for lethargy Cont to wear sling Keep MRI appt on 7/25 Keep follow-up with Sports Med  Meds ordered this encounter  Medications   HYDROcodone-acetaminophen (NORCO/VICODIN) 5-325 MG tablet    Sig: Take 1 tablet by mouth every 6 (six) hours as needed for up to 5 days  for moderate pain or severe pain (Pump and dump breastmilk after you take a hydrocodone dose.). Max dose 30g/day    Dispense:  15 tablet    Refill:  0    Order Specific Question:   Supervising Provider    Answer:   Sunnie Nielsen [[id number]]     Carlis Stable, PA-C

## 2021-06-04 ENCOUNTER — Other Ambulatory Visit: Payer: Self-pay | Admitting: Family Medicine

## 2021-06-13 ENCOUNTER — Ambulatory Visit (INDEPENDENT_AMBULATORY_CARE_PROVIDER_SITE_OTHER): Payer: Medicaid Other

## 2021-06-13 ENCOUNTER — Encounter: Payer: Self-pay | Admitting: Physician Assistant

## 2021-06-13 ENCOUNTER — Ambulatory Visit (INDEPENDENT_AMBULATORY_CARE_PROVIDER_SITE_OTHER): Payer: Medicaid Other | Admitting: Sports Medicine

## 2021-06-13 ENCOUNTER — Other Ambulatory Visit: Payer: Self-pay

## 2021-06-13 DIAGNOSIS — M24412 Recurrent dislocation, left shoulder: Secondary | ICD-10-CM

## 2021-06-13 IMAGING — MR MR SHOULDER*L* W/ CM
4 of 5 series · 30 of 40 positions shown · IV contrast (agent unspecified)
Comparison: Left shoulder radiograph [DATE]

CLINICAL DATA: Insert

EXAM:
MR ARTHROGRAM OF THE left SHOULDER
TECHNIQUE: Multiplanar, multisequence MR imaging of the left shoulder was
performed following the administration of intra-articular contrast.
CONTRAST:  See Injection Documentation.

[Series 3: T1 fat-sat · axial · 4.0mm · 0.55mm/px · z∈[-18,+73]mm · 8 of 20 slices shown (1 of 2)]
[im 1/20]
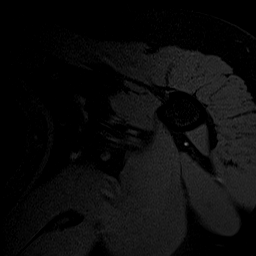
[im 3/20]
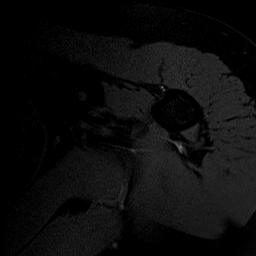
[im 6/20]
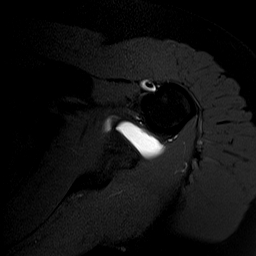
[im 9/20]
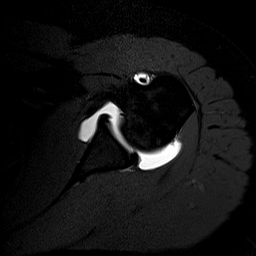
[im 11/20]
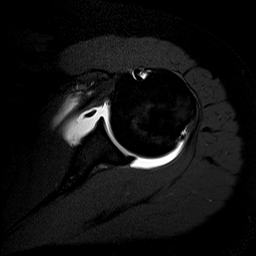
[im 14/20]
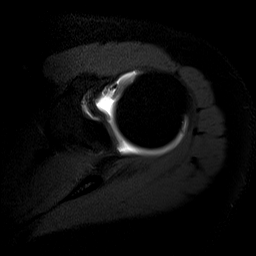
[im 17/20]
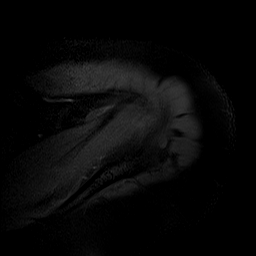
[im 20/20]
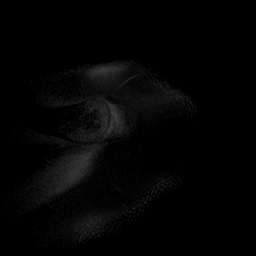

[Series 4: T1 fat-sat · oblique · 4.0mm · 0.27mm/px · 6 of 20 slices shown (2 of 2)]
[im 1/20]
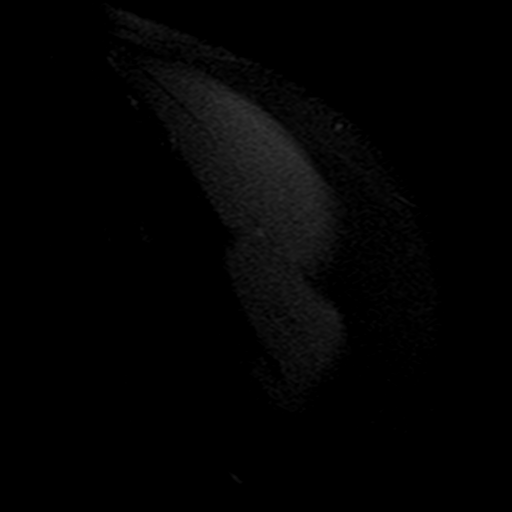
[im 3/20]
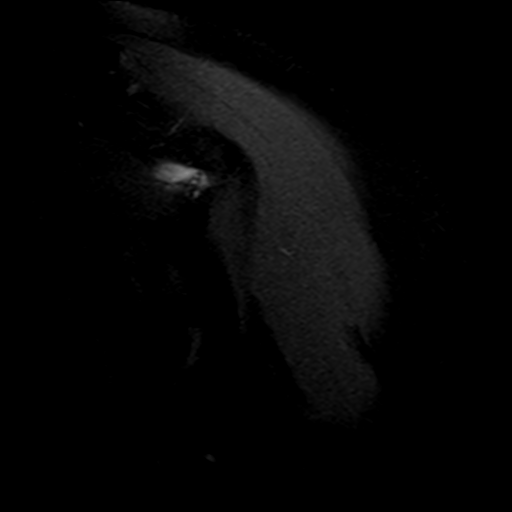
[im 6/20]
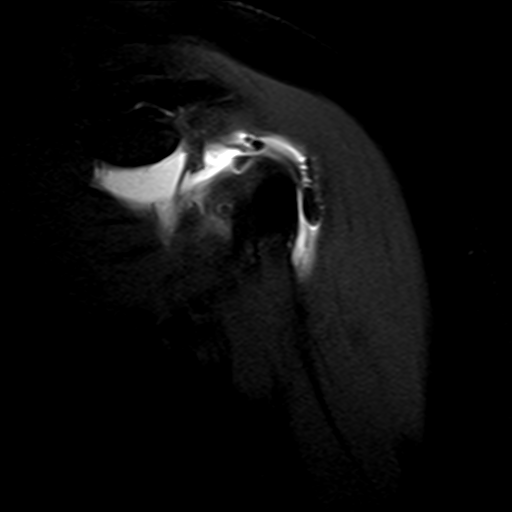
[im 9/20]
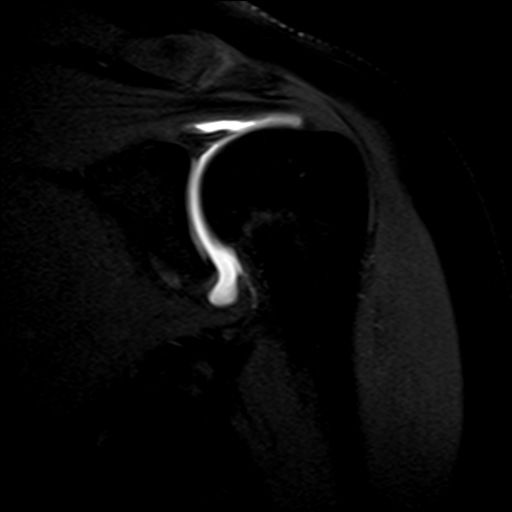
[im 11/20]
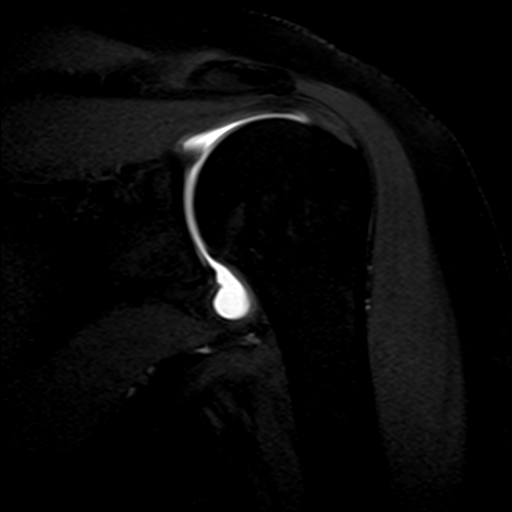
[im 17/20]
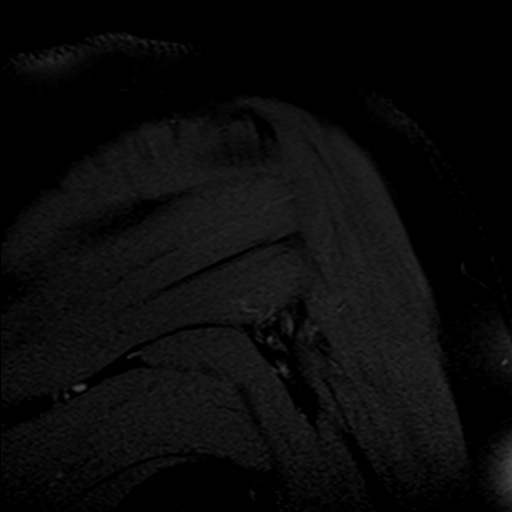

[Series 5: T2 fat-sat · oblique · 4.0mm · 0.27mm/px · 8 of 20 slices shown (1 of 2)]
[im 1/20]
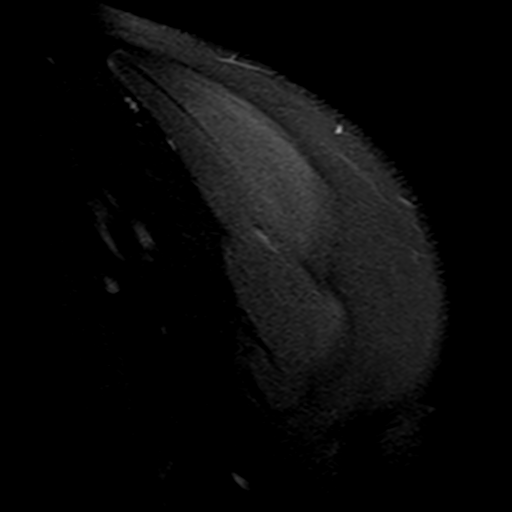
[im 3/20]
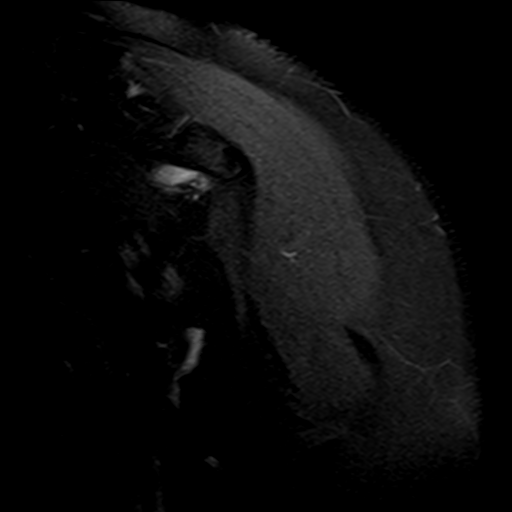
[im 6/20]
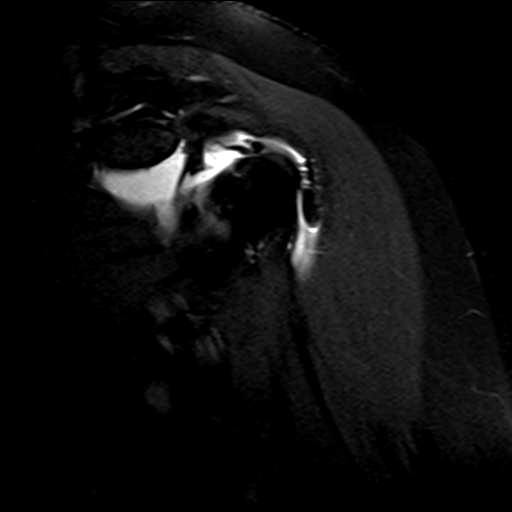
[im 9/20]
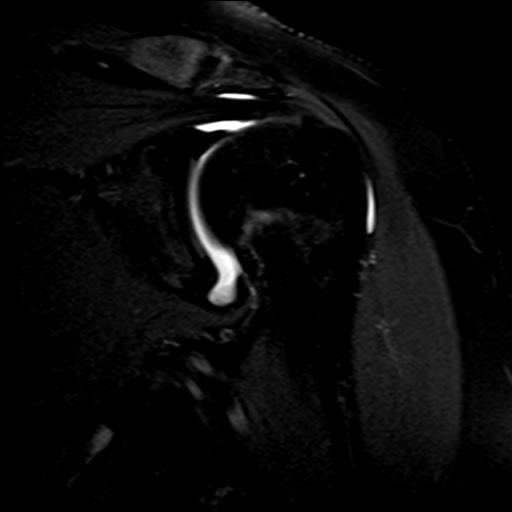
[im 11/20]
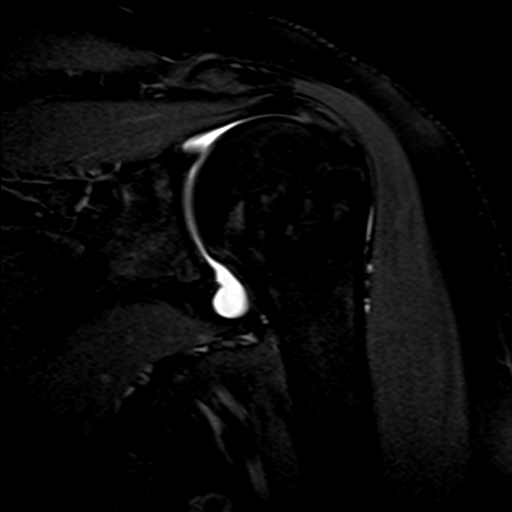
[im 14/20]
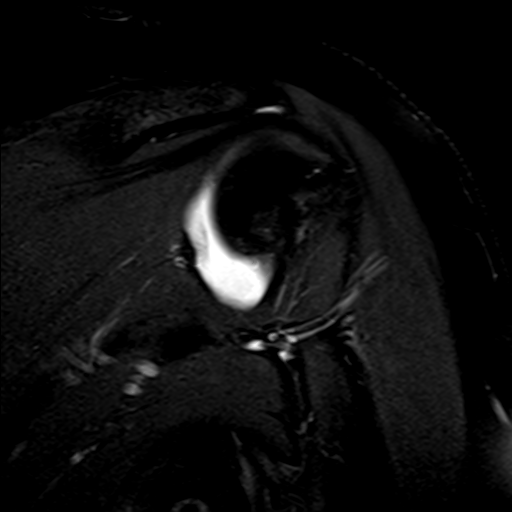
[im 17/20]
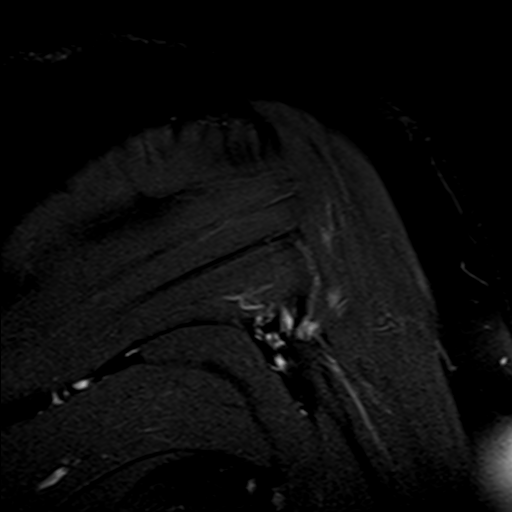
[im 20/20]
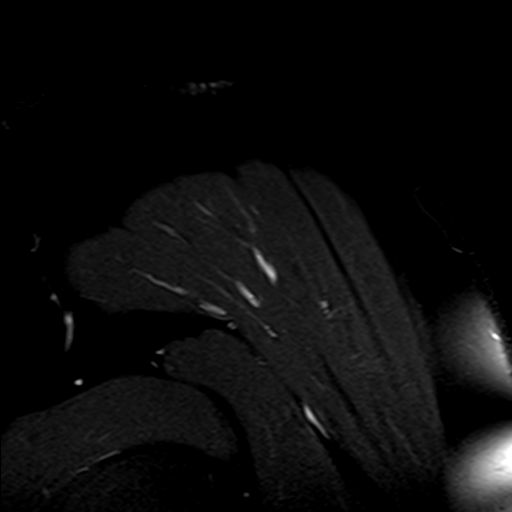

[Series 7: T2 fat-sat · sagittal · 4.0mm · 0.55mm/px · 8 of 20 slices shown (2 of 2)]
[im 1/20]
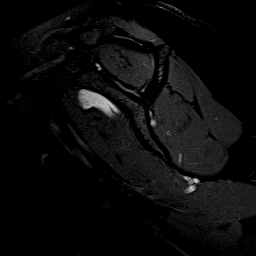
[im 3/20]
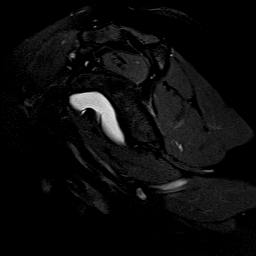
[im 6/20]
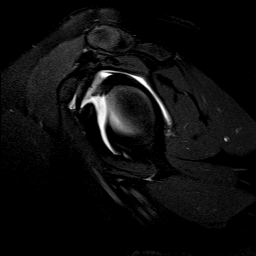
[im 9/20]
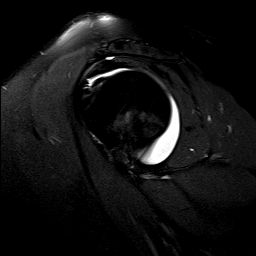
[im 11/20]
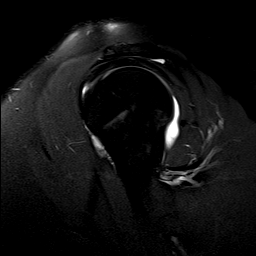
[im 14/20]
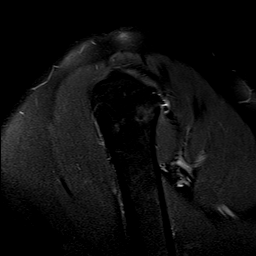
[im 17/20]
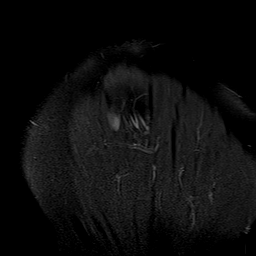
[im 20/20]
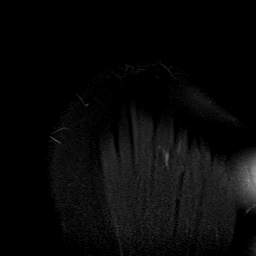

[30 of 40 positions shown; findings below may reference images not displayed]

FINDINGS: Rotator cuff: Supraspinatus tendon is intact. Infraspinatus tendon
is intact. Teres minor tendon is intact. Subscapularis tendon is
intact.

Muscles: No muscle atrophy or edema. No intramuscular fluid
collection or hematoma.

Biceps Long Head: Intraarticular and extraarticular portions of the
biceps tendon are intact.

Acromioclavicular Joint: No significant arthropathy of the
acromioclavicular joint. No subacromial/subdeltoid bursal fluid.

Glenohumeral Joint: Adequatedistension of the glenohumeral joint.

Labrum: Blunted appearance of the anterior inferior labrum
compatible with prior soft tissue EDIS ALMA injury.

Bones: There is a possible small Hill-Sachs deformity of the
posterolateral humeral head measuring 1.2 cm. This is non engaging.
There is no evidence of bony Bankart.

Other: No fluid collection or hematoma.
IMPRESSION: Small non engaging Hill-Sachs deformity of the posterolateral
humeral head. Blunted appearance of the anterior inferior labrum
compatible with prior soft tissue EDIS ALMA injury. No displaced
labral tear. No bony Bankart injury evident.

## 2021-06-13 MED ORDER — HYDROCODONE-ACETAMINOPHEN 5-325 MG PO TABS: 1.0000 | ORAL_TABLET | Freq: Three times a day (TID) | ORAL | 0 refills | Status: DC | PRN
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
## 2021-06-14 ENCOUNTER — Encounter (INDEPENDENT_AMBULATORY_CARE_PROVIDER_SITE_OTHER): Payer: Medicaid Other

## 2021-06-14 DIAGNOSIS — M24412 Recurrent dislocation, left shoulder: Secondary | ICD-10-CM | POA: Diagnosis not present

## 2021-06-15 NOTE — Telephone Encounter (Signed)
I spent 5 total minutes of online digital evaluation and management services. 

## 2021-06-23 ENCOUNTER — Other Ambulatory Visit: Payer: Self-pay

## 2021-06-23 ENCOUNTER — Encounter: Payer: Self-pay | Admitting: Orthopaedic Surgery

## 2021-06-23 ENCOUNTER — Ambulatory Visit (INDEPENDENT_AMBULATORY_CARE_PROVIDER_SITE_OTHER): Payer: Medicaid Other | Admitting: Orthopaedic Surgery

## 2021-06-23 VITALS — Ht 69.0 in | Wt 217.0 lb

## 2021-06-23 DIAGNOSIS — M24412 Recurrent dislocation, left shoulder: Secondary | ICD-10-CM | POA: Diagnosis not present
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
## 2021-07-01 ENCOUNTER — Telehealth (INDEPENDENT_AMBULATORY_CARE_PROVIDER_SITE_OTHER): Payer: Medicaid Other | Admitting: Medical-Surgical

## 2021-07-01 ENCOUNTER — Encounter: Payer: Self-pay | Admitting: Medical-Surgical

## 2021-07-01 DIAGNOSIS — Z91030 Bee allergy status: Secondary | ICD-10-CM

## 2021-07-01 DIAGNOSIS — T782XXS Anaphylactic shock, unspecified, sequela: Secondary | ICD-10-CM

## 2021-07-01 MED ORDER — EPINEPHRINE 0.3 MG/0.3ML IJ SOAJ: INTRAMUSCULAR | 11 refills | Status: DC

## 2021-07-01 NOTE — Progress Notes (Signed)
Called @ 9:55, no answer Direct video links sent

## 2021-07-01 NOTE — Progress Notes (Signed)
Virtual Visit via Video Note  I connected with Tonya Hart on 07/01/21 at  9:50 AM EDT by a video enabled telemedicine application and verified that I am speaking with the correct person using two identifiers.   I discussed the limitations of evaluation and management by telemedicine and the availability of in person appointments. The patient expressed understanding and agreed to proceed.  Patient location: home Provider locations: office  Subjective:    CC: EpiPen refill  HPI: Pleasant 30 year old female presenting via MyChart video visit to request to use any recently but no aspired and she would like to be prepared.  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  Notes that she has quite a few issues with bees and stinging insects in her yard and this makes her very nervous.  Past medical history, Surgical history, Family history not pertinant except as noted below, Social history, Allergies, and medications have been entered into the medical record, reviewed, and corrections made.   Review of Systems: See HPI for pertinent positives and negatives.   Objective:    General: Speaking clearly in complete sentences without any shortness of breath.  Alert and oriented x3.  Normal judgment. No apparent acute distress.  Impression and Recommendations:    1. Anaphylaxis, sequela Continue EpiPen as needed.  Refill sent to the pharmacy for 2 pens now with 11 refills so that she has plenty on hand.  I discussed the assessment and treatment plan with the patient. The patient was provided an opportunity to ask questions and all were answered. The patient agreed with the plan and demonstrated an understanding of the instructions.   The patient was advised to call back or seek an in-person evaluation if the symptoms worsen or if the condition fails to improve as anticipated.  10 minutes of non-face-to-face time was provided during this  encounter.  Return for In person visit on Monday as scheduled.  Thayer Ohm, DNP, APRN, FNP-BC [facility] and Sports Medicine

## 2021-07-03 NOTE — Progress Notes (Signed)
Patient no-showed today's appointment.  Thayer Ohm, DNP, APRN, FNP-BC [facility] and Sports Medicine

## 2021-07-03 NOTE — Patient Instructions (Signed)
Erroneous encounter

## 2021-07-04 ENCOUNTER — Other Ambulatory Visit: Payer: Self-pay

## 2021-07-04 ENCOUNTER — Ambulatory Visit (INDEPENDENT_AMBULATORY_CARE_PROVIDER_SITE_OTHER): Payer: Medicaid Other | Admitting: Orthopedic Surgery

## 2021-07-04 ENCOUNTER — Encounter: Payer: Self-pay | Admitting: Orthopedic Surgery

## 2021-07-04 ENCOUNTER — Ambulatory Visit: Payer: Self-pay | Admitting: Medical-Surgical

## 2021-07-04 DIAGNOSIS — M24412 Recurrent dislocation, left shoulder: Secondary | ICD-10-CM

## 2021-07-04 DIAGNOSIS — Z0000 Encounter for general adult medical examination without abnormal findings: Secondary | ICD-10-CM

## 2021-07-04 DIAGNOSIS — Z5329 Procedure and treatment not carried out because of patient's decision for other reasons: Secondary | ICD-10-CM

## 2021-07-07 ENCOUNTER — Encounter: Payer: Self-pay | Admitting: Orthopedic Surgery

## 2021-07-08 ENCOUNTER — Encounter: Payer: Self-pay | Admitting: Orthopedic Surgery

## 2021-07-08 NOTE — Progress Notes (Signed)
Office Visit Note   Patient: Tonya Hart           Date of Birth: 1991/09/23           MRN: [id number] Visit Date: 07/04/2021 Requested by: Tonya Butter, NP [address] [place],  [place] [address] PCP: Tonya Butter, NP  Subjective: Chief Complaint  Patient presents with   Left Shoulder - Follow-up    HPI: Tonya Hart is a 30 year old patient with left shoulder pain and recurrent instability.  Last dislocation 1-1/2 weeks ago.  She takes hydrocodone for pain management.  At age 61 she had her initial dislocation and she has had significant instability since that time.  She is right-hand dominant.  She states that in addition to frank dislocation she has a lot of subluxation anteriorly.  Currently she is out of work.  She is a stay-at-home mother.  Her mother and father are with her at home until she is able to have surgery and resolve this symptomatic instability problem with the shoulder.  She does not do too much in terms of overhead physical activity outside of activities of daily living.  MRI scan does show chronic small not engaging Hill-Sachs lesion along with blunted appearance of the anterior inferior labrum.  No haggle lesion is present.              ROS: All systems reviewed are negative as they relate to the chief complaint within the history of present illness.  Patient denies  fevers or chills.   Assessment & Plan: Visit Diagnoses:  1. Recurrent anterior dislocation of shoulder, left     Plan: Impression is recurrent instability left shoulder with multiple dislocations.  Patient does have abnormal appearing anterior inferior labrum.  No humeral avulsion of the glenohumeral ligaments is present.  Plan at this time is arthroscopy with labral repair.  Risk and benefits of the procedure discussed with the patient including but not limited to infection nerve vessel damage some loss of motion recurrent instability as well as a 6 to 8-week rehabilitation.  Which will  require diminished use of that left arm.  Patient does have parents here to help her with her newborn child.  Patient does vape but does not smoke.  She is counseled that these things can affect soft tissue healing.  All questions answered  Follow-Up Instructions: No follow-ups on file.   Orders:  No orders of the defined types were placed in this encounter.  No orders of the defined types were placed in this encounter.     Procedures: No procedures performed   Clinical Data: No additional findings.  Objective: Vital Signs: There were no vitals taken for this visit.  Physical Exam:   Constitutional: Patient appears well-developed HEENT:  Head: Normocephalic Eyes:EOM are normal Neck: Normal range of motion Cardiovascular: Normal rate Pulmonary/chest: Effort normal Neurologic: Patient is alert Skin: Skin is warm Psychiatric: Patient has normal mood and affect   Ortho Exam: Ortho exam demonstrates functional deltoid strength on the left with 5 out of 5 grip EPL FPL interosseous wrist flexion wrist extension bicep triceps and deltoid strength.  Radial pulse intact.  Deltoid fires.  Rotator cuff strength is good infraspinatus supraspinatus and subscap muscle testing.  She has passive range of motion with some apprehension on the left of 70/110/175.  Negative O'Brien's testing.  Sulcus sign symmetric slightly less than 1 cm which does not persist with external rotation.  Specialty Comments:  No specialty comments available.  Imaging: No results found.   PMFS History: Patient Active Problem List   Diagnosis Date Noted   Recurrent anterior dislocation of shoulder, left 05/13/2021   Mastitis 04/27/2021   Dysmenorrhea 05/18/2020   Depression with anxiety 04/04/2020   Anxiety state 04/04/2020   Cigarette nicotine dependence without complication 01/04/2018   Past Medical History:  Diagnosis Date   Anxiety and depression    no current medications   Asthma     Endometriosis    Environmental and seasonal allergies    PCOS (polycystic ovarian syndrome)    Seasonal allergies     Family History  Problem Relation Age of Onset   Thyroid disease Mother    Hyperlipidemia Mother    Hypertension Mother    Cataracts Mother    Hyperlipidemia Father    COPD Father    Heart attack Father    Hypertension Father    Diabetes Father    Prostate cancer Maternal Grandfather     Past Surgical History:  Procedure Laterality Date   TONSILLECTOMY     TONSILLECTOMY AND ADENOIDECTOMY     Social History   Occupational History   Occupation: [employer]    Associate Professor: [employer]  Tobacco Use   Smoking status: Former    Types: Cigarettes   Smokeless tobacco: Never  Vaping Use   Vaping Use: Former   Quit date: 08/04/2020   Substances: Nicotine  Substance and Sexual Activity   Alcohol use: Not Currently    Comment: 3 drinks/week, beer or liquor   Drug use: Yes    Frequency: 7.0 times per week    Types: Marijuana   Sexual activity: Yes    Partners: Male    Birth control/protection: Pill

## 2021-07-18 ENCOUNTER — Encounter: Payer: Self-pay | Admitting: Orthopedic Surgery

## 2021-07-18 ENCOUNTER — Other Ambulatory Visit: Payer: Self-pay | Admitting: Surgical

## 2021-07-18 MED ORDER — TRAMADOL HCL 50 MG PO TABS: 50.0000 mg | ORAL_TABLET | Freq: Three times a day (TID) | ORAL | 0 refills | Status: DC | PRN

## 2021-07-27 ENCOUNTER — Other Ambulatory Visit: Payer: Self-pay

## 2021-08-01 ENCOUNTER — Telehealth: Payer: Self-pay

## 2021-08-01 NOTE — Telephone Encounter (Signed)
Medication: HYDROcodone-acetaminophen (NORCO/VICODIN) 5-325 MG tablet  Prior authorization submitted via CoverMyMeds on 08/01/2021 [place] submission pending

## 2021-08-01 NOTE — Progress Notes (Signed)
Called Dr. Diamantina Providence office for pre admission testing orders. Left message on Cheryl Bennet's phone line.

## 2021-08-01 NOTE — Progress Notes (Signed)
Surgical Instructions    Your procedure is scheduled on Thursday September 22nd.  Report to [facility] Main Entrance "A" at 1030 A.M., then check in with the Admitting office.  Call this number if you have problems the morning of surgery:  [phone]   If you have any questions prior to your surgery date call [phone]: Open Monday-Friday 8am-4pm    Remember:  Do not eat after midnight the night before your surgery  You may drink clear liquids until 9:30am the morning of your surgery.   Clear liquids allowed are: Water, Non-Citrus Juices (without pulp), Carbonated Beverages, Clear Tea, Black Coffee ONLY (NO MILK, CREAM OR POWDERED CREAMER of any kind), and Gatorade    Take these medicines the morning of surgery with A SIP OF WATER busPIRone (BUSPAR) 10 MG tablet escitalopram (LEXAPRO) 20 MG tablet norethindrone (ORTHO MICRONOR) 0.35 MG tablet  IF NEEDED cyclobenzaprine (FLEXERIL) 10 MG tablet EPINEPHrine 0.3 mg/0.3 mL IJ SOAJ injection  As of today, STOP taking any Aspirin (unless otherwise instructed by your surgeon) Aleve, Naproxen, Ibuprofen, Motrin, Advil, Goody's, BC's, all herbal medications, fish oil, and all vitamins.          Do not wear jewelry or makeup Do not wear lotions, powders, perfumes, or deodorant. Do not shave 48 hours prior to surgery.   Do not bring valuables to the hospital. DO Not wear nail polish, gel polish, artificial nails, or any other type of covering on natural nails including finger and toenails. If patients have artificial nails, gel coating, etc. that need to be removed by a nail salon please have this removed prior to surgery or surgery may need to be canceled/delayed if the surgeon/ anesthesia feels like the patient is unable to be adequately monitored.             [facility] is not responsible for any belongings or valuables.  Do NOT Smoke (Tobacco/Vaping)  24 hours prior to your procedure If you use a CPAP at night, you may bring your  mask for your overnight stay.   Contacts, glasses, dentures or bridgework may not be worn into surgery, please bring cases for these belongings   For patients admitted to the hospital, discharge time will be determined by your treatment team.   Patients discharged the day of surgery will not be allowed to drive home, and someone needs to stay with them for 24 hours.  ONLY 1 SUPPORT PERSON MAY BE PRESENT WHILE YOU ARE IN SURGERY. IF YOU ARE TO BE ADMITTED ONCE YOU ARE IN YOUR ROOM YOU WILL BE ALLOWED TWO (2) VISITORS.  Minor children may have two parents present. Special consideration for safety and communication needs will be reviewed on a case by case basis.  Special instructions:    Oral Hygiene is also important to reduce your risk of infection.  Remember - BRUSH YOUR TEETH THE MORNING OF SURGERY WITH YOUR REGULAR TOOTHPASTE   [facility]- Preparing For Surgery  Before surgery, you can play an important role. Because skin is not sterile, your skin needs to be as free of germs as possible. You can reduce the number of germs on your skin by washing with CHG (chlorahexidine gluconate) Soap before surgery.  CHG is an antiseptic cleaner which kills germs and bonds with the skin to continue killing germs even after washing.     Please do not use if you have an allergy to CHG or antibacterial soaps. If your skin becomes reddened/irritated stop using the CHG.  Do not shave (including legs and underarms) for at least 48 hours prior to first CHG shower. It is OK to shave your face.  Please follow these instructions carefully.     Shower the NIGHT BEFORE SURGERY and the MORNING OF SURGERY with CHG Soap.   If you chose to wash your hair, wash your hair first as usual with your normal shampoo. After you shampoo, rinse your hair and body thoroughly to remove the shampoo.  Then [employer] and genitals (private parts) with your normal soap and rinse thoroughly to remove soap.  After that Use CHG Soap  as you would any other liquid soap. You can apply CHG directly to the skin and wash gently with a scrungie or a clean washcloth.   Apply the CHG Soap to your body ONLY FROM THE NECK DOWN.  Do not use on open wounds or open sores. Avoid contact with your eyes, ears, mouth and genitals (private parts). Wash Face and genitals (private parts)  with your normal soap.   Wash thoroughly, paying special attention to the area where your surgery will be performed.  Thoroughly rinse your body with warm water from the neck down.  DO NOT shower/wash with your normal soap after using and rinsing off the CHG Soap.  Pat yourself dry with a CLEAN TOWEL.  Wear CLEAN PAJAMAS to bed the night before surgery  Place CLEAN SHEETS on your bed the night before your surgery  DO NOT SLEEP WITH PETS.   Day of Surgery:  Take a shower with CHG soap. Wear Clean/Comfortable clothing the morning of surgery Do not apply any deodorants/lotions.   Remember to brush your teeth WITH YOUR REGULAR TOOTHPASTE.   Please read over the following fact sheets that you were given.

## 2021-08-02 ENCOUNTER — Encounter (HOSPITAL_COMMUNITY): Payer: Self-pay | Admitting: Orthopedic Surgery

## 2021-08-02 ENCOUNTER — Encounter (HOSPITAL_COMMUNITY)
Admission: RE | Admit: 2021-08-02 | Discharge: 2021-08-02 | Disposition: A | Discharge status: Home / Self Care | Payer: Medicaid Other | Source: Ambulatory Visit | Attending: Orthopedic Surgery | Admitting: Orthopedic Surgery

## 2021-08-02 ENCOUNTER — Other Ambulatory Visit: Payer: Self-pay

## 2021-08-02 DIAGNOSIS — Z01812 Encounter for preprocedural laboratory examination: Secondary | ICD-10-CM | POA: Diagnosis not present

## 2021-08-02 LAB — CBC
HCT: 41.3 % (ref 36.0–46.0)
Hemoglobin: 13.8 g/dL (ref 12.0–15.0)
MCH: 31.7 pg (ref 26.0–34.0)
MCHC: 33.4 g/dL (ref 30.0–36.0)
MCV: 94.7 fL (ref 80.0–100.0)
Platelets: 237 10*3/uL (ref 150–400)
RBC: 4.36 MIL/uL (ref 3.87–5.11)
RDW: 13.2 % (ref 11.5–15.5)
WBC: 6 10*3/uL (ref 4.0–10.5)
nRBC: 0 % (ref 0.0–0.2)

## 2021-08-02 NOTE — Progress Notes (Signed)
PCP - Christen Butter NP Cardiologist - none  PPM/ICD - denies Device Orders -  Rep Notified -   Chest x-ray - none EKG - none Stress Test - none ECHO - none Cardiac Cath - none  Sleep Study - no CPAP -   Fasting Blood Sugar - n/a Checks Blood Sugar _____ times a day  Blood Thinner Instructions:n/a Aspirin Instructions:n/a  ERAS Protcol - clears until 0930 PRE-SURGERY Ensure or G2- no  COVID TEST- n/a- ambulatory surgery   Anesthesia review: no  Patient denies shortness of breath, fever, cough and chest pain at PAT appointment   All instructions explained to the patient, with a verbal understanding of the material. Patient agrees to go over the instructions while at home for a better understanding. Patient also instructed to self quarantine after being tested for COVID-19. The opportunity to ask questions was provided.

## 2021-08-02 NOTE — Progress Notes (Signed)
Left voicemail for Chrystine Oiler, surgery scheduler for Dr. August Saucer , informing her of pt's WBC 15.1 at today's PAT appointment. Also requested pre op orders.

## 2021-08-03 ENCOUNTER — Other Ambulatory Visit: Payer: Self-pay | Admitting: Family Medicine

## 2021-08-03 NOTE — Progress Notes (Signed)
Anesthesia Chart Review:  Case: [id number] Date/Time: 08/11/21 1221   Procedure: LEFT SHOULDER ARTHROSCOPY WITH LABRAL REPAIR (Left: Shoulder)   Anesthesia type: General   Pre-op diagnosis: left shoulder instability   Location: [facility] ROOM 06 / [facility] OR   Surgeons: Cammy Copa, MD       DISCUSSION: Patient is a 30 year old female scheduled for the above procedure. 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   History includes former smoker, asthma, PCOS, endometriosis, T&A. + Marijuana & vaping history. + Vaginal delivery of living female infant 04/01/21, had post-partum hypertension requiring nifedipine.   Surgeon orders pending at PAT RN visit. Preoperative CBC normal. Anesthesia team to evaluate on the day of surgery.   VS: BP 120/86   Pulse 100   Temp 36.8 C (Oral)   Resp 18   Ht 5\' 10"  (1.778 m)   Wt 95.3 kg   SpO2 100%   BMI 30.16 kg/m   PROVIDERS: , NP is listed as PCP   LABS: Labs reviewed: Acceptable for surgery. CBC normal. For urine pregnancy on the day of surgery.   IMAGES: MRI Left shoulder 06/13/21: IMPRESSION: Small non engaging Hill-Sachs deformity of the posterolateral humeral head. Blunted appearance of the anterior inferior labrum compatible with prior soft tissue Bankart injury. No displaced labral tear. No bony Bankart injury evident.    EKG: N/A   CV: N/A  Past Medical History:  Diagnosis Date   Anxiety and depression    no current medications   Asthma    Endometriosis    Environmental and seasonal allergies    PCOS (polycystic ovarian syndrome)    Seasonal allergies     Past Surgical History:  Procedure Laterality Date   TONSILLECTOMY     TONSILLECTOMY AND ADENOIDECTOMY      MEDICATIONS:  acidophilus (RISAQUAD) CAPS capsule   busPIRone (BUSPAR) 10 MG tablet   cyclobenzaprine (FLEXERIL) 10 MG tablet   EPINEPHrine 0.3 mg/0.3 mL IJ SOAJ injection    escitalopram (LEXAPRO) 20 MG tablet   FERROUS SULFATE PO   ibuprofen (ADVIL) 800 MG tablet   norethindrone (ORTHO MICRONOR) 0.35 MG tablet   Prenatal Vit-Fe Fumarate-FA (MULTIVITAMIN-PRENATAL) 27-0.8 MG TABS tablet   traMADol (ULTRAM) 50 MG tablet   No current facility-administered medications for this encounter.    06/15/21, PA-C Surgical Short Stay/Anesthesiology [facility] Phone [phone] [facility] Phone [phone] 08/03/2021 2:42 PM

## 2021-08-03 NOTE — Anesthesia Preprocedure Evaluation (Addendum)
Anesthesia Evaluation  Patient identified by MRN, date of birth, ID band Patient awake    Reviewed: Allergy & Precautions, NPO status , Patient's Chart, lab work & pertinent test results  Airway Mallampati: I  TM Distance: >3 FB Neck ROM: Full   Comment: Pierced left nostril Dental  (+) Chipped, Dental Advisory Given,    Pulmonary asthma , Current SmokerPatient did not abstain from smoking., former smoker,  "Vaped' this am   Pulmonary exam normal breath sounds clear to auscultation       Cardiovascular negative cardio ROS Normal cardiovascular exam Rhythm:Regular Rate:Normal     Neuro/Psych PSYCHIATRIC DISORDERS Anxiety Depression negative neurological ROS     GI/Hepatic negative GI ROS, Neg liver ROS,   Endo/Other  Obesity  Renal/GU negative Renal ROS  negative genitourinary   Musculoskeletal Torn labrum left shoulder   Abdominal (+) + obese,   Peds  Hematology negative hematology ROS (+)   Anesthesia Other Findings   Reproductive/Obstetrics                           Anesthesia Physical Anesthesia Plan  ASA: 2  Anesthesia Plan: General   Post-op Pain Management:    Induction: Intravenous  PONV Risk Score and Plan: 4 or greater and Treatment may vary due to age or medical condition, Ondansetron and Dexamethasone  Airway Management Planned: Oral ETT  Additional Equipment: None  Intra-op Plan:   Post-operative Plan: Extubation in OR  Informed Consent: I have reviewed the patients History and Physical, chart, labs and discussed the procedure including the risks, benefits and alternatives for the proposed anesthesia with the patient or authorized representative who has indicated his/her understanding and acceptance.     Dental advisory given  Plan Discussed with: CRNA and Anesthesiologist  Anesthesia Plan Comments: (PAT note written 08/03/2021 by Shonna Chock, PA-C. )        Anesthesia Quick Evaluation

## 2021-08-11 ENCOUNTER — Ambulatory Visit (HOSPITAL_COMMUNITY): Payer: Medicaid Other | Admitting: Vascular Surgery

## 2021-08-11 ENCOUNTER — Ambulatory Visit (HOSPITAL_COMMUNITY): Payer: Medicaid Other | Admitting: Anesthesiology

## 2021-08-11 ENCOUNTER — Encounter (HOSPITAL_COMMUNITY)
Admission: RE | Disposition: A | Discharge status: Home / Self Care | Payer: Self-pay | Source: Home / Self Care | Attending: Orthopedic Surgery

## 2021-08-11 ENCOUNTER — Encounter (HOSPITAL_COMMUNITY): Payer: Self-pay | Admitting: Orthopedic Surgery

## 2021-08-11 ENCOUNTER — Ambulatory Visit (HOSPITAL_COMMUNITY)
Admission: RE | Admit: 2021-08-11 | Discharge: 2021-08-11 | Disposition: A | Discharge status: Home / Self Care | Payer: Medicaid Other | Attending: Orthopedic Surgery | Admitting: Orthopedic Surgery

## 2021-08-11 ENCOUNTER — Other Ambulatory Visit: Payer: Self-pay

## 2021-08-11 DIAGNOSIS — Z79899 Other long term (current) drug therapy: Secondary | ICD-10-CM | POA: Insufficient documentation

## 2021-08-11 DIAGNOSIS — G8918 Other acute postprocedural pain: Secondary | ICD-10-CM | POA: Diagnosis not present

## 2021-08-11 DIAGNOSIS — M25312 Other instability, left shoulder: Secondary | ICD-10-CM | POA: Diagnosis not present

## 2021-08-11 DIAGNOSIS — S43432A Superior glenoid labrum lesion of left shoulder, initial encounter: Secondary | ICD-10-CM | POA: Diagnosis not present

## 2021-08-11 DIAGNOSIS — E282 Polycystic ovarian syndrome: Secondary | ICD-10-CM | POA: Diagnosis not present

## 2021-08-11 HISTORY — PX: SHOULDER ARTHROSCOPY WITH LABRAL REPAIR: SHX5691

## 2021-08-11 LAB — BASIC METABOLIC PANEL
Anion gap: 13 (ref 5–15)
BUN: 9 mg/dL (ref 6–20)
CO2: 20 mmol/L — ABNORMAL LOW (ref 22–32)
Calcium: 9.6 mg/dL (ref 8.9–10.3)
Chloride: 106 mmol/L (ref 98–111)
Creatinine, Ser: 0.73 mg/dL (ref 0.44–1.00)
GFR, Estimated: >60 mL/min (ref >60)
Glucose, Bld: 65 mg/dL — ABNORMAL LOW (ref 70–99)
Potassium: 3.5 mmol/L (ref 3.5–5.1)
Sodium: 139 mmol/L (ref 135–145)

## 2021-08-11 LAB — POCT PREGNANCY, URINE: Preg Test, Ur: NEGATIVE

## 2021-08-11 SURGERY — ARTHROSCOPY, SHOULDER, WITH GLENOID LABRUM REPAIR
Anesthesia: General | Site: Shoulder | Laterality: Left

## 2021-08-11 MED ORDER — OXYCODONE HCL 5 MG PO TABS
5.0000 mg | ORAL_TABLET | Freq: Once | ORAL | Status: DC | PRN
Start: 2021-08-11 — End: 2021-08-11

## 2021-08-11 MED ORDER — BUPIVACAINE LIPOSOME 1.3 % IJ SUSP
INTRAMUSCULAR | Status: DC | PRN
  Administered 2021-08-11: 10 mL via PERINEURAL

## 2021-08-11 MED ORDER — POVIDONE-IODINE 10 % EX SWAB
2.0000 "application " | Freq: Once | CUTANEOUS | Status: AC
  Administered 2021-08-11: 2 via TOPICAL

## 2021-08-11 MED ORDER — CHLORHEXIDINE GLUCONATE 0.12 % MT SOLN: 15.0000 mL | Freq: Once | OROMUCOSAL | Status: AC

## 2021-08-11 MED ORDER — EPINEPHRINE PF 1 MG/ML IJ SOLN
INTRAMUSCULAR | Status: AC
  Filled 2021-08-11: qty 4

## 2021-08-11 MED ORDER — SCOPOLAMINE 1 MG/3DAYS TD PT72
MEDICATED_PATCH | TRANSDERMAL | Status: AC
  Filled 2021-08-11: qty 1

## 2021-08-11 MED ORDER — LIDOCAINE 2% (20 MG/ML) 5 ML SYRINGE
INTRAMUSCULAR | Status: DC | PRN
  Administered 2021-08-11: 60 mg via INTRAVENOUS

## 2021-08-11 MED ORDER — GABAPENTIN 300 MG PO CAPS: 300.0000 mg | ORAL_CAPSULE | Freq: Three times a day (TID) | ORAL | 0 refills | Status: DC

## 2021-08-11 MED ORDER — MIDAZOLAM HCL 2 MG/2ML IJ SOLN
INTRAMUSCULAR | Status: AC
  Administered 2021-08-11: 2 mg via INTRAVENOUS
  Filled 2021-08-11: qty 2

## 2021-08-11 MED ORDER — PROPOFOL 10 MG/ML IV BOLUS
INTRAVENOUS | Status: DC | PRN
  Administered 2021-08-11: 160 mg via INTRAVENOUS

## 2021-08-11 MED ORDER — MIDAZOLAM HCL 2 MG/2ML IJ SOLN
INTRAMUSCULAR | Status: AC
  Filled 2021-08-11: qty 2

## 2021-08-11 MED ORDER — ONDANSETRON HCL 4 MG/2ML IJ SOLN
4.0000 mg | Freq: Once | INTRAMUSCULAR | Status: AC | PRN
  Administered 2021-08-11: 4 mg via INTRAVENOUS

## 2021-08-11 MED ORDER — MIDAZOLAM HCL 2 MG/2ML IJ SOLN: 2.0000 mg | Freq: Once | INTRAMUSCULAR | Status: AC

## 2021-08-11 MED ORDER — FENTANYL CITRATE (PF) 100 MCG/2ML IJ SOLN: 25.0000 ug | INTRAMUSCULAR | Status: DC | PRN

## 2021-08-11 MED ORDER — FENTANYL CITRATE (PF) 250 MCG/5ML IJ SOLN
INTRAMUSCULAR | Status: AC
  Filled 2021-08-11: qty 5

## 2021-08-11 MED ORDER — MORPHINE SULFATE (PF) 4 MG/ML IV SOLN
INTRAVENOUS | Status: AC
  Filled 2021-08-11: qty 2

## 2021-08-11 MED ORDER — PHENYLEPHRINE 40 MCG/ML (10ML) SYRINGE FOR IV PUSH (FOR BLOOD PRESSURE SUPPORT)
PREFILLED_SYRINGE | INTRAVENOUS | Status: AC
  Filled 2021-08-11: qty 10

## 2021-08-11 MED ORDER — ROCURONIUM BROMIDE 10 MG/ML (PF) SYRINGE
PREFILLED_SYRINGE | INTRAVENOUS | Status: DC | PRN
  Administered 2021-08-11: 60 mg via INTRAVENOUS

## 2021-08-11 MED ORDER — CEFAZOLIN SODIUM-DEXTROSE 2-4 GM/100ML-% IV SOLN
2.0000 g | INTRAVENOUS | Status: AC
  Administered 2021-08-11: 2 g via INTRAVENOUS

## 2021-08-11 MED ORDER — PHENYLEPHRINE HCL-NACL 20-0.9 MG/250ML-% IV SOLN
INTRAVENOUS | Status: DC | PRN
  Administered 2021-08-11: 50 ug/min via INTRAVENOUS

## 2021-08-11 MED ORDER — CHLORHEXIDINE GLUCONATE 0.12 % MT SOLN
OROMUCOSAL | Status: AC
  Administered 2021-08-11: 15 mL via OROMUCOSAL
  Filled 2021-08-11: qty 15

## 2021-08-11 MED ORDER — CEFAZOLIN SODIUM-DEXTROSE 2-4 GM/100ML-% IV SOLN
INTRAVENOUS | Status: AC
  Filled 2021-08-11: qty 100

## 2021-08-11 MED ORDER — AMISULPRIDE (ANTIEMETIC) 5 MG/2ML IV SOLN
10.0000 mg | Freq: Once | INTRAVENOUS | Status: AC
  Administered 2021-08-11: 10 mg via INTRAVENOUS

## 2021-08-11 MED ORDER — SUGAMMADEX SODIUM 200 MG/2ML IV SOLN
INTRAVENOUS | Status: DC | PRN
Start: 2021-08-11 — End: 2021-08-11
  Administered 2021-08-11: 200 mg via INTRAVENOUS

## 2021-08-11 MED ORDER — MIDAZOLAM HCL 2 MG/2ML IJ SOLN
INTRAMUSCULAR | Status: DC | PRN
Start: 2021-08-11 — End: 2021-08-11
  Administered 2021-08-11: 2 mg via INTRAVENOUS

## 2021-08-11 MED ORDER — CLONIDINE HCL (ANALGESIA) 100 MCG/ML EP SOLN
EPIDURAL | Status: AC
  Filled 2021-08-11: qty 10

## 2021-08-11 MED ORDER — PHENYLEPHRINE 40 MCG/ML (10ML) SYRINGE FOR IV PUSH (FOR BLOOD PRESSURE SUPPORT)
PREFILLED_SYRINGE | INTRAVENOUS | Status: DC | PRN
  Administered 2021-08-11 (×4): 80 ug via INTRAVENOUS
  Administered 2021-08-11 (×2): 40 ug via INTRAVENOUS

## 2021-08-11 MED ORDER — FENTANYL CITRATE (PF) 100 MCG/2ML IJ SOLN
INTRAMUSCULAR | Status: AC
  Administered 2021-08-11: 100 ug via INTRAVENOUS
  Filled 2021-08-11: qty 2

## 2021-08-11 MED ORDER — AMISULPRIDE (ANTIEMETIC) 5 MG/2ML IV SOLN
INTRAVENOUS | Status: AC
  Filled 2021-08-11: qty 4

## 2021-08-11 MED ORDER — PROPOFOL 10 MG/ML IV BOLUS
INTRAVENOUS | Status: AC
  Filled 2021-08-11: qty 20

## 2021-08-11 MED ORDER — SODIUM CHLORIDE 0.9 % IR SOLN
Status: DC | PRN
  Administered 2021-08-11 (×2): 6000 mL
  Administered 2021-08-11 (×2): 3000 mL

## 2021-08-11 MED ORDER — ORAL CARE MOUTH RINSE: 15.0000 mL | Freq: Once | OROMUCOSAL | Status: AC

## 2021-08-11 MED ORDER — ONDANSETRON HCL 4 MG/2ML IJ SOLN
INTRAMUSCULAR | Status: DC | PRN
  Administered 2021-08-11: 4 mg via INTRAVENOUS

## 2021-08-11 MED ORDER — POVIDONE-IODINE 7.5 % EX SOLN: Freq: Once | CUTANEOUS | Status: DC

## 2021-08-11 MED ORDER — LACTATED RINGERS IV SOLN: INTRAVENOUS | Status: DC

## 2021-08-11 MED ORDER — FENTANYL CITRATE (PF) 100 MCG/2ML IJ SOLN: 100.0000 ug | Freq: Once | INTRAMUSCULAR | Status: AC

## 2021-08-11 MED ORDER — BUPIVACAINE HCL (PF) 0.25 % IJ SOLN
INTRAMUSCULAR | Status: AC
  Filled 2021-08-11: qty 20

## 2021-08-11 MED ORDER — ONDANSETRON HCL 4 MG/2ML IJ SOLN
INTRAMUSCULAR | Status: AC
  Filled 2021-08-11: qty 2

## 2021-08-11 MED ORDER — FENTANYL CITRATE (PF) 250 MCG/5ML IJ SOLN
INTRAMUSCULAR | Status: DC | PRN
  Administered 2021-08-11: 100 ug via INTRAVENOUS

## 2021-08-11 MED ORDER — OXYCODONE HCL 5 MG/5ML PO SOLN: 5.0000 mg | Freq: Once | ORAL | Status: DC | PRN

## 2021-08-11 MED ORDER — DEXAMETHASONE SODIUM PHOSPHATE 10 MG/ML IJ SOLN
INTRAMUSCULAR | Status: DC | PRN
Start: 2021-08-11 — End: 2021-08-11
  Administered 2021-08-11: 10 mg via INTRAVENOUS

## 2021-08-11 MED ORDER — OXYCODONE-ACETAMINOPHEN 5-325 MG PO TABS: 1.0000 | ORAL_TABLET | ORAL | 0 refills | Status: DC | PRN

## 2021-08-11 MED ORDER — BUPIVACAINE-EPINEPHRINE (PF) 0.5% -1:200000 IJ SOLN
INTRAMUSCULAR | Status: DC | PRN
  Administered 2021-08-11: 20 mL via PERINEURAL

## 2021-08-11 SURGICAL SUPPLY — 54 items
ANCHOR SUT 1.8 FIBERTAK SB KL (Anchor) ×10 IMPLANT
BAG COUNTER SPONGE SURGICOUNT (BAG) ×2 IMPLANT
BLADE CUTTER GATOR 3.5 (BLADE) IMPLANT
BLADE EXCALIBUR 4.0X13 (MISCELLANEOUS) IMPLANT
BLADE SURG 11 STRL SS (BLADE) IMPLANT
CANNULA 5.75X71 LONG (CANNULA) ×2 IMPLANT
CANNULA TWIST IN 8.25X7CM (CANNULA) ×4 IMPLANT
COVER SURGICAL LIGHT HANDLE (MISCELLANEOUS) ×2 IMPLANT
DISSECTOR 4.0MM X 13CM (MISCELLANEOUS) ×2 IMPLANT
DRAPE INCISE IOBAN 66X45 STRL (DRAPES) ×2 IMPLANT
DRAPE STERI 35X30 U-POUCH (DRAPES) ×4 IMPLANT
DRSG TEGADERM 2-3/8X2-3/4 SM (GAUZE/BANDAGES/DRESSINGS) ×6 IMPLANT
DRSG TEGADERM 4X4.75 (GAUZE/BANDAGES/DRESSINGS) IMPLANT
DRSG XEROFORM 1X8 (GAUZE/BANDAGES/DRESSINGS) ×2 IMPLANT
DURAPREP 26ML APPLICATOR (WOUND CARE) ×4 IMPLANT
DW OUTFLOW CASSETTE/TUBE SET (MISCELLANEOUS) ×2 IMPLANT
ELECT REM PT RETURN 9FT ADLT (ELECTROSURGICAL) ×2
ELECTRODE REM PT RTRN 9FT ADLT (ELECTROSURGICAL) ×1 IMPLANT
FIBERSTICK 2 (SUTURE) IMPLANT
GAUZE SPONGE 4X4 12PLY STRL (GAUZE/BANDAGES/DRESSINGS) ×2 IMPLANT
GAUZE XEROFORM 1X8 LF (GAUZE/BANDAGES/DRESSINGS) ×2 IMPLANT
GLOVE SRG 8 PF TXTR STRL LF DI (GLOVE) ×1 IMPLANT
GLOVE SURG LTX SZ8 (GLOVE) ×2 IMPLANT
GLOVE SURG UNDER POLY LF SZ8 (GLOVE) ×1
GOWN STRL REUS W/ TWL LRG LVL3 (GOWN DISPOSABLE) ×3 IMPLANT
GOWN STRL REUS W/TWL LRG LVL3 (GOWN DISPOSABLE) ×3
KIT BASIN OR (CUSTOM PROCEDURE TRAY) ×2 IMPLANT
KIT CVD SPEAR FBRTK 1.8 DRILL (KITS) ×4 IMPLANT
KIT STR SPEAR 1.8 FBRTK DISP (KITS) ×2 IMPLANT
KIT TURNOVER KIT B (KITS) ×2 IMPLANT
LASSO 90 CVE QUICKPAS (DISPOSABLE) IMPLANT
LASSO CRESCENT QUICKPASS (SUTURE) IMPLANT
MANIFOLD NEPTUNE II (INSTRUMENTS) ×2 IMPLANT
NEEDLE SPNL 18GX3.5 QUINCKE PK (NEEDLE) ×2 IMPLANT
NEEDLE SUT 2-0 SCORPION KNEE (NEEDLE) ×2 IMPLANT
NS IRRIG 1000ML POUR BTL (IV SOLUTION) ×2 IMPLANT
PACK SHOULDER (CUSTOM PROCEDURE TRAY) ×2 IMPLANT
PAD ARMBOARD 7.5X6 YLW CONV (MISCELLANEOUS) ×4 IMPLANT
PORT APPOLLO RF 90DEGREE MULTI (SURGICAL WAND) ×2 IMPLANT
SLING ARM IMMOBILIZER MED (SOFTGOODS) IMPLANT
SLING ARM IMMOBILIZER XL (CAST SUPPLIES) ×2 IMPLANT
SPONGE T-LAP 4X18 ~~LOC~~+RFID (SPONGE) ×2 IMPLANT
SUT ETHILON 3 0 PS 1 (SUTURE) ×4 IMPLANT
SUT FIBERWIRE 2-0 18 17.9 3/8 (SUTURE)
SUT VIC AB 3-0 X1 27 (SUTURE) ×2 IMPLANT
SUTURE FIBERWR 2-0 18 17.9 3/8 (SUTURE) IMPLANT
SYR 20ML LL LF (SYRINGE) ×2 IMPLANT
SYR 30ML LL (SYRINGE) ×2 IMPLANT
TAPE LABRALWHITE 1.5X36 (TAPE) IMPLANT
TAPE SUT LABRALTAP WHT/BLK (SUTURE) IMPLANT
TOWEL GREEN STERILE (TOWEL DISPOSABLE) ×2 IMPLANT
TOWEL GREEN STERILE FF (TOWEL DISPOSABLE) ×2 IMPLANT
TUBING ARTHROSCOPY IRRIG 16FT (MISCELLANEOUS) ×2 IMPLANT
WATER STERILE IRR 1000ML POUR (IV SOLUTION) ×2 IMPLANT

## 2021-08-11 NOTE — Anesthesia Postprocedure Evaluation (Signed)
Anesthesia Post Note  Patient: Tonya Hart  Procedure(s) Performed: LEFT SHOULDER ARTHROSCOPY WITH LABRAL REPAIR (Left: Shoulder)     Patient location during evaluation: PACU Anesthesia Type: General Level of consciousness: awake and alert and oriented Pain management: pain level controlled Vital Signs Assessment: post-procedure vital signs reviewed and stable Respiratory status: spontaneous breathing, nonlabored ventilation and respiratory function stable Cardiovascular status: blood pressure returned to baseline and stable Anesthetic complications: no Comments: Given Amelsupride in PACU for .PONV.   No notable events documented.  Last Vitals:  Vitals:   08/11/21 1600 08/11/21 1615  BP:  (!) 124/91  Pulse: 92 85  Resp: 13 14  Temp:    SpO2: 98% 100%    Last Pain:  Vitals:   08/11/21 1615  TempSrc:   PainSc: 0-No pain                 Breahna Boylen A.

## 2021-08-11 NOTE — H&P (Signed)
Tonya Hart is an 30 y.o. female.   Chief Complaint: left shoulder instability HPI:  Tonya Hart is a 30 year old patient with left shoulder pain and recurrent instability.  Last dislocation 1-1/2 weeks ago.  She takes hydrocodone for pain management.  At age 80 she had her initial dislocation and she has had significant instability since that time.  She is right-hand dominant.  She states that in addition to frank dislocation she has a lot of subluxation anteriorly.  Currently she is out of work.  She is a stay-at-home mother.  Her mother and father are with her at home until she is able to have surgery and resolve this symptomatic instability problem with the shoulder.  She does not do too much in terms of overhead physical activity outside of activities of daily living.  MRI scan does show chronic small not engaging Hill-Sachs lesion along with blunted appearance of the anterior inferior labrum.  No hagl lesion is present.  Past Medical History:  Diagnosis Date   Anxiety and depression    no current medications   Asthma    Endometriosis    Environmental and seasonal allergies    PCOS (polycystic ovarian syndrome)    Seasonal allergies     Past Surgical History:  Procedure Laterality Date   TONSILLECTOMY     TONSILLECTOMY AND ADENOIDECTOMY      Family History  Problem Relation Age of Onset   Thyroid disease Mother    Hyperlipidemia Mother    Hypertension Mother    Cataracts Mother    Hyperlipidemia Father    COPD Father    Heart attack Father    Hypertension Father    Diabetes Father    Prostate cancer Maternal Grandfather    Social History:  reports that she has quit smoking. Her smoking use included cigarettes. She has never used smokeless tobacco. She reports that she does not currently use alcohol. She reports current drug use. Frequency: 7.00 times per week. Drug: Marijuana.  Allergies:  Allergies  Allergen Reactions   Chicken Meat (Diagnostic) Anaphylaxis   Bee Venom  Hives    swelling   Latex Hives   Soap Hives    Dove soap    Medications Prior to Admission  Medication Sig Dispense Refill   acidophilus (RISAQUAD) CAPS capsule Take 1 capsule by mouth daily.     busPIRone (BUSPAR) 10 MG tablet TAKE 1 TABLET BY MOUTH THREE TIMES A DAY 270 tablet 1   cyclobenzaprine (FLEXERIL) 10 MG tablet Take 1 tablet (10 mg total) by mouth 3 (three) times daily as needed for muscle spasms. 30 tablet 2   EPINEPHrine 0.3 mg/0.3 mL IJ SOAJ injection EpiPen for hypersensitivity reaction. For pediatric patients 15-29 kg, use 0.15 mg dose. For pediatric patients =/> 30 kg and adult patients, use 0.3 mg dose. 2 each 11   escitalopram (LEXAPRO) 20 MG tablet Take 1 tablet (20 mg total) by mouth daily. 90 tablet 3   FERROUS SULFATE PO Take 1 tablet by mouth daily.     ibuprofen (ADVIL) 800 MG tablet Take 1 tablet (800 mg total) by mouth every 8 (eight) hours as needed. 90 tablet 2   norethindrone (ORTHO MICRONOR) 0.35 MG tablet Take 1 tablet (0.35 mg total) by mouth daily. 28 tablet 11   Prenatal Vit-Fe Fumarate-FA (MULTIVITAMIN-PRENATAL) 27-0.8 MG TABS tablet Take 1 tablet by mouth daily at 12 noon.     traMADol (ULTRAM) 50 MG tablet Take 1 tablet (50 mg total) by mouth every 8 (eight)  hours as needed. (Patient not taking: Reported on 07/29/2021) 20 tablet 0    Results for orders placed or performed during the hospital encounter of 08/11/21 (from the past 48 hour(s))  Pregnancy, urine POC     Status: None   Collection Time: 08/11/21 11:09 AM  Result Value Ref Range   Preg Test, Ur NEGATIVE NEGATIVE    Comment:        THE SENSITIVITY OF THIS METHODOLOGY IS >24 mIU/mL    No results found.  Review of Systems  Musculoskeletal:  Positive for arthralgias.  All other systems reviewed and are negative.  Blood pressure (!) 142/80, pulse 86, temperature 98.3 F (36.8 C), temperature source Oral, resp. rate 14, height 5\' 10"  (1.778 m), weight 95.3 kg, last menstrual period  06/19/2020, SpO2 93 %, currently breastfeeding. Physical Exam Vitals reviewed.  HENT:     Head: Normocephalic.     Nose: Nose normal.     Mouth/Throat:     Mouth: Mucous membranes are moist.  Eyes:     Pupils: Pupils are equal, round, and reactive to light.  Cardiovascular:     Rate and Rhythm: Normal rate.     Pulses: Normal pulses.  Pulmonary:     Effort: Pulmonary effort is normal.  Abdominal:     General: Abdomen is flat.  Musculoskeletal:     Cervical back: Normal range of motion.  Skin:    General: Skin is warm.     Capillary Refill: Capillary refill takes less than 2 seconds.  Neurological:     General: No focal deficit present.     Mental Status: She is alert.  Psychiatric:        Mood and Affect: Mood normal.    Ortho exam demonstrates functional deltoid strength on the left with 5 out of 5 grip EPL FPL interosseous wrist flexion wrist extension bicep triceps and deltoid strength.  Radial pulse intact.  Deltoid fires.  Rotator cuff strength is good infraspinatus supraspinatus and subscap muscle testing.  She has passive range of motion with some apprehension on the left of 70/110/175.  Negative O'Brien's testing.  Sulcus sign symmetric slightly less than 1 cm which does not persist with external rotation.  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  Patient does have abnormal appearing anterior inferior labrum.  No humeral avulsion of the glenohumeral ligaments is present.  Plan at this time is arthroscopy with labral repair.  Risk and benefits of the procedure discussed with the patient including but not limited to infection nerve vessel damage some loss of motion recurrent instability as well as a 6 to 8-week rehabilitation.  Which will require diminished use of that left arm.  Patient does have parents here to help her with her newborn child.  Patient does vape but does not smoke.  She is counseled that these things can  affect soft tissue healing.  All questions answered  06/21/2020, MD 08/11/2021, 11:49 AM

## 2021-08-11 NOTE — Brief Op Note (Signed)
   08/11/2021  3:06 PM  PATIENT:  Tonya Hart  30 y.o. female  PRE-OPERATIVE DIAGNOSIS:  left shoulder instability  POST-OPERATIVE DIAGNOSIS:  left shoulder instability  PROCEDURE:  Procedure(s): LEFT SHOULDER ARTHROSCOPY WITH LABRAL REPAIR  SURGEON:  Surgeon(s): August Saucer, Corrie Mckusick, MD  ASSISTANT: magnant pa  ANESTHESIA:   general  EBL: 5 ml    Total I/O In: 100 [IV Piggyback:100] Out: 50 [Blood:50]  BLOOD ADMINISTERED: none  DRAINS: none   LOCAL MEDICATIONS USED:  none  SPECIMEN:  No Specimen  COUNTS:  YES  TOURNIQUET:  * No tourniquets in log *  DICTATION: .Other Dictation: Dictation Number [id number]  PLAN OF CARE: Discharge to home after PACU  PATIENT DISPOSITION:  PACU - hemodynamically stable

## 2021-08-11 NOTE — Transfer of Care (Signed)
Immediate Anesthesia Transfer of Care Note  Patient: Tonya Hart  Procedure(s) Performed: LEFT SHOULDER ARTHROSCOPY WITH LABRAL REPAIR (Left: Shoulder)  Patient Location: PACU  Anesthesia Type:General  Level of Consciousness: awake, alert , oriented and patient cooperative  Airway & Oxygen Therapy: Patient Spontanous Breathing  Post-op Assessment: Report given to RN, Post -op Vital signs reviewed and stable and Patient moving all extremities X 4  Post vital signs: Reviewed and stable  Last Vitals:  Vitals Value Taken Time  BP 130/93 08/11/21 1531  Temp    Pulse 101 08/11/21 1534  Resp 14 08/11/21 1533  SpO2 98 % 08/11/21 1534  Vitals shown include unvalidated device data.  Last Pain:  Vitals:   08/11/21 1054  TempSrc:   PainSc: 7       Patients Stated Pain Goal: 1 (08/11/21 1054)  Complications: No notable events documented.

## 2021-08-11 NOTE — Op Note (Signed)
Tonya Hart, [place] MEDICAL RECORD NO: [id number] ACCOUNT NO: [id number] DATE OF BIRTH: 1991-07-23 FACILITY: [facility] LOCATION: [facility] PHYSICIAN: Graylin Shiver. August Saucer, MD  Operative Report   DATE OF PROCEDURE: 08/11/2021  PREOPERATIVE DIAGNOSIS:  Left shoulder instability.  POSTOPERATIVE DIAGNOSIS:  Left shoulder instability.  PROCEDURE:  Left shoulder arthroscopy with anterior labral repair.  SURGEON ATTENDING:  Graylin Shiver. August Saucer, MD.  ASSISTANT:  Karenann Cai, PA  INDICATIONS:  This is a 30 year old patient with long history of left shoulder instability.  She presents now for operative management after explanation of risks and benefits.  DESCRIPTION OF PROCEDURE:  The patient was brought to the operating room where general anesthetic was induced.  Preoperative antibiotics administered.  Timeout was called.  Right shoulder was examined under anesthesia.  Found to have about 85 degrees of  external rotation, full forward flexion and abduction.  She had 2+ anterior instability, 1+ posterior instability with less than a centimeter sulcus sign.  The patient was placed in the lateral position with the right axilla and right peroneal nerve well  padded.  Arm was suspended, 15 pounds of traction in 10 degrees of forward flexion and 45 degrees of abduction.  Next, the right arm, shoulder and hand prescrubbed with alcohol, Betadine, and allowed to air dry, prepped with DuraPrep solution and draped in a sterile manner.  [name] was used to seal the axilla.  Timeout was called.  Posterior portal was created 2 cm  medial and inferior to the posterolateral margin of the acromion.  Diagnostic arthroscopy was performed.  Anterior portal created under direct visualization.  A secondary anterior portal was then created for drilling.  The glenohumeral articular surfaces  were intact.  No SLAP tear was present.  Posterior inferior glenohumeral ligament intact.  Rotator cuff intact.  The patient did have blunted  labrum from the 10 o'clock to 6 o'clock position.  This labrum was elevated.  The bony glenoid was then  prepared with a rasp and shaver.  Then, beginning at the 6:30 position and working up to the 11:30 position knotless suture anchors were placed.  Labrum was advanced to the knotless suture anchors.  The bites were made with a Knee Scorpion less than or  equal to 5 mm away from the joint surface.  The capsule was fully elevated and visualized to prevent iatrogenic injury.  The suture anchors were then tightened in a sequential fashion from the 6:30, 7:30, 8:30, 9:30 and 10:30 position.  Reattachment  re-tensioning of the labrum was performed with good bumper reestablished.  Thorough irrigation of the shoulder joint was performed.  Instruments were removed. The portals were closed using 3-0 Vicryl, 3-0 nylon.  Impervious dressings applied.  Shoulder  immobilizer applied.  [name]'s assistance was required at all times for retraction, opening, closing, mobilization of tissue.  His assistance was a medical necessity.   PUS D: 08/11/2021 3:11:16 pm T: 08/11/2021 4:26:00 pm  JOB: [id number]/ [id number]

## 2021-08-11 NOTE — Anesthesia Procedure Notes (Signed)
Anesthesia Regional Block: Interscalene brachial plexus block   Pre-Anesthetic Checklist: , timeout performed,  Correct Patient, Correct Site, Correct Laterality,  Correct Procedure, Correct Position, site marked,  Risks and benefits discussed,  Surgical consent,  Pre-op evaluation,  At surgeon's request and post-op pain management  Laterality: Left  Prep: chloraprep       Needles:  Injection technique: Single-shot  Needle Type: Echogenic Stimulator Needle     Needle Length: 10cm  Needle Gauge: 21   Needle insertion depth: 7 cm   Additional Needles:   Procedures:,,,, ultrasound used (permanent image in chart),,   Motor weakness within 7 minutes.  Narrative:  Start time: 08/11/2021 11:23 AM End time: 08/11/2021 11:28 AM Injection made incrementally with aspirations every 5 mL.  Performed by: Personally  Anesthesiologist: Mal Amabile, MD  Additional Notes: Timeout performed. Patient sedated. Relevant anatomy ID'd using [place]. Incremental 2-24ml injection of LA with frequent aspiration. Patient tolerated procedure well.    Left Interscalene Block

## 2021-08-11 NOTE — Anesthesia Procedure Notes (Signed)
Procedure Name: Intubation Date/Time: 08/11/2021 12:49 PM Performed by: Shary Decamp, CRNA Pre-anesthesia Checklist: Patient identified, Patient being monitored, Timeout performed, Emergency Drugs available and Suction available Patient Re-evaluated:Patient Re-evaluated prior to induction Oxygen Delivery Method: Circle System Utilized Preoxygenation: Pre-oxygenation with 100% oxygen Induction Type: IV induction Ventilation: Mask ventilation without difficulty Laryngoscope Size: Miller and 2 Grade View: Grade I Tube type: Oral Tube size: 7.5 mm Number of attempts: 1 Airway Equipment and Method: Stylet Placement Confirmation: ETT inserted through vocal cords under direct vision, positive ETCO2 and breath sounds checked- equal and bilateral Secured at: 21 cm Tube secured with: Tape Dental Injury: Teeth and Oropharynx as per pre-operative assessment

## 2021-08-12 ENCOUNTER — Encounter: Payer: Self-pay | Admitting: Orthopedic Surgery

## 2021-08-13 DIAGNOSIS — M25312 Other instability, left shoulder: Secondary | ICD-10-CM

## 2021-08-16 NOTE — Telephone Encounter (Signed)
Medication: HYDROcodone-acetaminophen (NORCO/VICODIN) 5-325 MG tablet  Prior authorization determination received Medication has been denied Reason for denial: "Because we did not see certain details about your use and treatment. We see that his request is for your use (anterior subluxation of left huoerus, sequela). We may consider approval of this drug in a certain situation (when the prescribing clinician has/is: provided the reason for exceeding 5-day supply limit"

## 2021-08-17 ENCOUNTER — Encounter (HOSPITAL_COMMUNITY): Payer: Self-pay | Admitting: Orthopedic Surgery

## 2021-08-19 ENCOUNTER — Encounter: Payer: Medicaid Other | Admitting: Surgical

## 2021-08-19 ENCOUNTER — Telehealth: Payer: Self-pay | Admitting: Orthopedic Surgery

## 2021-08-19 ENCOUNTER — Other Ambulatory Visit: Payer: Self-pay | Admitting: Surgical

## 2021-08-19 MED ORDER — OXYCODONE-ACETAMINOPHEN 5-325 MG PO TABS: 1.0000 | ORAL_TABLET | Freq: Four times a day (QID) | ORAL | 0 refills | Status: DC | PRN

## 2021-08-19 NOTE — Telephone Encounter (Signed)
Notified pt. 

## 2021-08-19 NOTE — Telephone Encounter (Signed)
Pt would like a refill on her pain rx and would like to have it called in to the [facility] on [address], in [place]. Pt would like a CB when this has been done please.  [phone]

## 2021-08-19 NOTE — Telephone Encounter (Signed)
Sent in

## 2021-08-22 ENCOUNTER — Other Ambulatory Visit: Payer: Self-pay

## 2021-08-22 ENCOUNTER — Ambulatory Visit (INDEPENDENT_AMBULATORY_CARE_PROVIDER_SITE_OTHER): Payer: Medicaid Other | Admitting: Orthopedic Surgery

## 2021-08-22 ENCOUNTER — Encounter: Payer: Self-pay | Admitting: Orthopedic Surgery

## 2021-08-22 DIAGNOSIS — M24412 Recurrent dislocation, left shoulder: Secondary | ICD-10-CM

## 2021-08-22 MED ORDER — GABAPENTIN 300 MG PO CAPS: 300.0000 mg | ORAL_CAPSULE | Freq: Three times a day (TID) | ORAL | 0 refills | Status: DC

## 2021-08-22 NOTE — Progress Notes (Signed)
Post-Op Visit Note   Patient: Tonya Hart           Date of Birth: 10-27-1991           MRN: [id number] Visit Date: 08/22/2021 PCP: Christen Butter, NP   Assessment & Plan:  Chief Complaint:  Chief Complaint  Patient presents with   Left Shoulder - Routine Post Op    Surgery 08/11/21 (11d)   Left Shoulder Arthroscopy With Labral Repair - Left     Visit Diagnoses:  1. Recurrent anterior dislocation of shoulder, left     Plan: Patient is a 30 year old female who presents s/p left shoulder arthroscopy with labral repair on 08/11/2021.  Patient is in the sling full-time.  Not lifting the arm or lifting anything with the arm.  She is using ice for 40 minutes every 2 hours.  She complains of a lot of soreness that radiates from her shoulder down to the mid humerus.  She has difficulty sleeping and wakes with pain at night.  No radicular pain.  On exam she has 15 degrees external rotation, 70 degrees abduction, 90 degrees forward flexion.  Axillary nerve intact with deltoid firing.  2+ radial pulse of the operative extremity.  Incisions are healing well and sutures were removed and replaced with Steri-Strips today.  Plan for patient to continue with sling full-time and no lifting.  Follow-up in 2 weeks for clinical recheck and initiation of below shoulder level range of motion at that time.  Patient agreed with plan.  Went over the arthroscopy pictures with her today.  Follow-up in 2 weeks.  Follow-Up Instructions: No follow-ups on file.   Orders:  No orders of the defined types were placed in this encounter.  Meds ordered this encounter  Medications   gabapentin (NEURONTIN) 300 MG capsule    Sig: Take 1 capsule (300 mg total) by mouth 3 (three) times daily.    Dispense:  30 capsule    Refill:  0    Imaging: No results found.  PMFS History: Patient Active Problem List   Diagnosis Date Noted   Instability of left shoulder joint    Recurrent anterior dislocation of shoulder, left  05/13/2021   Mastitis 04/27/2021   Dysmenorrhea 05/18/2020   Depression with anxiety 04/04/2020   Anxiety state 04/04/2020   Cigarette nicotine dependence without complication 01/04/2018   Past Medical History:  Diagnosis Date   Anxiety and depression    no current medications   Asthma    Endometriosis    Environmental and seasonal allergies    PCOS (polycystic ovarian syndrome)    Seasonal allergies     Family History  Problem Relation Age of Onset   Thyroid disease Mother    Hyperlipidemia Mother    Hypertension Mother    Cataracts Mother    Hyperlipidemia Father    COPD Father    Heart attack Father    Hypertension Father    Diabetes Father    Prostate cancer Maternal Grandfather     Past Surgical History:  Procedure Laterality Date   SHOULDER ARTHROSCOPY WITH LABRAL REPAIR Left 08/11/2021   Procedure: LEFT SHOULDER ARTHROSCOPY WITH LABRAL REPAIR;  Surgeon: Cammy Copa, MD;  Location: [facility] OR;  Service: Orthopedics;  Laterality: Left;   TONSILLECTOMY     TONSILLECTOMY AND ADENOIDECTOMY     Social History   Occupational History   Occupation: ISS    Employer: [employer]  Tobacco Use   Smoking status: Former  Types: Cigarettes   Smokeless tobacco: Never  Vaping Use   Vaping Use: Every day   Last attempt to quit: 08/04/2020   Substances: Nicotine  Substance and Sexual Activity   Alcohol use: Not Currently    Comment: 3 drinks/week, beer or liquor   Drug use: Yes    Frequency: 7.0 times per week    Types: Marijuana   Sexual activity: Yes    Partners: Male    Birth control/protection: Pill

## 2021-08-29 ENCOUNTER — Other Ambulatory Visit: Payer: Self-pay

## 2021-08-30 ENCOUNTER — Encounter: Payer: Self-pay | Admitting: Orthopedic Surgery

## 2021-08-30 MED ORDER — OXYCODONE-ACETAMINOPHEN 5-325 MG PO TABS: 1.0000 | ORAL_TABLET | Freq: Four times a day (QID) | ORAL | 0 refills | Status: DC | PRN

## 2021-09-02 ENCOUNTER — Encounter: Payer: Self-pay | Admitting: Orthopedic Surgery

## 2021-09-02 ENCOUNTER — Ambulatory Visit (INDEPENDENT_AMBULATORY_CARE_PROVIDER_SITE_OTHER): Payer: Medicaid Other | Admitting: Orthopedic Surgery

## 2021-09-02 ENCOUNTER — Other Ambulatory Visit: Payer: Self-pay

## 2021-09-02 VITALS — Ht 69.0 in | Wt 210.0 lb

## 2021-09-02 DIAGNOSIS — M24412 Recurrent dislocation, left shoulder: Secondary | ICD-10-CM

## 2021-09-02 NOTE — Progress Notes (Signed)
Post-Op Visit Note   Patient: Tonya Hart           Date of Birth: [birth year]           MRN: [id number] Visit Date: 09/02/2021 PCP: Christen Butter, NP   Assessment & Plan:  Chief Complaint:  Chief Complaint  Patient presents with   Left Shoulder - Follow-up    08/11/2021 left shoulder arthroscopy with labral repair   Visit Diagnoses:  1. Recurrent anterior dislocation of shoulder, left     Plan: Tonya Hart is a 30 year old patient who underwent left shoulder arthroscopy with labral repair 08/11/2021.  She has been in a sling.  On exam deltoid fires.  Had an episode last night where she was "twitching" in her sleep.  Felt some popping in her shoulder.  On exam the shoulder feels stable today with no coarse grinding or crepitus and good resistance to anterior drawer testing.  Taking gabapentin and oxycodone.  Plan is physical therapy here 2 times a week for 4 weeks.  Beginning for over the next 2 weeks I think she is okay for range of motion less than 30 degrees of external rotation up to but less than 90 degrees of forward flexion and AB duction.  Take sling off after 2 weeks.  Then the second 2 weeks of therapy it is okay for up to 45 degrees of external rotation with overhead motion okay and strengthening okay to start at that time as well.  4-week return.  Follow-Up Instructions: No follow-ups on file.   Orders:  Orders Placed This Encounter  Procedures   Ambulatory referral to Physical Therapy   No orders of the defined types were placed in this encounter.   Imaging: No results found.  PMFS History: Patient Active Problem List   Diagnosis Date Noted   Instability of left shoulder joint    Recurrent anterior dislocation of shoulder, left 05/13/2021   Mastitis 04/27/2021   Dysmenorrhea 05/18/2020   Depression with anxiety 04/04/2020   Anxiety state 04/04/2020   Cigarette nicotine dependence without complication 01/04/2018   Past Medical History:  Diagnosis Date   Anxiety  and depression    no current medications   Asthma    Endometriosis    Environmental and seasonal allergies    PCOS (polycystic ovarian syndrome)    Seasonal allergies     Family History  Problem Relation Age of Onset   Thyroid disease Mother    Hyperlipidemia Mother    Hypertension Mother    Cataracts Mother    Hyperlipidemia Father    COPD Father    Heart attack Father    Hypertension Father    Diabetes Father    Prostate cancer Maternal Grandfather     Past Surgical History:  Procedure Laterality Date   SHOULDER ARTHROSCOPY WITH LABRAL REPAIR Left 08/11/2021   Procedure: LEFT SHOULDER ARTHROSCOPY WITH LABRAL REPAIR;  Surgeon: Cammy Copa, MD;  Location: [facility] OR;  Service: Orthopedics;  Laterality: Left;   TONSILLECTOMY     TONSILLECTOMY AND ADENOIDECTOMY     Social History   Occupational History   Occupation: ISS    Employer: [employer]  Tobacco Use   Smoking status: Former    Types: Cigarettes   Smokeless tobacco: Never  Vaping Use   Vaping Use: Every day   Last attempt to quit: 08/04/2020   Substances: Nicotine  Substance and Sexual Activity   Alcohol use: Not Currently    Comment: 3 drinks/week, beer or liquor  Drug use: Yes    Frequency: 7.0 times per week    Types: Marijuana   Sexual activity: Yes    Partners: Male    Birth control/protection: Pill

## 2021-09-05 ENCOUNTER — Ambulatory Visit: Payer: Medicaid Other | Admitting: Orthopedic Surgery

## 2021-09-06 ENCOUNTER — Encounter: Payer: Self-pay | Admitting: Physical Therapy

## 2021-09-06 ENCOUNTER — Other Ambulatory Visit: Payer: Self-pay

## 2021-09-06 ENCOUNTER — Ambulatory Visit: Payer: Medicaid Other | Attending: Orthopedic Surgery | Admitting: Physical Therapy

## 2021-09-06 DIAGNOSIS — M6281 Muscle weakness (generalized): Secondary | ICD-10-CM | POA: Insufficient documentation

## 2021-09-06 DIAGNOSIS — M25612 Stiffness of left shoulder, not elsewhere classified: Secondary | ICD-10-CM | POA: Diagnosis not present

## 2021-09-06 DIAGNOSIS — R600 Localized edema: Secondary | ICD-10-CM | POA: Insufficient documentation

## 2021-09-06 DIAGNOSIS — M24412 Recurrent dislocation, left shoulder: Secondary | ICD-10-CM | POA: Insufficient documentation

## 2021-09-06 DIAGNOSIS — M25512 Pain in left shoulder: Secondary | ICD-10-CM | POA: Insufficient documentation

## 2021-09-06 DIAGNOSIS — R252 Cramp and spasm: Secondary | ICD-10-CM | POA: Insufficient documentation

## 2021-09-06 NOTE — Patient Instructions (Signed)
Access Code: [email or web address] URL: [email or web address] Date: 09/06/2021 Prepared by: Harrie Foreman  Exercises Seated Scapular Retraction - 3 x daily - 7 x weekly - 1 sets - 5 reps - 5 sec hold Seated Isometric Shoulder Extension - 3 x daily - 7 x weekly - 1 sets - 5 reps - 5 sec hold Seated Isometric Shoulder Flexion - 3 x daily - 7 x weekly - 1 sets - 5 reps - 5 sec hold Seated Isometric Shoulder External Rotation - 3 x daily - 7 x weekly - 1 sets - 5 reps - 5 sec hold Flexion-Extension Shoulder Pendulum with Table Support - 3 x daily - 7 x weekly - 1 sets - 10 reps Putty Squeezes - 3 x daily - 7 x weekly - 1 sets

## 2021-09-06 NOTE — Therapy (Signed)
[facility] [address]  [address] [place], [place], [address] Phone: [phone]   Fax:  [phone]  Physical Therapy Evaluation  Patient Details  Name: Tonya Hart MRN: [id number] Date of Birth: 1991-08-23 Referring Provider (PT): Dr. Rise Paganini   Encounter Date: 09/06/2021   PT End of Session - 09/06/21 1839     Visit Number 1    Number of Visits 12    Date for PT Re-Evaluation 10/18/21    PT Start Time 1445    PT Stop Time 1530    PT Time Calculation (min) 45 min    Equipment Utilized During Treatment Other (comment)   L shoulder sling   Activity Tolerance Patient limited by pain;Patient tolerated treatment well    Behavior During Therapy [facility] for tasks assessed/performed             Past Medical History:  Diagnosis Date   Anxiety and depression    no current medications   Asthma    Endometriosis    Environmental and seasonal allergies    PCOS (polycystic ovarian syndrome)    Seasonal allergies     Past Surgical History:  Procedure Laterality Date   SHOULDER ARTHROSCOPY WITH LABRAL REPAIR Left 08/11/2021   Procedure: LEFT SHOULDER ARTHROSCOPY WITH LABRAL REPAIR;  Surgeon: Cammy Copa, MD;  Location: [facility] OR;  Service: Orthopedics;  Laterality: Left;   TONSILLECTOMY     TONSILLECTOMY AND ADENOIDECTOMY      There were no vitals filed for this visit.    Subjective Assessment - 09/06/21 1457     Subjective Patient reports history of L shoulder dislocatoin and left shoulder arthroscopy with labral repair 08/11/2021.    Pertinent History left shoulder arthroscopy with labral repair 08/11/2021, history of bil shoulder dislocations.    Limitations Lifting;Writing;House hold activities    Patient Stated Goals "lifting overhead again and pick up baby"    Currently in Pain? Yes    Pain Score 8     Pain Location Shoulder    Pain Orientation Left    Pain Descriptors / Indicators Aching;Tingling     Pain Type Surgical pain;Acute pain    Pain Radiating Towards tingling down arm if pressure on it when holding infant    Pain Onset More than a month ago   surgery   Aggravating Factors  if sleeps wrong, more pain killers at night, just being up so more pain in afternoon, if moves wrong.    Pain Relieving Factors pain medications, ice    Effect of Pain on Daily Activities unable to perform most activities.                [facility] PT Assessment - 09/06/21 0001       Assessment   Medical Diagnosis left shoulder arthroscopy with labral repair    Referring Provider (PT) Dr. Rise Paganini    Onset Date/Surgical Date 08/11/21    Hand Dominance Right    Next MD Visit 09/30/2021    Prior Therapy no      Precautions   Precautions Shoulder    Type of Shoulder Precautions 1st 2 weeks ok for ROM <30 ER, <90 FF/ABD; 2nd 2 weks ok for <45 ER, overhead motion ok, strengthening ok    Shoulder Interventions Shoulder sling/immobilizer    Required Braces or Orthoses Sling   for additional 2 weeks until 09/15/21     Restrictions   Weight Bearing Restrictions No  Balance Screen   Has the patient fallen in the past 6 months No    Has the patient had a decrease in activity level because of a fear of falling?  No    Is the patient reluctant to leave their home because of a fear of falling?  No      Home Nurse, mental health Private residence    Living Arrangements Parent    Available Help at Discharge Family    Type of Home House    Home Access Stairs to enter    Entrance Stairs-Number of Steps 5    Entrance Stairs-Rails Right;Left;Can reach both    Home Layout One level      Prior Function   Level of Independence Independent    Vocation Other (comment)   had to stop working due to pregnancy   Vocation Requirements lifting 75 lbs for [employer]    Leisure kayaking      Cognition   Overall Cognitive Status Within Functional Limits for tasks assessed       Observation/Other Assessments   Focus on Therapeutic Outcomes (FOTO)  shoulder 4      ROM / Strength   AROM / PROM / Strength --   deferred today due to post surgical status and pain level.                       Objective measurements completed on examination: See above findings.       [facility] Adult PT Treatment/Exercise - 09/06/21 0001       Self-Care   Self-Care Other Self-Care Comments    Other Self-Care Comments  desensitization techniques      Exercises   Exercises Shoulder      Shoulder Exercises: ROM/Strengthening   Pendulum reviewed correct technique      Shoulder Exercises: Isometric Strengthening   Flexion 5X5";Limitations    Flexion Limitations arm at side in sling, submax to tolerance    Extension 5X5";Limitations    Extension Limitations arm at side in sling, submax to tolerance    External Rotation 5X5";Limitations    External Rotation Limitations arm at side in sling, submax to tolerance    Internal Rotation 5X5";Limitations    Internal Rotation Limitations arm at side in sling, submax to tolerance    Other Isometric Exercises putty squeezes with yellow theraputty, scap squeezes isometric      Modalities   Modalities Cryotherapy      Cryotherapy   Number Minutes Cryotherapy 9 Minutes    Cryotherapy Location Shoulder   left   Type of Cryotherapy Other (comment)   game ready                    PT Education - 09/06/21 1554     Education Details Initial HEP - all exercises to be performed with shoulder in sling in scapular plane, submax isometric to tolerance, issued yellow theraputty.    Person(s) Educated Patient    Methods Explanation;Demonstration;Handout;Verbal cues    Comprehension Verbalized understanding;Returned demonstration              PT Short Term Goals - 09/06/21 1851       PT SHORT TERM GOAL #1   Title Patient will be independent with initial HEP    Time 2    Period Weeks    Status New    Target Date  09/20/21      PT SHORT TERM GOAL #2  Title Patient will demonstrate L shoulder PROM 90 deg flexion/abduction, 30 deg ER    Baseline deferred today due to pain    Time 2    Period Weeks    Status New    Target Date 09/20/21               PT Long Term Goals - 09/06/21 1851       PT LONG TERM GOAL #1   Title Patient will be independent with ongoing/advanced HEP for self-management at home    Time 6    Period Weeks    Status New    Target Date 10/18/21      PT LONG TERM GOAL #2   Title Patient will be able to lift L arm overhead for dressing/ADLs without pain.    Time 6    Period Weeks    Status New    Target Date 10/18/21      PT LONG TERM GOAL #3   Title Patient will be able to pick up 25# in order to pick up infant daughter.    Time 6    Period Weeks    Status New    Target Date 10/18/21      PT LONG TERM GOAL #4   Title Pt. will report no L shoulder pain at rest or sleeping to improve QOL    Time 6    Period Weeks    Status New    Target Date 10/18/21                    Plan - 09/06/21 1840     Clinical Impression Statement Ranada Vigorito is a 30 year old female referred for s/p left shoulder arthroscopy with labral repair 08/11/2021.  She reports history of L shoulder dislocations and instability.  R shoulder is also dislocates but not to extend of L shoulder.  She reports continued severe post surgical pain.  She was given pendulum exercises but these are very painful and not completing correctly.  Due to severity of pain with even submax isometric exercises, deferred ROM assessment today.  She was given initial HEP for submax isometric exercise to tolerance to start activating shoulder/scapular musculature, desensitization techniques for shoulder in preparation for scar tissue mobilization, followed by game ready to L shoulder to decrease pain.  She would benefit from skilled physical therapy to decrease L shoulder pain, improve ROM, and improve  strength in order to care for child and return to work.    Personal Factors and Comorbidities Comorbidity 2    Comorbidities bil shoulder instability, depression, anxiety    Examination-Activity Limitations Bathing;Bed Mobility;Dressing;Hygiene/Grooming;Caring for Others;Carry;Lift;Sleep;Reach Overhead    Examination-Participation Restrictions Interpersonal Relationship;Meal Prep;Occupation;Community Activity;Shop    Stability/Clinical Decision Making Stable/Uncomplicated    Clinical Decision Making Low    Rehab Potential Good    PT Frequency 2x / week    PT Duration 6 weeks    PT Treatment/Interventions ADLs/Self Care Home Management;Cryotherapy;Electrical Stimulation;Ultrasound;Functional mobility training;Therapeutic activities;Therapeutic exercise;Neuromuscular re-education;Patient/family education;Manual techniques;Scar mobilization;Passive range of motion;Dry needling;Taping;Vasopneumatic Device;Joint Manipulations    PT Next Visit Plan gentle ROM to L shoulder, review HEP, AAROM if tolerated    PT Home Exercise Plan Access Code: [email or web address]    Consulted and Agree with Plan of Care Patient             Patient will benefit from skilled therapeutic intervention in order to improve the following deficits and impairments:  Decreased activity tolerance, Decreased range of motion, Decreased  skin integrity, Decreased strength, Increased fascial restricitons, Impaired UE functional use, Pain, Decreased scar mobility, Increased muscle spasms, Impaired flexibility, Postural dysfunction  Visit Diagnosis: Acute pain of left shoulder  Stiffness of left shoulder, not elsewhere classified  Muscle weakness (generalized)  Localized edema  Cramp and spasm     Problem List Patient Active Problem List   Diagnosis Date Noted   Instability of left shoulder joint    Recurrent anterior dislocation of shoulder, left 05/13/2021   Mastitis 04/27/2021   Dysmenorrhea 05/18/2020   Depression with  anxiety 04/04/2020   Anxiety state 04/04/2020   Cigarette nicotine dependence without complication 01/04/2018    Jena Gauss, PT, DPT 09/06/2021, 6:58 PM  [facility] [address]  [address] [place], [place], [address] Phone: [phone]   Fax:  [phone]  Name: Ariyonna Twichell MRN: [id number] Date of Birth: [birth year]

## 2021-09-08 ENCOUNTER — Ambulatory Visit: Payer: Medicaid Other

## 2021-09-08 ENCOUNTER — Other Ambulatory Visit: Payer: Self-pay

## 2021-09-08 DIAGNOSIS — M25512 Pain in left shoulder: Secondary | ICD-10-CM

## 2021-09-08 DIAGNOSIS — R600 Localized edema: Secondary | ICD-10-CM | POA: Diagnosis not present

## 2021-09-08 DIAGNOSIS — R252 Cramp and spasm: Secondary | ICD-10-CM | POA: Diagnosis not present

## 2021-09-08 DIAGNOSIS — M24412 Recurrent dislocation, left shoulder: Secondary | ICD-10-CM | POA: Diagnosis not present

## 2021-09-08 DIAGNOSIS — M6281 Muscle weakness (generalized): Secondary | ICD-10-CM

## 2021-09-08 DIAGNOSIS — M25612 Stiffness of left shoulder, not elsewhere classified: Secondary | ICD-10-CM | POA: Diagnosis not present

## 2021-09-08 MED ORDER — OXYCODONE-ACETAMINOPHEN 5-325 MG PO TABS: 1.0000 | ORAL_TABLET | Freq: Three times a day (TID) | ORAL | 0 refills | Status: DC | PRN

## 2021-09-08 NOTE — Therapy (Addendum)
PHYSICAL THERAPY DISCHARGE SUMMARY  Visits from Start of Care: 2  Current functional level related to goals / functional outcomes: No progress.  Patient was extremely guarded and only returned for 1 visit, extremely painful and limited.  AT NO TIME DID SHE PERFORM ANY OVERHEAD MOVEMENTS OR RESISTED MOVEMENTS IN PHYSICAL THERAPY AND WAS NEVER INSTRUCTED TO DO SO.  Unfortunately, she returned to orthopedist and claimed that she had been doing overhead resistance movements.    Remaining deficits: See below   Education / Equipment: Initial HEP for pendulums, table slides and gentle isometric exercise to tolerance.    Plan:  Patient goals were not met. Patient is being discharged due to never returning to physical therapy after 09/08/2021.     Rennie Natter, PT, DPT 11/25/2021.    [facility] [address]  [place], [place], [address] Phone: [phone]   Fax:  [phone]  Physical Therapy Treatment  Patient Details  Name: Tonya Hart MRN: [id number] Date of Birth: [birth year] Referring Provider (PT): Dr. Marcene Duos   Encounter Date: 09/08/2021   PT End of Session - 09/08/21 1616     Visit Number 2    Number of Visits 12    Date for PT Re-Evaluation 10/18/21    PT Start Time 9622    PT Stop Time 1612    PT Time Calculation (min) 38 min    Activity Tolerance Patient tolerated treatment well;Patient limited by pain    Behavior During Therapy [facility] for tasks assessed/performed             Past Medical History:  Diagnosis Date   Anxiety and depression    no current medications   Asthma    Endometriosis    Environmental and seasonal allergies    PCOS (polycystic ovarian syndrome)    Seasonal allergies     Past Surgical History:  Procedure Laterality Date   SHOULDER ARTHROSCOPY WITH LABRAL REPAIR Left 08/11/2021   Procedure: LEFT SHOULDER ARTHROSCOPY WITH LABRAL REPAIR;  Surgeon: Meredith Pel, MD;  Location: [facility];  Service: Orthopedics;  Laterality: Left;   TONSILLECTOMY     TONSILLECTOMY AND ADENOIDECTOMY      There were no vitals filed for this visit.   Subjective Assessment - 09/08/21 1537     Subjective Pt reports that her L shoulder bothers her when she does the pendulum exercise. Constantly notes soreness when trying to move her shoulder.    Pertinent History left shoulder arthroscopy with labral repair 08/11/2021, history of bil shoulder dislocations.    Patient Stated Goals "lifting overhead again and pick up baby"    Currently in Pain? Yes    Pain Score 7     Pain Location Shoulder    Pain Orientation Left    Pain Descriptors / Indicators Sore;Burning    Pain Type Acute pain;Surgical pain                               [facility] Adult PT Treatment/Exercise - 09/08/21 0001       Exercises   Exercises Shoulder      Shoulder Exercises: Seated   Retraction AROM;Both;20 reps    Retraction Limitations shoulder squeezes    Flexion AROM;Left;20 reps    Flexion Limitations table slides; cues to avoid pain    Other Seated Exercises scaption table slides 2x10; cues to avoid pain    Other  Seated Exercises fwd shoulder rolls 15 reps      Shoulder Exercises: Standing   Other Standing Exercises fwd, side to side pendulum small motion 10 reps each      Shoulder Exercises: Pulleys   Flexion 1 minute    Flexion Limitations PROM with R arm assist - below 90 deg     Shoulder Exercises: Therapy Ball   Flexion Both    Flexion Limitations 3x10 with green pball  -below 90 deg   ABduction Both    ABduction Limitations 3x10 with green pball - below 90 deg                       PT Short Term Goals - 09/08/21 1743       PT SHORT TERM GOAL #1   Title Patient will be independent with initial HEP    Time 2    Period Weeks    Status On-going    Target Date 09/20/21      PT SHORT TERM GOAL #2   Title Patient will demonstrate L  shoulder PROM 90 deg flexion/abduction, 30 deg ER    Baseline deferred today due to pain    Time 2    Period Weeks    Status On-going    Target Date 09/20/21               PT Long Term Goals - 09/08/21 1744       PT LONG TERM GOAL #1   Title Patient will be independent with ongoing/advanced HEP for self-management at home    Time 6    Period Weeks    Status On-going      PT LONG TERM GOAL #2   Title Patient will be able to lift L arm overhead for dressing/ADLs without pain.    Time 6    Period Weeks    Status On-going      PT LONG TERM GOAL #3   Title Patient will be able to pick up 25# in order to pick up infant daughter.    Time 6    Period Weeks    Status On-going      PT LONG TERM GOAL #4   Title Pt. will report no L shoulder pain at rest or sleeping to improve QOL    Time 6    Period Weeks    Status On-going                   Plan - 09/08/21 1618     Clinical Impression Statement Pt presented today with constant L shoulder pain with concentric movement. Exercises were performed today with decreased ROM due to pain but noticed increased ROM with each rep. Cues needed to use body movement with exercises to avoid shoulder strain. Updated HEP with table slides and lateral pendulums to improve GH mobility. Pt denied CP post session.    Personal Factors and Comorbidities Comorbidity 2    Comorbidities bil shoulder instability, depression, anxiety    PT Frequency 2x / week    PT Duration 6 weeks    PT Treatment/Interventions ADLs/Self Care Home Management;Cryotherapy;Electrical Stimulation;Ultrasound;Functional mobility training;Therapeutic activities;Therapeutic exercise;Neuromuscular re-education;Patient/family education;Manual techniques;Scar mobilization;Passive range of motion;Dry needling;Taping;Vasopneumatic Device;Joint Manipulations    PT Next Visit Plan progress periscap strengthening, L shoulder AROM    PT Home Exercise Plan Access Code: [email or web address]     Consulted and Agree with Plan of Care Patient  Patient will benefit from skilled therapeutic intervention in order to improve the following deficits and impairments:  Decreased activity tolerance, Decreased range of motion, Decreased skin integrity, Decreased strength, Increased fascial restricitons, Impaired UE functional use, Pain, Decreased scar mobility, Increased muscle spasms, Impaired flexibility, Postural dysfunction  Visit Diagnosis: Acute pain of left shoulder  Stiffness of left shoulder, not elsewhere classified  Muscle weakness (generalized)  Localized edema  Cramp and spasm     Problem List Patient Active Problem List   Diagnosis Date Noted   Instability of left shoulder joint    Recurrent anterior dislocation of shoulder, left 05/13/2021   Mastitis 04/27/2021   Dysmenorrhea 05/18/2020   Depression with anxiety 04/04/2020   Anxiety state 04/04/2020   Cigarette nicotine dependence without complication 79/72/8206    Artist Pais, PTA 09/08/2021, 5:45 PM  [facility] [address]  [place], [place], [address] Phone: [phone]   Fax:  [phone]  Name: Eevee Borbon MRN: [id number] Date of Birth: 02/25/1991

## 2021-09-11 MED ORDER — GABAPENTIN 300 MG PO CAPS: 300.0000 mg | ORAL_CAPSULE | Freq: Three times a day (TID) | ORAL | 0 refills | Status: DC

## 2021-09-13 ENCOUNTER — Ambulatory Visit: Payer: Medicaid Other

## 2021-09-14 ENCOUNTER — Other Ambulatory Visit: Payer: Self-pay

## 2021-09-14 MED ORDER — OXYCODONE-ACETAMINOPHEN 5-325 MG PO TABS: 1.0000 | ORAL_TABLET | Freq: Three times a day (TID) | ORAL | 0 refills | Status: DC | PRN

## 2021-09-15 ENCOUNTER — Ambulatory Visit: Payer: Medicaid Other

## 2021-09-22 ENCOUNTER — Other Ambulatory Visit: Payer: Self-pay

## 2021-09-23 MED ORDER — GABAPENTIN 300 MG PO CAPS: 300.0000 mg | ORAL_CAPSULE | Freq: Three times a day (TID) | ORAL | 0 refills | Status: DC

## 2021-09-23 MED ORDER — OXYCODONE-ACETAMINOPHEN 5-325 MG PO TABS: 1.0000 | ORAL_TABLET | Freq: Three times a day (TID) | ORAL | 0 refills | Status: DC | PRN

## 2021-09-26 ENCOUNTER — Ambulatory Visit (INDEPENDENT_AMBULATORY_CARE_PROVIDER_SITE_OTHER): Payer: Medicaid Other | Admitting: Medical-Surgical

## 2021-09-26 ENCOUNTER — Encounter: Payer: Self-pay | Admitting: Medical-Surgical

## 2021-09-26 ENCOUNTER — Other Ambulatory Visit: Payer: Self-pay

## 2021-09-26 VITALS — BP 110/73 | HR 84 | Temp 97.9°F | Ht 69.0 in | Wt 201.0 lb

## 2021-09-26 DIAGNOSIS — F418 Other specified anxiety disorders: Secondary | ICD-10-CM | POA: Diagnosis not present

## 2021-09-26 DIAGNOSIS — Z23 Encounter for immunization: Secondary | ICD-10-CM | POA: Diagnosis not present

## 2021-09-26 DIAGNOSIS — Z0000 Encounter for general adult medical examination without abnormal findings: Secondary | ICD-10-CM

## 2021-09-26 DIAGNOSIS — F411 Generalized anxiety disorder: Secondary | ICD-10-CM | POA: Diagnosis not present

## 2021-09-26 DIAGNOSIS — Z13220 Encounter for screening for lipoid disorders: Secondary | ICD-10-CM

## 2021-09-26 DIAGNOSIS — Z1329 Encounter for screening for other suspected endocrine disorder: Secondary | ICD-10-CM | POA: Diagnosis not present

## 2021-09-26 DIAGNOSIS — F410 Panic disorder [episodic paroxysmal anxiety] without agoraphobia: Secondary | ICD-10-CM | POA: Diagnosis not present

## 2021-09-26 NOTE — Patient Instructions (Signed)
Preventive Care 5-28 Years Old, Female Preventive care refers to lifestyle choices and visits with your health care provider that can promote health and wellness. Preventive care visits are also called wellness exams. What can I expect for my preventive care visit? Counseling During your preventive care visit, your health care provider may ask about your: Medical history, including: Past medical problems. Family medical history. Pregnancy history. Current health, including: Menstrual cycle. Method of birth control. Emotional well-being. Home life and relationship well-being. Sexual activity and sexual health. Lifestyle, including: Alcohol, nicotine or tobacco, and drug use. Access to firearms. Diet, exercise, and sleep habits. Work and work Statistician. Sunscreen use. Safety issues such as seatbelt and bike helmet use. Physical exam Your health care provider may check your: Height and weight. These may be used to calculate your BMI (body mass index). BMI is a measurement that tells if you are at a healthy weight. Waist circumference. This measures the distance around your waistline. This measurement also tells if you are at a healthy weight and may help predict your risk of certain diseases, such as type 2 diabetes and high blood pressure. Heart rate and blood pressure. Body temperature. Skin for abnormal spots. What immunizations do I need? Vaccines are usually given at various ages, according to a schedule. Your health care provider will recommend vaccines for you based on your age, medical history, and lifestyle or other factors, such as travel or where you work. What tests do I need? Screening Your health care provider may recommend screening tests for certain conditions. This may include: Pelvic exam and Pap test. Lipid and cholesterol levels. Diabetes screening. This is done by checking your blood sugar (glucose) after you have not eaten for a while (fasting). Hepatitis B  test. Hepatitis C test. HIV (human immunodeficiency virus) test. STI (sexually transmitted infection) testing, if you are at risk. BRCA-related cancer screening. This may be done if you have a family history of breast, ovarian, tubal, or peritoneal cancers. Talk with your health care provider about your test results, treatment options, and if necessary, the need for more tests. Follow these instructions at home: Eating and drinking  Eat a healthy diet that includes fresh fruits and vegetables, whole grains, lean protein, and low-fat dairy products. Take vitamin and mineral supplements as recommended by your health care provider. Do not drink alcohol if: Your health care provider tells you not to drink. You are pregnant, may be pregnant, or are planning to become pregnant. If you drink alcohol: Limit how much you have to 0-1 drink a day. Know how much alcohol is in your drink. In the [place] one drink equals one 12 oz bottle of beer (355 mL), one 5 oz glass of wine (148 mL), or one 1 oz glass of hard liquor (44 mL). Lifestyle Brush your teeth every morning and night with fluoride toothpaste. Floss one time each day. Exercise for at least 30 minutes 5 or more days each week. Do not use any products that contain nicotine or tobacco. These products include cigarettes, chewing tobacco, and vaping devices, such as e-cigarettes. If you need help quitting, ask your health care provider. Do not use drugs. If you are sexually active, practice safe sex. Use a condom or other form of protection to prevent STIs. If you do not wish to become pregnant, use a form of birth control. If you plan to become pregnant, see your health care provider for a prepregnancy visit. Find healthy ways to manage stress, such as: Meditation, yoga,  or listening to music. Journaling. Talking to a trusted person. Spending time with friends and family. Minimize exposure to UV radiation to reduce your risk of skin  cancer. Safety Always wear your seat belt while driving or riding in a vehicle. Do not drive: If you have been drinking alcohol. Do not ride with someone who has been drinking. If you have been using any mind-altering substances or drugs. While texting. When you are tired or distracted. Wear a helmet and other protective equipment during sports activities. If you have firearms in your house, make sure you follow all gun safety procedures. Seek help if you have been physically or sexually abused. What's next? Go to your health care provider once a year for an annual wellness visit. Ask your health care provider how often you should have your eyes and teeth checked. Stay up to date on all vaccines. This information is not intended to replace advice given to you by your health care provider. Make sure you discuss any questions you have with your health care provider. Document Revised: 05/04/2021 Document Reviewed: 05/04/2021 Elsevier Patient Education  [place]. Influenza (Flu) Vaccine (Inactivated or Recombinant): What You Need to Know 1. Why get vaccinated? Influenza vaccine can prevent influenza (flu). Flu is a contagious disease that spreads around the [place] every year, usually between October and May. Anyone can get the flu, but it is more dangerous for some people. Infants and young children, people 75 years and older, pregnant people, and people with certain health conditions or a weakened immune system are at greatest risk of flu complications. Pneumonia, bronchitis, sinus infections, and ear infections are examples of flu-related complications. If you have a medical condition, such as heart disease, cancer, or diabetes, flu can make it worse. Flu can cause fever and chills, sore throat, muscle aches, fatigue, cough, headache, and runny or stuffy nose. Some people may have vomiting and diarrhea, though this is more common in children than adults. In an average year,  thousands of people in the [place] die from flu, and many more are hospitalized. Flu vaccine prevents millions of illnesses and flu-related visits to the doctor each year. 2. Influenza vaccines CDC recommends everyone 6 months and older get vaccinated every flu season. Children 6 months through 17 years of age may need 2 doses during a single flu season. Everyone else needs only 1 dose each flu season. It takes about 2 weeks for protection to develop after vaccination. There are many flu viruses, and they are always changing. Each year a new flu vaccine is made to protect against the influenza viruses believed to be likely to cause disease in the upcoming flu season. Even when the vaccine doesn't exactly match these viruses, it may still provide some protection. Influenza vaccine does not cause flu. Influenza vaccine may be given at the same time as other vaccines. 3. Talk with your health care provider Tell your vaccination provider if the person getting the vaccine: Has had an allergic reaction after a previous dose of influenza vaccine, or has any severe, life-threatening allergies Has ever had Guillain-Barr Syndrome (also called "GBS") In some cases, your health care provider may decide to postpone influenza vaccination until a future visit. Influenza vaccine can be administered at any time during pregnancy. People who are or will be pregnant during influenza season should receive inactivated influenza vaccine. People with minor illnesses, such as a cold, may be vaccinated. People who are moderately or severely ill should usually wait until  they recover before getting influenza vaccine. Your health care provider can give you more information. 4. Risks of a vaccine reaction Soreness, redness, and swelling where the shot is given, fever, muscle aches, and headache can happen after influenza vaccination. There may be a very small increased risk of Guillain-Barr Syndrome (GBS) after  inactivated influenza vaccine (the flu shot). Young children who get the flu shot along with pneumococcal vaccine (PCV13) and/or DTaP vaccine at the same time might be slightly more likely to have a seizure caused by fever. Tell your health care provider if a child who is getting flu vaccine has ever had a seizure. People sometimes faint after medical procedures, including vaccination. Tell your provider if you feel dizzy or have vision changes or ringing in the ears. As with any medicine, there is a very remote chance of a vaccine causing a severe allergic reaction, other serious injury, or death. 5. What if there is a serious problem? An allergic reaction could occur after the vaccinated person leaves the clinic. If you see signs of a severe allergic reaction (hives, swelling of the face and throat, difficulty breathing, a fast heartbeat, dizziness, or weakness), call 9-1-1 and get the person to the nearest hospital. For other signs that concern you, call your health care provider. Adverse reactions should be reported to the Vaccine Adverse Event Reporting System (VAERS). Your health care provider will usually file this report, or you can do it yourself. Visit the VAERS website at www.vaers.SamedayNews.es or call (402)190-2083. VAERS is only for reporting reactions, and VAERS staff members do not give medical advice. 6. The National Vaccine Injury Compensation Program The [employer] Vaccine Injury Compensation Program (VICP) is a federal program that was created to compensate people who may have been injured by certain vaccines. Claims regarding alleged injury or death due to vaccination have a time limit for filing, which may be as short as two years. Visit the VICP website at GoldCloset.com.ee or call 726 456 4289 to learn about the program and about filing a claim. 7. How can I learn more? Ask your health care provider. Call your local or state health department. Visit the website of the  Food and Drug Administration (FDA) for vaccine package inserts and additional information at TraderRating.uy. Contact the Centers for Disease Control and Prevention (CDC): Call 571 729 9819 (1-800-CDC-INFO) or Visit CDC's website at https://gibson.com/. Vaccine Information Statement Inactivated Influenza Vaccine (06/25/2020) This information is not intended to replace advice given to you by your health care provider. Make sure you discuss any questions you have with your health care provider. Document Revised: 07/28/2021 Document Reviewed: 07/28/2021 Elsevier Patient Education  2022 [employer].

## 2021-09-26 NOTE — Progress Notes (Signed)
HPI: Tonya Hart is a 30 y.o. female who  has a past medical history of Anxiety and depression, Asthma, Endometriosis, Environmental and seasonal allergies, PCOS (polycystic ovarian syndrome), and Seasonal allergies.  she presents to [facility] today, 09/26/21,  for chief complaint of: Annual physical exam  Dentist: UTD Eye exam: UTD, wears glasses Exercise: None intentional, doing physical therapy for her shoulder Diet: no restrictions Pap smear: UTD COVID vaccine: None  Concerns: Anxiety, depression, and panic attacks are worse than ever. Feels like meds aren't working. Breastfeeding.   Past medical, surgical, social and family history reviewed:  Patient Active Problem List   Diagnosis Date Noted   Instability of left shoulder joint    Recurrent anterior dislocation of shoulder, left 05/13/2021   Mastitis 04/27/2021   Dysmenorrhea 05/18/2020   Depression with anxiety 04/04/2020   Anxiety state 04/04/2020   Cigarette nicotine dependence without complication 01/04/2018    Past Surgical History:  Procedure Laterality Date   SHOULDER ARTHROSCOPY WITH LABRAL REPAIR Left 08/11/2021   Procedure: LEFT SHOULDER ARTHROSCOPY WITH LABRAL REPAIR;  Surgeon: Dean, Gregory Scott, MD;  Location: [facility] OR;  Service: Orthopedics;  Laterality: Left;   TONSILLECTOMY     TONSILLECTOMY AND ADENOIDECTOMY      Social History   Tobacco Use   Smoking status: Former    Types: Cigarettes   Smokeless tobacco: Never  Substance Use Topics   Alcohol use: Not Currently    Comment: 3 drinks/week, beer or liquor    Family History  Problem Relation Age of Onset   Thyroid disease Mother    Hyperlipidemia Mother    Hypertension Mother    Cataracts Mother    Hyperlipidemia Father    COPD Father    Heart attack Father    Hypertension Father    Diabetes Father    Prostate cancer Maternal Grandfather      Current medication list and allergy/intolerance  information reviewed:    Current Outpatient Medications  Medication Sig Dispense Refill   acidophilus (RISAQUAD) CAPS capsule Take 1 capsule by mouth daily.     busPIRone (BUSPAR) 10 MG tablet TAKE 1 TABLET BY MOUTH THREE TIMES A DAY 270 tablet 1   EPINEPHrine 0.3 mg/0.3 mL IJ SOAJ injection EpiPen for hypersensitivity reaction. For pediatric patients 15-29 kg, use 0.15 mg dose. For pediatric patients =/> 30 kg and adult patients, use 0.3 mg dose. 2 each 11   escitalopram (LEXAPRO) 20 MG tablet Take 1 tablet (20 mg total) by mouth daily. 90 tablet 3   FERROUS SULFATE PO Take 1 tablet by mouth daily.     gabapentin (NEURONTIN) 300 MG capsule Take 1 capsule (300 mg total) by mouth 3 (three) times daily. 30 capsule 0   ibuprofen (ADVIL) 800 MG tablet Take 1 tablet (800 mg total) by mouth every 8 (eight) hours as needed. 90 tablet 2   norethindrone (ORTHO MICRONOR) 0.35 MG tablet Take 1 tablet (0.35 mg total) by mouth daily. 28 tablet 11   oxyCODONE-acetaminophen (PERCOCET) 5-325 MG tablet Take 1 tablet by mouth every 8 (eight) hours as needed for severe pain. 30 tablet 0   Prenatal Vit-Fe Fumarate-FA (MULTIVITAMIN-PRENATAL) 27-0.8 MG TABS tablet Take 1 tablet by mouth daily at 12 noon.     cyclobenzaprine (FLEXERIL) 10 MG tablet Take 1 tablet (10 mg total) by mouth 3 (three) times daily as needed for muscle spasms. 30 tablet 2   No current facility-administered medications for this visit.    Allergies    Allergen Reactions   Chicken Meat (Diagnostic) Anaphylaxis   Bee Venom Hives    swelling   Latex Hives   Soap Hives    Dove soap      Review of Systems: Constitutional:  No  fever, no chills, No recent illness, No unintentional weight changes. No significant fatigue.  HEENT: No  headache, no vision change, no hearing change, No sore throat, No  sinus pressure Cardiac: No  chest pain, No  pressure,+ palpitations, No  Orthopnea Respiratory:  + shortness of breath. No   Cough Gastrointestinal: No  abdominal pain, No  nausea, No  vomiting,  No  blood in stool, No  diarrhea, No  constipation  Musculoskeletal: No new myalgia/arthralgia Skin: No  Rash, No other wounds/concerning lesions Genitourinary: No  incontinence, No  abnormal genital bleeding, No abnormal genital discharge Hem/Onc: No  easy bruising/bleeding, No  abnormal lymph node Endocrine: No cold intolerance,  No heat intolerance. No polyuria/polydipsia/polyphagia  Neurologic: No  weakness, No  dizziness, No  slurred speech/focal weakness/facial droop Psychiatric: + concerns with depression, + concerns with anxiety, + sleep problems, + mood problems  Exam:  BP 110/73   Pulse 84   Temp 97.9 F (36.6 C)   Ht 5' 9" (1.753 m)   Wt 201 lb (91.2 kg)   SpO2 96%   BMI 29.68 kg/m  Constitutional: VS see above. General Appearance: alert, well-developed, well-nourished, NAD Eyes: Normal lids and conjunctive, non-icteric sclera Ears, Nose, Mouth, Throat: MMM, Normal external inspection ears/nares/mouth/lips/gums. TM normal bilaterally.  Neck: No masses, trachea midline. No thyroid enlargement. No tenderness/mass appreciated. No lymphadenopathy Respiratory: Normal respiratory effort. no wheeze, no rhonchi, no rales Cardiovascular: S1/S2 normal, no murmur, no rub/gallop auscultated. RRR. No lower extremity edema. Pedal pulse II/IV bilaterally PT. No carotid bruit or JVD. No abdominal aortic bruit. Gastrointestinal: Nontender, no masses. No hepatomegaly, no splenomegaly. No hernia appreciated. Bowel sounds normal. Rectal exam deferred.  Musculoskeletal: Gait normal. No clubbing/cyanosis of digits.  Neurological: Normal balance/coordination. No tremor. No cranial nerve deficit on limited exam. Motor and sensation intact and symmetric. Cerebellar reflexes intact.  Skin: warm, dry, intact. No rash/ulcer. No concerning nevi or subq nodules on limited exam.   Psychiatric: Normal judgment/insight. Normal mood and  affect. Oriented x3.    ASSESSMENT/PLAN:   1. Annual physical exam Deferring labs today as patient has her baby with her and requested to have the blood drawn at her next visit. UTD on preventative care.   2. Screening for lipid disorders Deferring labs today.   3. Screening for endocrine disorder Deferring labs today.   4. Need for influenza vaccination Flu vaccine given in office.  - Flu Vaccine MDCK QUAD PF  5. Anxiety state 6. Depression with anxiety 7. Panic attacks Options limited by her breastfeeding status. Referring to psychiatry. - Ambulatory referral to Psychiatry  Orders Placed This Encounter  Procedures   Flu Vaccine MDCK QUAD PF   Ambulatory referral to Psychiatry   No orders of the defined types were placed in this encounter.  Patient Instructions  Preventive Care 21-39 Years Old, Female Preventive care refers to lifestyle choices and visits with your health care provider that can promote health and wellness. Preventive care visits are also called wellness exams. What can I expect for my preventive care visit? Counseling During your preventive care visit, your health care provider may ask about your: Medical history, including: Past medical problems. Family medical history. Pregnancy history. Current health, including: Menstrual cycle. Method of birth control.   Emotional well-being. Home life and relationship well-being. Sexual activity and sexual health. Lifestyle, including: Alcohol, nicotine or tobacco, and drug use. Access to firearms. Diet, exercise, and sleep habits. Work and work Statistician. Sunscreen use. Safety issues such as seatbelt and bike helmet use. Physical exam Your health care provider may check your: Height and weight. These may be used to calculate your BMI (body mass index). BMI is a measurement that tells if you are at a healthy weight. Waist circumference. This measures the distance around your waistline. This measurement  also tells if you are at a healthy weight and may help predict your risk of certain diseases, such as type 2 diabetes and high blood pressure. Heart rate and blood pressure. Body temperature. Skin for abnormal spots. What immunizations do I need? Vaccines are usually given at various ages, according to a schedule. Your health care provider will recommend vaccines for you based on your age, medical history, and lifestyle or other factors, such as travel or where you work. What tests do I need? Screening Your health care provider may recommend screening tests for certain conditions. This may include: Pelvic exam and Pap test. Lipid and cholesterol levels. Diabetes screening. This is done by checking your blood sugar (glucose) after you have not eaten for a while (fasting). Hepatitis B test. Hepatitis C test. HIV (human immunodeficiency virus) test. STI (sexually transmitted infection) testing, if you are at risk. BRCA-related cancer screening. This may be done if you have a family history of breast, ovarian, tubal, or peritoneal cancers. Talk with your health care provider about your test results, treatment options, and if necessary, the need for more tests. Follow these instructions at home: Eating and drinking  Eat a healthy diet that includes fresh fruits and vegetables, whole grains, lean protein, and low-fat dairy products. Take vitamin and mineral supplements as recommended by your health care provider. Do not drink alcohol if: Your health care provider tells you not to drink. You are pregnant, may be pregnant, or are planning to become pregnant. If you drink alcohol: Limit how much you have to 0-1 drink a day. Know how much alcohol is in your drink. In the [place] one drink equals one 12 oz bottle of beer (355 mL), one 5 oz glass of wine (148 mL), or one 1 oz glass of hard liquor (44 mL). Lifestyle Brush your teeth every morning and night with fluoride toothpaste. Floss one time  each day. Exercise for at least 30 minutes 5 or more days each week. Do not use any products that contain nicotine or tobacco. These products include cigarettes, chewing tobacco, and vaping devices, such as e-cigarettes. If you need help quitting, ask your health care provider. Do not use drugs. If you are sexually active, practice safe sex. Use a condom or other form of protection to prevent STIs. If you do not wish to become pregnant, use a form of birth control. If you plan to become pregnant, see your health care provider for a prepregnancy visit. Find healthy ways to manage stress, such as: Meditation, yoga, or listening to music. Journaling. Talking to a trusted person. Spending time with friends and family. Minimize exposure to UV radiation to reduce your risk of skin cancer. Safety Always wear your seat belt while driving or riding in a vehicle. Do not drive: If you have been drinking alcohol. Do not ride with someone who has been drinking. If you have been using any mind-altering substances or drugs. While texting. When you are tired  or distracted. Wear a helmet and other protective equipment during sports activities. If you have firearms in your house, make sure you follow all gun safety procedures. Seek help if you have been physically or sexually abused. What's next? Go to your health care provider once a year for an annual wellness visit. Ask your health care provider how often you should have your eyes and teeth checked. Stay up to date on all vaccines. This information is not intended to replace advice given to you by your health care provider. Make sure you discuss any questions you have with your health care provider. Document Revised: 05/04/2021 Document Reviewed: 05/04/2021 Elsevier Patient Education  [place]. Influenza (Flu) Vaccine (Inactivated or Recombinant): What You Need to Know 1. Why get vaccinated? Influenza vaccine can prevent influenza  (flu). Flu is a contagious disease that spreads around the [place] every year, usually between October and May. Anyone can get the flu, but it is more dangerous for some people. Infants and young children, people 74 years and older, pregnant people, and people with certain health conditions or a weakened immune system are at greatest risk of flu complications. Pneumonia, bronchitis, sinus infections, and ear infections are examples of flu-related complications. If you have a medical condition, such as heart disease, cancer, or diabetes, flu can make it worse. Flu can cause fever and chills, sore throat, muscle aches, fatigue, cough, headache, and runny or stuffy nose. Some people may have vomiting and diarrhea, though this is more common in children than adults. In an average year, thousands of people in the [place] die from flu, and many more are hospitalized. Flu vaccine prevents millions of illnesses and flu-related visits to the doctor each year. 2. Influenza vaccines CDC recommends everyone 6 months and older get vaccinated every flu season. Children 6 months through 19 years of age may need 2 doses during a single flu season. Everyone else needs only 1 dose each flu season. It takes about 2 weeks for protection to develop after vaccination. There are many flu viruses, and they are always changing. Each year a new flu vaccine is made to protect against the influenza viruses believed to be likely to cause disease in the upcoming flu season. Even when the vaccine doesn't exactly match these viruses, it may still provide some protection. Influenza vaccine does not cause flu. Influenza vaccine may be given at the same time as other vaccines. 3. Talk with your health care provider Tell your vaccination provider if the person getting the vaccine: Has had an allergic reaction after a previous dose of influenza vaccine, or has any severe, life-threatening allergies Has ever had Guillain-Barr  Syndrome (also called "GBS") In some cases, your health care provider may decide to postpone influenza vaccination until a future visit. Influenza vaccine can be administered at any time during pregnancy. People who are or will be pregnant during influenza season should receive inactivated influenza vaccine. People with minor illnesses, such as a cold, may be vaccinated. People who are moderately or severely ill should usually wait until they recover before getting influenza vaccine. Your health care provider can give you more information. 4. Risks of a vaccine reaction Soreness, redness, and swelling where the shot is given, fever, muscle aches, and headache can happen after influenza vaccination. There may be a very small increased risk of Guillain-Barr Syndrome (GBS) after inactivated influenza vaccine (the flu shot). Young children who get the flu shot along with pneumococcal vaccine (PCV13) and/or DTaP vaccine at  the same time might be slightly more likely to have a seizure caused by fever. Tell your health care provider if a child who is getting flu vaccine has ever had a seizure. People sometimes faint after medical procedures, including vaccination. Tell your provider if you feel dizzy or have vision changes or ringing in the ears. As with any medicine, there is a very remote chance of a vaccine causing a severe allergic reaction, other serious injury, or death. 5. What if there is a serious problem? An allergic reaction could occur after the vaccinated person leaves the clinic. If you see signs of a severe allergic reaction (hives, swelling of the face and throat, difficulty breathing, a fast heartbeat, dizziness, or weakness), call 9-1-1 and get the person to the nearest hospital. For other signs that concern you, call your health care provider. Adverse reactions should be reported to the Vaccine Adverse Event Reporting System (VAERS). Your health care provider will usually file this report,  or you can do it yourself. Visit the VAERS website at www.vaers.SamedayNews.es or call (334)771-3144. VAERS is only for reporting reactions, and VAERS staff members do not give medical advice. 6. The National Vaccine Injury Compensation Program The [employer] Vaccine Injury Compensation Program (VICP) is a federal program that was created to compensate people who may have been injured by certain vaccines. Claims regarding alleged injury or death due to vaccination have a time limit for filing, which may be as short as two years. Visit the VICP website at GoldCloset.com.ee or call 580 291 1068 to learn about the program and about filing a claim. 7. How can I learn more? Ask your health care provider. Call your local or state health department. Visit the website of the Food and Drug Administration (FDA) for vaccine package inserts and additional information at TraderRating.uy. Contact the Centers for Disease Control and Prevention (CDC): Call 630 888 5956 (1-800-CDC-INFO) or Visit CDC's website at https://gibson.com/. Vaccine Information Statement Inactivated Influenza Vaccine (06/25/2020) This information is not intended to replace advice given to you by your health care provider. Make sure you discuss any questions you have with your health care provider. Document Revised: 07/28/2021 Document Reviewed: 07/28/2021 Elsevier Patient Education  [place].  Follow-up plan: Return in about 6 months (around 03/26/2022) for mood follow up.  Clearnce Sorrel, DNP, APRN, FNP-BC [facility]

## 2021-09-27 ENCOUNTER — Ambulatory Visit: Payer: Medicaid Other

## 2021-09-30 ENCOUNTER — Ambulatory Visit: Payer: Medicaid Other | Admitting: Orthopedic Surgery

## 2021-09-30 ENCOUNTER — Other Ambulatory Visit: Payer: Self-pay

## 2021-09-30 ENCOUNTER — Ambulatory Visit (INDEPENDENT_AMBULATORY_CARE_PROVIDER_SITE_OTHER): Payer: Medicaid Other

## 2021-09-30 ENCOUNTER — Encounter: Payer: Self-pay | Admitting: Surgical

## 2021-09-30 ENCOUNTER — Ambulatory Visit (INDEPENDENT_AMBULATORY_CARE_PROVIDER_SITE_OTHER): Payer: Medicaid Other | Admitting: Surgical

## 2021-09-30 DIAGNOSIS — Z9889 Other specified postprocedural states: Secondary | ICD-10-CM | POA: Diagnosis not present

## 2021-09-30 MED ORDER — HYDROCODONE-ACETAMINOPHEN 5-325 MG PO TABS: 1.0000 | ORAL_TABLET | Freq: Three times a day (TID) | ORAL | 0 refills | Status: DC | PRN

## 2021-09-30 MED ORDER — GABAPENTIN 300 MG PO CAPS: 300.0000 mg | ORAL_CAPSULE | Freq: Three times a day (TID) | ORAL | 0 refills | Status: DC

## 2021-09-30 NOTE — Progress Notes (Signed)
Post-Op Visit Note   Patient: Tonya Hart           Date of Birth: [birth year]           MRN: [id number] Visit Date: 09/30/2021 PCP: Christen Butter, NP   Assessment & Plan:  Chief Complaint:  Chief Complaint  Patient presents with   Left Shoulder - Post-op Follow-up   Visit Diagnoses:  1. Post-operative state     Plan: Patient is a 30 year old female who presents s/p left shoulder arthroscopy with labral repair on 08/11/2021.  She states that, although she feels she is doing better than she was in mid October when she saw Dr. August Saucer, she complains of continued moderate to severe pain in the left shoulder.  She feels like the shoulder catches and grinds when she lifts her arm up primarily.  She cannot lay on her left side for more than an hour without severe increase in pain.  She has been going to physical therapy 2 times per week but however she has placed this on hold since the continued catching symptoms in her shoulder.  She is taking oxycodone on about every 10 hours as needed for pain as well as gabapentin.  No falls or injuries.  On exam she has 35 degrees external rotation, 70 degrees abduction, 90 degrees forward flexion.  Incisions are well-healed from prior surgery.  Axillary nerve intact with deltoid firing.  Sensation intact over the lateral deltoid.  5/5 motor strength of bilateral grip strength, finger abduction, pronation/supination, bicep, tricep, deltoid.  2+ bicep tendon reflex on left.  No significant laxity noted to anterior and posterior directed forces of the left shoulder though patient does have guarding throughout exam due to pain.  Impression is exacerbation of left shoulder postoperative pain with grinding and catching symptoms that have been present since she was prematurely instructed to begin overhead motion of the left shoulder at around 3 weeks out from procedure at her early PT appointments.  She states that she was doing the shoulder pulley within the first few  visits of physical therapy around 3 to 4 weeks out.  Plan to discontinue all overhead range of motion and no lifting with the operative arm.  Give it 2 weeks and then she will return for repeat evaluation by Dr. August Saucer with consideration of initiation of overhead motion at that point.  Seems that even in the early days postoperatively, she has good range of motion with ability to achieve 90 degrees of forward flexion.  She will continue with below shoulder range of motion for the next 2 weeks before we reinitiate overhead motion at that point.  Refilled pain medicine with going down to hydrocodone instead of oxycodone.  Left shoulder radiographs taken today do not demonstrate any concerning findings.  Encouraging that she feels she is still progressively improving from the surgery though she does note occasional "shifting" in the left shoulder that sounds like they could be small subluxation events or she may just be feeling muscle spasms.  Difficult to say at this point time.  Follow-Up Instructions: No follow-ups on file.   Orders:  Orders Placed This Encounter  Procedures   XR Shoulder Left   Meds ordered this encounter  Medications   HYDROcodone-acetaminophen (NORCO/VICODIN) 5-325 MG tablet    Sig: Take 1 tablet by mouth every 8 (eight) hours as needed for moderate pain.    Dispense:  30 tablet    Refill:  0   gabapentin (NEURONTIN) 300 MG capsule  Sig: Take 1 capsule (300 mg total) by mouth 3 (three) times daily.    Dispense:  30 capsule    Refill:  0    Imaging: No results found.  PMFS History: Patient Active Problem List   Diagnosis Date Noted   Instability of left shoulder joint    Recurrent anterior dislocation of shoulder, left 05/13/2021   Mastitis 04/27/2021   Dysmenorrhea 05/18/2020   Depression with anxiety 04/04/2020   Anxiety state 04/04/2020   Cigarette nicotine dependence without complication 01/04/2018   Past Medical History:  Diagnosis Date   Anxiety and  depression    no current medications   Asthma    Endometriosis    Environmental and seasonal allergies    PCOS (polycystic ovarian syndrome)    Seasonal allergies     Family History  Problem Relation Age of Onset   Thyroid disease Mother    Hyperlipidemia Mother    Hypertension Mother    Cataracts Mother    Hyperlipidemia Father    COPD Father    Heart attack Father    Hypertension Father    Diabetes Father    Prostate cancer Maternal Grandfather     Past Surgical History:  Procedure Laterality Date   SHOULDER ARTHROSCOPY WITH LABRAL REPAIR Left 08/11/2021   Procedure: LEFT SHOULDER ARTHROSCOPY WITH LABRAL REPAIR;  Surgeon: Cammy Copa, MD;  Location: [facility] OR;  Service: Orthopedics;  Laterality: Left;   TONSILLECTOMY     TONSILLECTOMY AND ADENOIDECTOMY     Social History   Occupational History   Occupation: ISS    Employer: [employer]  Tobacco Use   Smoking status: Former    Types: Cigarettes   Smokeless tobacco: Never  Vaping Use   Vaping Use: Every day   Last attempt to quit: 08/04/2020   Substances: Nicotine  Substance and Sexual Activity   Alcohol use: Not Currently    Comment: 3 drinks/week, beer or liquor   Drug use: Yes    Frequency: 7.0 times per week    Types: Marijuana   Sexual activity: Yes    Partners: Male    Birth control/protection: Pill

## 2021-10-06 ENCOUNTER — Encounter: Payer: Medicaid Other | Admitting: Physical Therapy

## 2021-10-06 ENCOUNTER — Encounter: Payer: Self-pay | Admitting: Medical-Surgical

## 2021-10-06 ENCOUNTER — Other Ambulatory Visit: Payer: Self-pay | Admitting: Medical-Surgical

## 2021-10-06 ENCOUNTER — Other Ambulatory Visit: Payer: Self-pay | Admitting: Surgical

## 2021-10-06 MED ORDER — HYDROCODONE-ACETAMINOPHEN 5-325 MG PO TABS: 1.0000 | ORAL_TABLET | Freq: Three times a day (TID) | ORAL | 0 refills | Status: DC | PRN

## 2021-10-06 MED ORDER — ALBUTEROL SULFATE HFA 108 (90 BASE) MCG/ACT IN AERS: 2.0000 | INHALATION_SPRAY | Freq: Four times a day (QID) | RESPIRATORY_TRACT | 11 refills | Status: DC | PRN

## 2021-10-10 ENCOUNTER — Encounter: Payer: Self-pay | Admitting: Medical-Surgical

## 2021-10-11 ENCOUNTER — Encounter: Payer: Medicaid Other | Admitting: Physical Therapy

## 2021-10-14 ENCOUNTER — Other Ambulatory Visit (HOSPITAL_BASED_OUTPATIENT_CLINIC_OR_DEPARTMENT_OTHER): Payer: Self-pay | Admitting: Orthopaedic Surgery

## 2021-10-14 MED ORDER — HYDROCODONE-ACETAMINOPHEN 5-325 MG PO TABS: 1.0000 | ORAL_TABLET | Freq: Three times a day (TID) | ORAL | 0 refills | Status: DC | PRN

## 2021-10-14 MED ORDER — GABAPENTIN 300 MG PO CAPS: 300.0000 mg | ORAL_CAPSULE | Freq: Three times a day (TID) | ORAL | 0 refills | Status: DC

## 2021-10-19 ENCOUNTER — Encounter: Payer: Medicaid Other | Admitting: Orthopedic Surgery

## 2021-10-20 ENCOUNTER — Other Ambulatory Visit (HOSPITAL_BASED_OUTPATIENT_CLINIC_OR_DEPARTMENT_OTHER): Payer: Self-pay | Admitting: Orthopaedic Surgery

## 2021-10-21 ENCOUNTER — Other Ambulatory Visit: Payer: Self-pay | Admitting: Surgical

## 2021-10-21 MED ORDER — GABAPENTIN 300 MG PO CAPS: 300.0000 mg | ORAL_CAPSULE | Freq: Three times a day (TID) | ORAL | 0 refills | Status: DC

## 2021-10-21 MED ORDER — HYDROCODONE-ACETAMINOPHEN 5-325 MG PO TABS: 1.0000 | ORAL_TABLET | Freq: Three times a day (TID) | ORAL | 0 refills | Status: DC | PRN

## 2021-10-25 ENCOUNTER — Telehealth: Payer: Self-pay

## 2021-10-25 NOTE — Telephone Encounter (Signed)
Submitted PA to [email or web address] for norco

## 2021-10-28 ENCOUNTER — Encounter: Payer: Self-pay | Admitting: Medical-Surgical

## 2021-10-28 ENCOUNTER — Ambulatory Visit (INDEPENDENT_AMBULATORY_CARE_PROVIDER_SITE_OTHER): Payer: Medicaid Other | Admitting: Medical-Surgical

## 2021-10-28 ENCOUNTER — Other Ambulatory Visit: Payer: Self-pay

## 2021-10-28 ENCOUNTER — Ambulatory Visit: Payer: Medicaid Other | Admitting: Surgical

## 2021-10-28 VITALS — BP 123/77 | HR 85 | Resp 20 | Ht 69.0 in | Wt 203.7 lb

## 2021-10-28 DIAGNOSIS — R0681 Apnea, not elsewhere classified: Secondary | ICD-10-CM

## 2021-10-28 DIAGNOSIS — G4719 Other hypersomnia: Secondary | ICD-10-CM

## 2021-10-28 NOTE — Progress Notes (Signed)
  HPI with pertinent ROS:   CC: breathing concerns  HPI: Pleasant 30 year old female presenting with complaints of breathing issues while asleep. She was recently told by her parents that she stops breathing in her sleep. She was monitored for about 10 minutes and was told she stopped breathing 5 or 6 times. She is not sure how long each episode lasted. Admits that she snores at times but doesn't wake her self at night. Very fatigued during the day and recently has been falling asleep during the day and being hard to wake up. Her parents both use CPAPs at night for OSA and she is interested in getting a sleep study to see if that might be contributing to her daily exhaustion.   I reviewed the past medical history, family history, social history, surgical history, and allergies today and no changes were needed.  Please see the problem list section below in epic for further details.   Physical exam:   General: Well Developed, well nourished, and in no acute distress.  Neuro: Alert and oriented x3, extra-ocular muscles intact, sensation grossly intact.  HEENT: Normocephalic, atraumatic, pupils equal round reactive to light, neck supple, no masses, no lymphadenopathy, thyroid nonpalpable.  Skin: Warm and dry, no rashes. Cardiac: Regular rate and rhythm, no murmurs rubs or gallops, no lower extremity edema.  Respiratory: Clear to auscultation bilaterally. Not using accessory muscles, speaking in full sentences.  Impression and Recommendations:    1. Excessive daytime sleepiness 2. Apneic episode Home sleep study ordered.   STOP-BANG for SLEEP APNEA Do you Snore loudly? Yes Do you often feel Tired during day? Yes Has anyone Observed you stop breathing? Yes History of high blood Pressure? No BMI >35? No Age >50? No Neck circumference >16 in? Yes Gender female? No 5-8 = high risk 3-4 = intermediate 0-2 = low risk   Return if symptoms worsen or fail to  improve. ___________________________________________ Thayer Ohm, DNP, APRN, FNP-BC Primary Care and Sports Medicine [facility] [place]

## 2021-10-31 ENCOUNTER — Ambulatory Visit (INDEPENDENT_AMBULATORY_CARE_PROVIDER_SITE_OTHER): Payer: Medicaid Other | Admitting: Orthopedic Surgery

## 2021-10-31 ENCOUNTER — Other Ambulatory Visit: Payer: Self-pay

## 2021-10-31 ENCOUNTER — Other Ambulatory Visit (HOSPITAL_COMMUNITY): Payer: Self-pay

## 2021-10-31 DIAGNOSIS — M24412 Recurrent dislocation, left shoulder: Secondary | ICD-10-CM

## 2021-10-31 MED ORDER — HYDROCODONE-ACETAMINOPHEN 5-325 MG PO TABS
1.0000 | ORAL_TABLET | Freq: Every day | ORAL | 0 refills | Status: DC
  Filled 2021-10-31: qty 25, 25d supply, fill #0

## 2021-11-03 ENCOUNTER — Encounter: Payer: Self-pay | Admitting: Orthopedic Surgery

## 2021-11-03 NOTE — Progress Notes (Signed)
Office Visit Note   Patient: Tonya Hart           Date of Birth: June 17, 1991           MRN: [id number] Visit Date: 10/31/2021 Requested by: Tonya Hart [address] [place],  [place] [address] PCP: Tonya Hart  Subjective: Chief Complaint  Patient presents with   Left Shoulder - Routine Post Op    HPI: Tonya Hart is a patient with left shoulder pain.  She underwent labral repair earlier this year.  She is reporting some occasional popping and is hard for her to sleep on that left-hand side.  She did do some overhead resistance work around 5 weeks postop.  Taking Neurontin and Norco.  Hurts her to pick up her child.  Her parents have left now she has been out of therapy for 2 weeks to take a break to let the shoulder recover.  On examination she does have a stable shoulder and I do not detect any mechanical symptoms.  Plan is 4-week return decide for or against intra-articular injection at that point.  Okay to strengthen but below shoulder level.  Stretching above shoulder level is okay.  Norco refill x1 only.              ROS: See above  Assessment & Plan: Visit Diagnoses: No diagnosis found.  Plan: See above  Follow-Up Instructions: No follow-ups on file.   Orders:  No orders of the defined types were placed in this encounter.  Meds ordered this encounter  Medications   HYDROcodone-acetaminophen (NORCO/VICODIN) 5-325 MG tablet    Sig: Take 1 tablet by mouth at bedtime for pain.    Dispense:  25 tablet    Refill:  0      Procedures: No procedures performed   Clinical Data: No additional findings.  Objective: Vital Signs: There were no vitals taken for this visit.  Physical Exam: See above  Ortho Exam: See above  Specialty Comments:  No specialty comments available.  Imaging: No results found.   PMFS History: Patient Active Problem List   Diagnosis Date Noted   Instability of left shoulder joint    Recurrent anterior dislocation  of shoulder, left 05/13/2021   Mastitis 04/27/2021   Dysmenorrhea 05/18/2020   Depression with anxiety 04/04/2020   Anxiety state 04/04/2020   Cigarette nicotine dependence without complication 01/04/2018   Past Medical History:  Diagnosis Date   Anxiety and depression    no current medications   Asthma    Endometriosis    Environmental and seasonal allergies    PCOS (polycystic ovarian syndrome)    Seasonal allergies     Family History  Problem Relation Age of Onset   Thyroid disease Mother    Hyperlipidemia Mother    Hypertension Mother    Cataracts Mother    Hyperlipidemia Father    COPD Father    Heart attack Father    Hypertension Father    Diabetes Father    Prostate cancer Maternal Grandfather     Past Surgical History:  Procedure Laterality Date   SHOULDER ARTHROSCOPY WITH LABRAL REPAIR Left 08/11/2021   Procedure: LEFT SHOULDER ARTHROSCOPY WITH LABRAL REPAIR;  Surgeon: Tonya Copa, MD;  Location: [facility] OR;  Service: Orthopedics;  Laterality: Left;   TONSILLECTOMY     TONSILLECTOMY AND ADENOIDECTOMY     Social History   Occupational History   Occupation: ISS    Employer: [employer]  Tobacco  Use   Smoking status: Former    Types: Cigarettes   Smokeless tobacco: Never  Vaping Use   Vaping Use: Every day   Last attempt to quit: 08/04/2020   Substances: Nicotine  Substance and Sexual Activity   Alcohol use: Not Currently    Comment: 3 drinks/week, beer or liquor   Drug use: Yes    Frequency: 7.0 times per week    Types: Marijuana   Sexual activity: Yes    Partners: Male    Birth control/protection: Pill

## 2021-11-17 ENCOUNTER — Encounter: Payer: Self-pay | Admitting: Orthopedic Surgery

## 2021-11-18 ENCOUNTER — Ambulatory Visit (INDEPENDENT_AMBULATORY_CARE_PROVIDER_SITE_OTHER): Payer: Medicaid Other | Admitting: Physician Assistant

## 2021-11-18 ENCOUNTER — Encounter: Payer: Self-pay | Admitting: Physician Assistant

## 2021-11-18 ENCOUNTER — Ambulatory Visit: Payer: Self-pay

## 2021-11-18 ENCOUNTER — Other Ambulatory Visit: Payer: Self-pay

## 2021-11-18 DIAGNOSIS — M25512 Pain in left shoulder: Secondary | ICD-10-CM

## 2021-11-18 NOTE — Telephone Encounter (Signed)
Pt called stating she missed a call and would like to be tried again. Pt states she will be available for the rest of the day.   [phone]

## 2021-11-18 NOTE — Progress Notes (Signed)
Office Visit Note   Patient: Tonya Hart           Date of Birth: [birth year]           MRN: [id number] Visit Date: 11/18/2021              Requested by: Christen Butter, NP [address] [place],  [place] [address] PCP: Christen Butter, NP  Chief Complaint  Patient presents with   Left Shoulder - Follow-up      HPI: Patient is a pleasant 30 year old woman who is little over 3 months status post left shoulder arthroscopy with labral repair.  She has struggled with pain and neuropathic symptoms.  She has been on Neurontin 300 mg 3 times daily as well as hydrocodone at night.  She said that about a week ago she lost her balance and fell onto her outstretched left arm.  Since then she feels her pain is increased.  She describes it as sharp shooting pain.  She denies any neck pain.  She also gets paresthesias of her hand if her arm is hanging down too much.  Denies any fever or chills  Assessment & Plan: Visit Diagnoses:  1. Left shoulder pain, unspecified chronicity     Plan: Status post labral repair left shoulder.  She is very hesitant to any kind of exam.  She is able to oppose all her fingers but says it hurts that she is able to grip but says it hurts she does have some apprehension with external rotation.  She can raise her arm to 90 degrees.  I do not see anything on x-ray.  I think she can treat this symptomatically but if she wants to we could order an MRI.  Decrease activity use the sling as needed but should still do pendulum exercises so her arm does not get stiff also offered to try steroid injection today however she says she does not want to do that because she has her baby with her and does not have a ride to drive her  Follow-Up Instructions: No follow-ups on file.   Ortho Exam  Patient is alert, oriented, no adenopathy, well-dressed, normal affect, normal respiratory effort. Left shoulder status post labral repair.  No asymmetry.  She is tender even to light  touch over the shoulder.  All range of motion is painful to her.  She does have good grip but says she hesitates to grip too hard because it hurts.  Distal pulses are intact and oppose all fingers.  Imaging: XR Shoulder Left  Result Date: 11/18/2021 Radiographs of her left shoulder today demonstrate humeral head is reduced in the glenoid.  Good equal spacing no signs of acute osseous injuries very little or no change since x-rays done in November  No images are attached to the encounter.  Labs: No results found for: HGBA1C, ESRSEDRATE, CRP, LABURIC, REPTSTATUS, GRAMSTAIN, CULT, LABORGA   Lab Results  Component Value Date   ALBUMIN 2.8 (L) 03/31/2021    No results found for: MG No results found for: VD25OH  No results found for: PREALBUMIN CBC EXTENDED Latest Ref Rng & Units 08/02/2021 04/01/2021 03/31/2021  WBC 4.0 - 10.5 K/uL 6.0 15.1(H) 10.2  RBC 3.87 - 5.11 MIL/uL 4.36 3.77(L) 4.07  HGB 12.0 - 15.0 g/dL 22.3 36.1 22.4  HCT 49.7 - 46.0 % 41.3 35.8(L) 38.0  PLT 150 - 400 K/uL 237 159 195  NEUTROABS 1,500 - 7,800 cells/uL - - -  LYMPHSABS 850 -  3,900 cells/uL - - -     There is no height or weight on file to calculate BMI.  Orders:  Orders Placed This Encounter  Procedures   XR Shoulder Left   No orders of the defined types were placed in this encounter.    Procedures: No procedures performed  Clinical Data: No additional findings.  ROS:  All other systems negative, except as noted in the HPI. Review of Systems  Objective: Vital Signs: There were no vitals taken for this visit.  Specialty Comments:  No specialty comments available.  PMFS History: Patient Active Problem List   Diagnosis Date Noted   Instability of left shoulder joint    Recurrent anterior dislocation of shoulder, left 05/13/2021   Mastitis 04/27/2021   Dysmenorrhea 05/18/2020   Depression with anxiety 04/04/2020   Anxiety state 04/04/2020   Cigarette nicotine dependence without  complication 01/04/2018   Past Medical History:  Diagnosis Date   Anxiety and depression    no current medications   Asthma    Endometriosis    Environmental and seasonal allergies    PCOS (polycystic ovarian syndrome)    Seasonal allergies     Family History  Problem Relation Age of Onset   Thyroid disease Mother    Hyperlipidemia Mother    Hypertension Mother    Cataracts Mother    Hyperlipidemia Father    COPD Father    Heart attack Father    Hypertension Father    Diabetes Father    Prostate cancer Maternal Grandfather     Past Surgical History:  Procedure Laterality Date   SHOULDER ARTHROSCOPY WITH LABRAL REPAIR Left 08/11/2021   Procedure: LEFT SHOULDER ARTHROSCOPY WITH LABRAL REPAIR;  Surgeon: Cammy Copa, MD;  Location: [facility] OR;  Service: Orthopedics;  Laterality: Left;   TONSILLECTOMY     TONSILLECTOMY AND ADENOIDECTOMY     Social History   Occupational History   Occupation: ISS    Employer: [employer]  Tobacco Use   Smoking status: Former    Types: Cigarettes   Smokeless tobacco: Never  Vaping Use   Vaping Use: Every day   Last attempt to quit: 08/04/2020   Substances: Nicotine  Substance and Sexual Activity   Alcohol use: Not Currently    Comment: 3 drinks/week, beer or liquor   Drug use: Yes    Frequency: 7.0 times per week    Types: Marijuana   Sexual activity: Yes    Partners: Male    Birth control/protection: Pill

## 2021-11-28 ENCOUNTER — Ambulatory Visit (INDEPENDENT_AMBULATORY_CARE_PROVIDER_SITE_OTHER): Payer: Medicaid Other

## 2021-11-28 ENCOUNTER — Ambulatory Visit: Payer: Self-pay

## 2021-11-28 ENCOUNTER — Encounter: Payer: Self-pay | Admitting: Surgical

## 2021-11-28 ENCOUNTER — Ambulatory Visit (INDEPENDENT_AMBULATORY_CARE_PROVIDER_SITE_OTHER): Payer: Medicaid Other | Admitting: Surgical

## 2021-11-28 DIAGNOSIS — M79602 Pain in left arm: Secondary | ICD-10-CM | POA: Diagnosis not present

## 2021-11-28 DIAGNOSIS — M24412 Recurrent dislocation, left shoulder: Secondary | ICD-10-CM | POA: Diagnosis not present

## 2021-11-28 MED ORDER — LIDOCAINE HCL 1 % IJ SOLN
5.0000 mL | INTRAMUSCULAR | Status: AC | PRN
  Administered 2021-11-28: 5 mL

## 2021-11-28 MED ORDER — BUPIVACAINE HCL 0.5 % IJ SOLN
9.0000 mL | INTRAMUSCULAR | Status: AC | PRN
  Administered 2021-11-28: 9 mL via INTRA_ARTICULAR

## 2021-11-28 MED ORDER — METHYLPREDNISOLONE ACETATE 40 MG/ML IJ SUSP
40.0000 mg | INTRAMUSCULAR | Status: AC | PRN
  Administered 2021-11-28: 40 mg via INTRA_ARTICULAR

## 2021-11-28 MED ORDER — HYDROCODONE-ACETAMINOPHEN 5-325 MG PO TABS: 1.0000 | ORAL_TABLET | Freq: Every day | ORAL | 0 refills | Status: DC

## 2021-11-28 NOTE — Progress Notes (Signed)
Office Visit Note   Patient: Tonya Hart           Date of Birth: 09-16-1991           MRN: [id number] Visit Date: 11/28/2021 Requested by: Christen Butter, NP [address] [place],  [place] [address] PCP: Christen Butter, NP  Subjective: Chief Complaint  Patient presents with   Left Shoulder - Pain    HPI: Tonya Hart is a 31 y.o. female who presents to the office complaining of continued left shoulder pain.  Patient is s/p left shoulder arthroscopy with labral repair on 08/11/2021.  She has had continued pain since surgery that has been flared up after she jarred her shoulder while catching herself while falling about 2 weeks ago.  She describes primarily left shoulder pain diffusely with occasional numbness and tingling down her elbow and sometimes to her fingers.  She also complains of left-sided neck pain and medial scapular border pain.  She is taking Norco at night for assistance with sleeping.  She also uses heat and ice.  She describes the pain as a "hot knife".  Pain is waking her up every night..                ROS: All systems reviewed are negative as they relate to the chief complaint within the history of present illness.  Patient denies fevers or chills.  Assessment & Plan: Visit Diagnoses:  1. Left arm pain   2. Recurrent anterior dislocation of shoulder, left     Plan: Patient is a 31 year old female who presents for repeat evaluation of left shoulder pain.  She is s/p left shoulder arthroscopy with labral repair on 08/11/2021.  She has continued pain that is mostly centered around her shoulder but she does have some complaints that are concerning for neuropathic or radicular pain.  Radiographs of the cervical spine were taken today and are negative for any abnormalities aside from loss of lordosis.  Discussed options available to patient and after discussion, patient would like to try left shoulder glenohumeral injection.  Glenohumeral injection was  administered under ultrasound guidance and patient tolerated the procedure well.  After administration of the injection, patient had good pain relief and felt she was able to move her shoulder around without the pain she was having prior to injection.  She will give this 10 to 14 days to take effect and then send [place] a MyChart message or call the office with how she is feeling.  If no lasting relief, could consider MRI of the cervical spine for further evaluation of neuropathic/radicular pain.  Patient agreed with plan.  Follow-up after further correspondence in 2 weeks.  Follow-Up Instructions: No follow-ups on file.   Orders:  Orders Placed This Encounter  Procedures   XR Cervical Spine 2 or 3 views   US Guided Needle Placement - No Linked Charges   No orders of the defined types were placed in this encounter.     Procedures: Large Joint Inj: L glenohumeral on 11/28/2021 4:47 PM Indications: diagnostic evaluation and pain Details: 18 G 1.5 in and 3.5 in needle, ultrasound-guided posterior approach  Arthrogram: No  Medications: 9 mL bupivacaine 0.5 %; 40 mg methylPREDNISolone acetate 40 MG/ML; 5 mL lidocaine 1 % Outcome: tolerated well, no immediate complications Procedure, treatment alternatives, risks and benefits explained, specific risks discussed. Consent was given by the patient. Immediately prior to procedure a time out was called to verify the correct patient, procedure, equipment,  support staff and site/side marked as required. Patient was prepped and draped in the usual sterile fashion.      Clinical Data: No additional findings.  Objective: Vital Signs: There were no vitals taken for this visit.  Physical Exam:  Constitutional: Patient appears well-developed HEENT:  Head: Normocephalic Eyes:EOM are normal Neck: Normal range of motion Cardiovascular: Normal rate Pulmonary/chest: Effort normal Neurologic: Patient is alert Skin: Skin is warm Psychiatric: Patient has  normal mood and affect  Ortho Exam: Ortho exam demonstrates left shoulder with 60 degrees external rotation, 95 degrees abduction, 135 degrees forward flexion.  Incisions are well-healed from prior surgery.  Excellent strength of rotator cuff on exam today.  5 -/5 strength of left grip strength, pronation, supination, bicep with 5/5 strength of the other upper extremity motor groups bilaterally.  Shoulder is stable to anterior and posterior directed forces.  Patient complains of pain with passive motion of the shoulder in any direction.  Sensation intact through all dermatomes of the left upper extremity.  Minimal pain with cervical spine range of motion.  No tenderness over the axial cervical spine.  Negative Spurling sign.  Negative Lhermitte sign  Specialty Comments:  No specialty comments available.  Imaging: No results found.   PMFS History: Patient Active Problem List   Diagnosis Date Noted   Instability of left shoulder joint    Recurrent anterior dislocation of shoulder, left 05/13/2021   Mastitis 04/27/2021   Dysmenorrhea 05/18/2020   Depression with anxiety 04/04/2020   Anxiety state 04/04/2020   Cigarette nicotine dependence without complication 01/04/2018   Past Medical History:  Diagnosis Date   Anxiety and depression    no current medications   Asthma    Endometriosis    Environmental and seasonal allergies    PCOS (polycystic ovarian syndrome)    Seasonal allergies     Family History  Problem Relation Age of Onset   Thyroid disease Mother    Hyperlipidemia Mother    Hypertension Mother    Cataracts Mother    Hyperlipidemia Father    COPD Father    Heart attack Father    Hypertension Father    Diabetes Father    Prostate cancer Maternal Grandfather     Past Surgical History:  Procedure Laterality Date   SHOULDER ARTHROSCOPY WITH LABRAL REPAIR Left 08/11/2021   Procedure: LEFT SHOULDER ARTHROSCOPY WITH LABRAL REPAIR;  Surgeon: Cammy Copa, MD;   Location: [facility] OR;  Service: Orthopedics;  Laterality: Left;   TONSILLECTOMY     TONSILLECTOMY AND ADENOIDECTOMY     Social History   Occupational History   Occupation: ISS    Employer: [employer]  Tobacco Use   Smoking status: Former    Types: Cigarettes   Smokeless tobacco: Never  Vaping Use   Vaping Use: Every day   Last attempt to quit: 08/04/2020   Substances: Nicotine  Substance and Sexual Activity   Alcohol use: Not Currently    Comment: 3 drinks/week, beer or liquor   Drug use: Yes    Frequency: 7.0 times per week    Types: Marijuana   Sexual activity: Yes    Partners: Male    Birth control/protection: Pill

## 2021-12-07 DIAGNOSIS — M5412 Radiculopathy, cervical region: Secondary | ICD-10-CM

## 2021-12-09 ENCOUNTER — Other Ambulatory Visit: Payer: Self-pay | Admitting: Surgical

## 2021-12-09 MED ORDER — METHOCARBAMOL 500 MG PO TABS: 500.0000 mg | ORAL_TABLET | Freq: Three times a day (TID) | ORAL | 0 refills | Status: DC | PRN

## 2021-12-09 MED ORDER — PREDNISONE 10 MG (21) PO TBPK: ORAL_TABLET | ORAL | 0 refills | Status: DC

## 2021-12-21 ENCOUNTER — Other Ambulatory Visit: Payer: Self-pay | Admitting: Surgical

## 2021-12-21 ENCOUNTER — Encounter: Payer: Self-pay | Admitting: *Deleted

## 2021-12-21 MED ORDER — HYDROCODONE-ACETAMINOPHEN 5-325 MG PO TABS: 1.0000 | ORAL_TABLET | Freq: Every day | ORAL | 0 refills | Status: DC

## 2021-12-22 ENCOUNTER — Telehealth: Payer: Self-pay | Admitting: Surgical

## 2021-12-22 ENCOUNTER — Other Ambulatory Visit: Payer: Self-pay | Admitting: Surgical

## 2021-12-22 MED ORDER — HYDROCODONE-ACETAMINOPHEN 5-325 MG PO TABS: 1.0000 | ORAL_TABLET | Freq: Every day | ORAL | 0 refills | Status: DC

## 2021-12-22 NOTE — Telephone Encounter (Signed)
Patient called. Says the pharmacy does not have the hydrocodone, would like it sent to [facility] [place], [address] AT [address]

## 2021-12-22 NOTE — Telephone Encounter (Signed)
Took care of it

## 2021-12-27 ENCOUNTER — Other Ambulatory Visit: Payer: Medicaid Other

## 2022-01-01 ENCOUNTER — Other Ambulatory Visit: Payer: Self-pay | Admitting: Sports Medicine

## 2022-01-01 DIAGNOSIS — M24412 Recurrent dislocation, left shoulder: Secondary | ICD-10-CM

## 2022-01-06 NOTE — Telephone Encounter (Signed)
Can we see why this was denied or work her in for re-eval with Dr August Saucer next week so we can resubmit MRI C-spine if insurance requires another evaluation by a provider before scan approval?

## 2022-01-08 ENCOUNTER — Other Ambulatory Visit: Payer: Medicaid Other

## 2022-01-10 NOTE — Telephone Encounter (Signed)
Yeah I think repeat appointment with Dr August Saucer would be good, can she be seen today? If not, someime in the next week

## 2022-01-20 ENCOUNTER — Ambulatory Visit (INDEPENDENT_AMBULATORY_CARE_PROVIDER_SITE_OTHER): Payer: Medicaid Other | Admitting: Surgical

## 2022-01-20 ENCOUNTER — Other Ambulatory Visit: Payer: Self-pay

## 2022-01-20 DIAGNOSIS — M5412 Radiculopathy, cervical region: Secondary | ICD-10-CM

## 2022-01-21 ENCOUNTER — Encounter: Payer: Self-pay | Admitting: Surgical

## 2022-01-21 NOTE — Progress Notes (Signed)
? ?Office Visit Note ?  ?Patient: Tonya Hart           ?Date of Birth: 02-06-1991           ?MRN: [id number] ?Visit Date: 01/20/2022 ?Requested by: Christen Butter, NP ?[phone] ?[address] ?[place],  [place] [address] ?PCP: Christen Butter, NP ? ?Subjective: ?Chief Complaint  ?Patient presents with  ? Neck - Pain  ? ? ?HPI: Tonya Hart is a 31 y.o. female who presents to the office complaining of neck and shoulder pain.  Patient states that her left shoulder that she had surgery on is actually feeling better in regards to instability.  She has had no instability events since surgery.  Her range of motion has significantly improved and her shoulder feels better in this regard.  However, she continues to complain of similar complaints since her last visit of neck pain with radicular pain down both arms.  She has pain that travels down from her neck to her shoulder blade to her left thumb as well as less frequent right-sided radicular pain down to her right wrist.  She states that pain is much more severe than at her last appointment in January.  She has had to ask family members to help with taking care of her infant daughter.  She is dropping objects every day.  She has also noticed occasional shaking of both hands.  The weakness that she is experiencing in her arms, primarily in her left, is worse than it was in January as well.  She has numbness and tingling that radiates down her arm.  She was referred 9 months ago to see neurology but she states she has not heard anything from any neurology office.  She has no history of diabetes.  She has no neurologic history in her family that she is aware of.  Denies any vision changes, headaches..   ?             ?ROS: All systems reviewed are negative as they relate to the chief complaint within the history of present illness.  Patient denies fevers or chills. ? ?Assessment & Plan: ?Visit Diagnoses:  ?1. Radiculopathy, cervical region   ? ? ?Plan: Patient is a  31 year old female who presents s/p left shoulder labral repair on 08/11/2021.  Her left shoulder instability has significantly improved and she has had no instability events since surgery.  Her range of motion is excellent considering her recent surgery.  However, she continues to complain of numbness and tingling, radicular pain, weakness.  MRI of the cervical spine was ordered but her insurance denied the scan.  With her ongoing symptoms that have not improved since her last visit on November 28, 2021, as well as the weakness that is elucidated on exam today, plan to reorder MRI cervical spine for further evaluation of her weakness and bilateral arm radicular pain.  If MRI of the cervical spine is normal, anticipate referral to neurology for further evaluation.  Patient agreed with plan.  Follow-up after MRI to review results. ? ?Follow-Up Instructions: No follow-ups on file.  ? ?Orders:  ?Orders Placed This Encounter  ?Procedures  ? MR Cervical Spine w/o contrast  ? ?No orders of the defined types were placed in this encounter. ? ? ? ? Procedures: ?No procedures performed ? ? ?Clinical Data: ?No additional findings. ? ?Objective: ?Vital Signs: There were no vitals taken for this visit. ? ?Physical Exam:  ?Constitutional: Patient appears well-developed ?HEENT:  ?Head: Normocephalic ?Eyes:EOM  are normal ?Neck: Normal range of motion ?Cardiovascular: Normal rate ?Pulmonary/chest: Effort normal ?Neurologic: Patient is alert ?Skin: Skin is warm ?Psychiatric: Patient has normal mood and affect ? ?Ortho Exam: Ortho exam demonstrates left shoulder with 50 degrees X rotation, 100 degrees abduction, 1 6 degrees forward flexion.  No apprehension with shoulder range of motion.  Shoulder stable to anterior and posterior directed forces.  Incisions are well-healed.   ? ?Patient does have tenderness over the midportion and inferior aspect of the cervical spine along the midline as well as along the paraspinal musculature on both  sides but primarily the left.  She has positive Spurling sign with reproduced left-sided radicular pain down her arm to her wrist as well as a positive Lhermitte sign.  5/5 motor strength of bilateral grip strength, finger abduction, pronation/supination, tricep, deltoid.  She does have 4/5 biceps strength of the left arm compared with 5/5 bicep strength on the right.  2+ tricep and bicep tendon reflexes bilaterally. ? ?Specialty Comments:  ?No specialty comments available. ? ?Imaging: ?No results found. ? ? ?PMFS History: ?Patient Active Problem List  ? Diagnosis Date Noted  ? Instability of left shoulder joint   ? Recurrent anterior dislocation of shoulder, left 05/13/2021  ? Mastitis 04/27/2021  ? Dysmenorrhea 05/18/2020  ? Depression with anxiety 04/04/2020  ? Anxiety state 04/04/2020  ? Cigarette nicotine dependence without complication 01/04/2018  ? ?Past Medical History:  ?Diagnosis Date  ? Anxiety and depression   ? no current medications  ? Asthma   ? Endometriosis   ? Environmental and seasonal allergies   ? PCOS (polycystic ovarian syndrome)   ? Seasonal allergies   ?  ?Family History  ?Problem Relation Age of Onset  ? Thyroid disease Mother   ? Hyperlipidemia Mother   ? Hypertension Mother   ? Cataracts Mother   ? Hyperlipidemia Father   ? COPD Father   ? Heart attack Father   ? Hypertension Father   ? Diabetes Father   ? Prostate cancer Maternal Grandfather   ?  ?Past Surgical History:  ?Procedure Laterality Date  ? SHOULDER ARTHROSCOPY WITH LABRAL REPAIR Left 08/11/2021  ? Procedure: LEFT SHOULDER ARTHROSCOPY WITH LABRAL REPAIR;  Surgeon: Cammy Copa, MD;  Location: [facility] OR;  Service: Orthopedics;  Laterality: Left;  ? TONSILLECTOMY    ? TONSILLECTOMY AND ADENOIDECTOMY    ? ?Social History  ? ?Occupational History  ? Occupation: ISS  ?  Employer: [employer]  ?Tobacco Use  ? Smoking status: Former  ?  Types: Cigarettes  ? Smokeless tobacco: Never  ?Vaping Use  ? Vaping Use: Every day  ?  Last attempt to quit: 08/04/2020  ? Substances: Nicotine  ?Substance and Sexual Activity  ? Alcohol use: Not Currently  ?  Comment: 3 drinks/week, beer or liquor  ? Drug use: Yes  ?  Frequency: 7.0 times per week  ?  Types: Marijuana  ? Sexual activity: Yes  ?  Partners: Male  ?  Birth control/protection: Pill  ? ? ? ? ?  ?

## 2022-02-07 ENCOUNTER — Other Ambulatory Visit: Payer: Self-pay

## 2022-02-07 ENCOUNTER — Ambulatory Visit
Admission: RE | Admit: 2022-02-07 | Discharge: 2022-02-07 | Disposition: A | Discharge status: Home / Self Care | Payer: Medicaid Other | Source: Ambulatory Visit | Attending: Surgical | Admitting: Surgical

## 2022-02-07 DIAGNOSIS — M5412 Radiculopathy, cervical region: Secondary | ICD-10-CM

## 2022-02-07 IMAGING — MR MR CERVICAL SPINE W/O CM
5 series · 35 of 48 positions shown · non-contrast
Comparison: Cervical radiographs [DATE]

CLINICAL DATA: Cervical radiculopathy.  Bilateral arm weakness

EXAM:
MRI CERVICAL SPINE WITHOUT CONTRAST
TECHNIQUE: Multiplanar, multisequence MR imaging of the cervical spine was
performed. No intravenous contrast was administered.

[Series 2: T2 · sagittal · 3.0mm · 0.47mm/px · 6 of 15 slices shown (1 of 2)]
[im 1/15]
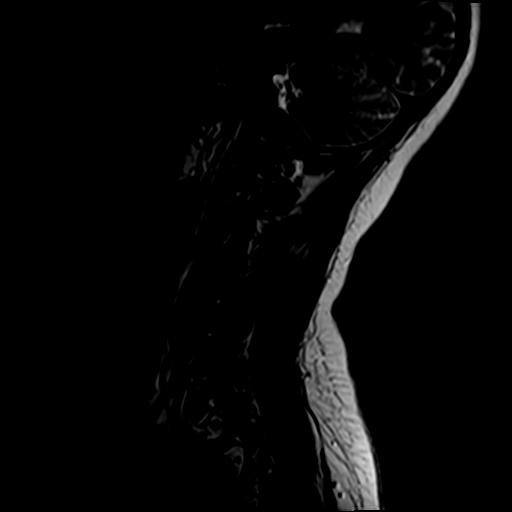
[im 3/15]
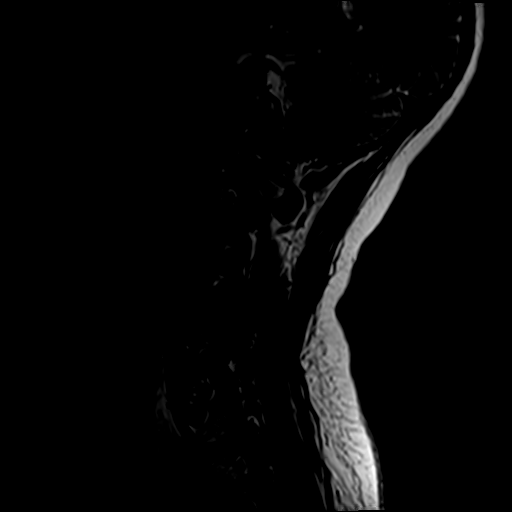
[im 6/15]
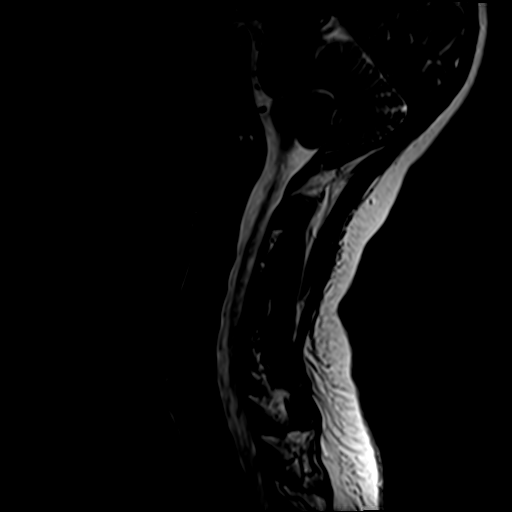
[im 9/15]
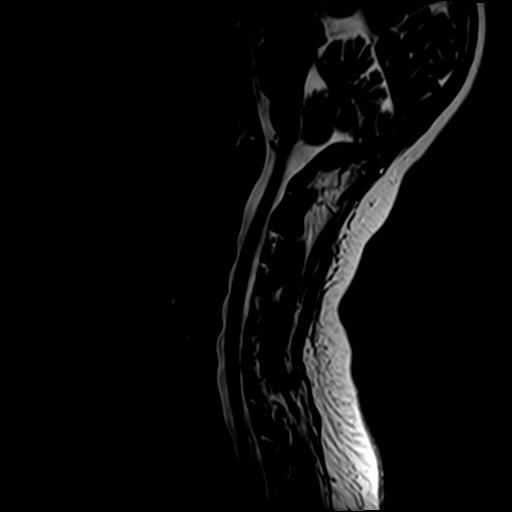
[im 12/15]
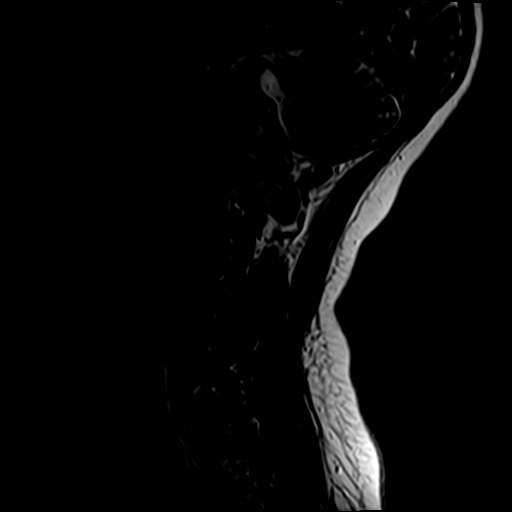
[im 15/15]
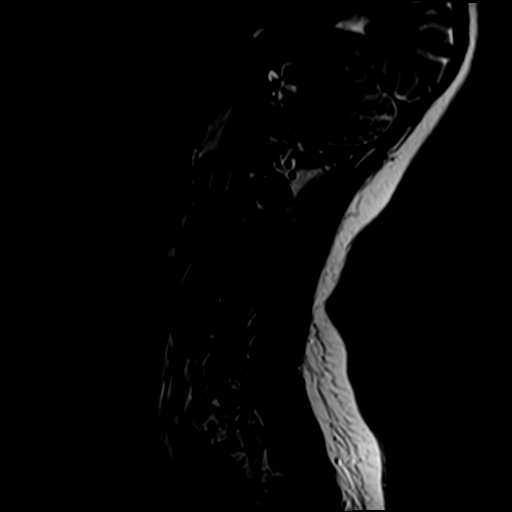

[Series 3: STIR · sagittal · 3.0mm · 0.94mm/px · 7 of 15 slices shown]
[im 1/15]
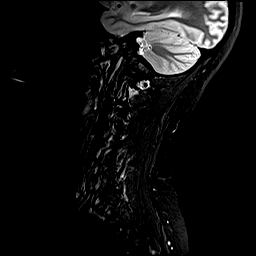
[im 3/15]
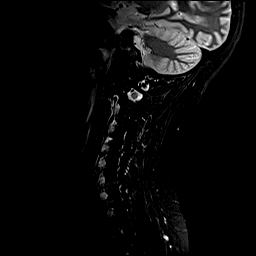
[im 5/15]
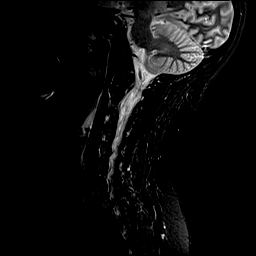
[im 8/15]
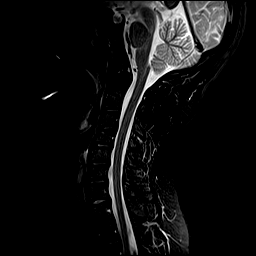
[im 10/15]
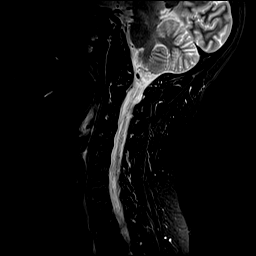
[im 12/15]
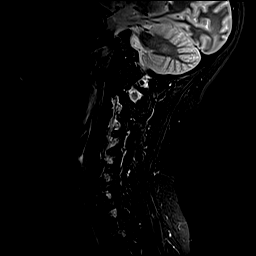
[im 15/15]
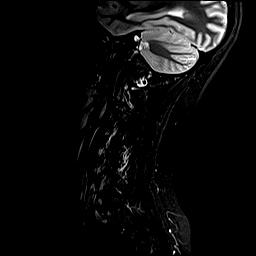

[Series 4: T1 · sagittal · 3.0mm · 0.94mm/px · 7 of 15 slices shown]
[im 1/15]
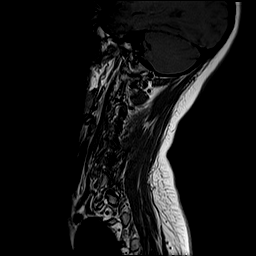
[im 3/15]
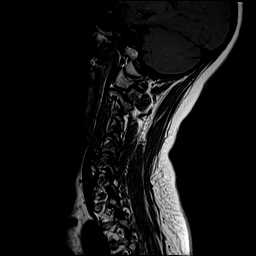
[im 5/15]
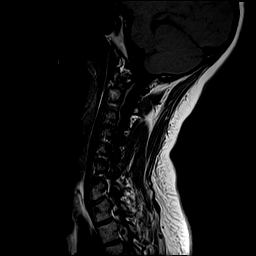
[im 8/15]
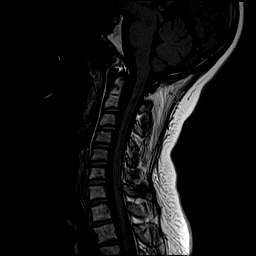
[im 10/15]
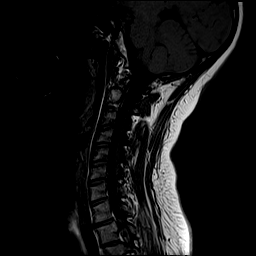
[im 12/15]
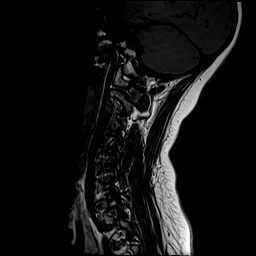
[im 15/15]
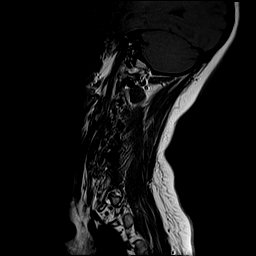

[Series 5: T2 · axial · 3.0mm · 0.70mm/px · z∈[-90,+14]mm · 8 of 29 slices shown (2 of 2)]
[im 1/29]
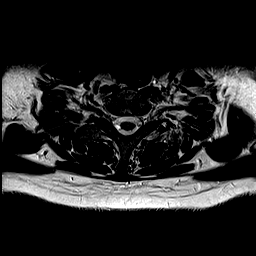
[im 5/29]
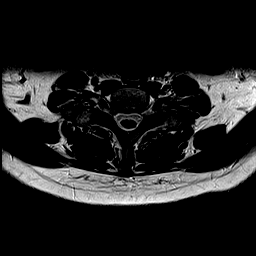
[im 9/29]
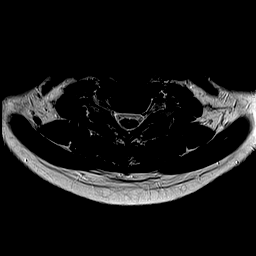
[im 13/29]
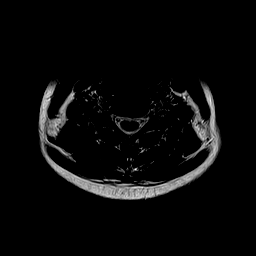
[im 16/29]
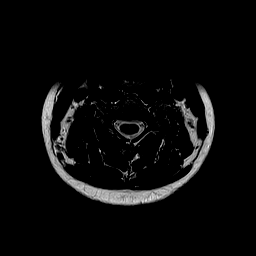
[im 20/29]
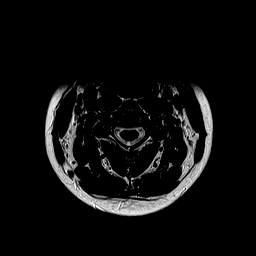
[im 24/29]
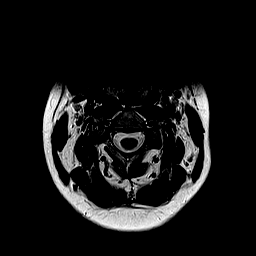
[im 29/29]
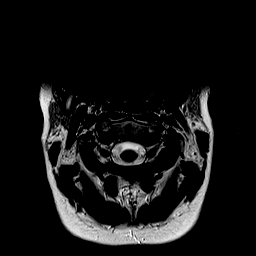

[Series 6: GRE · axial · 3.0mm · 0.35mm/px · z∈[-90,-5]mm · 7 of 29 slices shown]
[im 1/29]
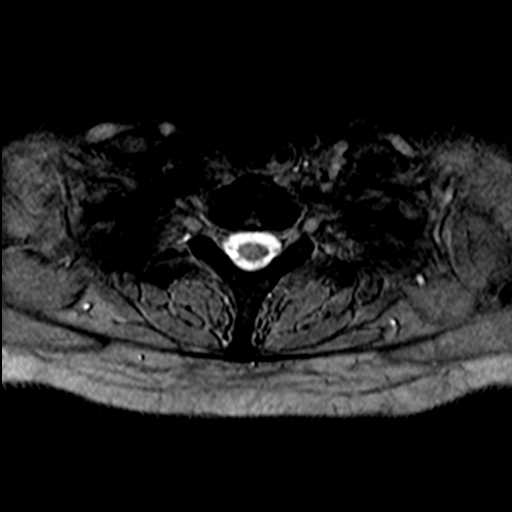
[im 5/29]
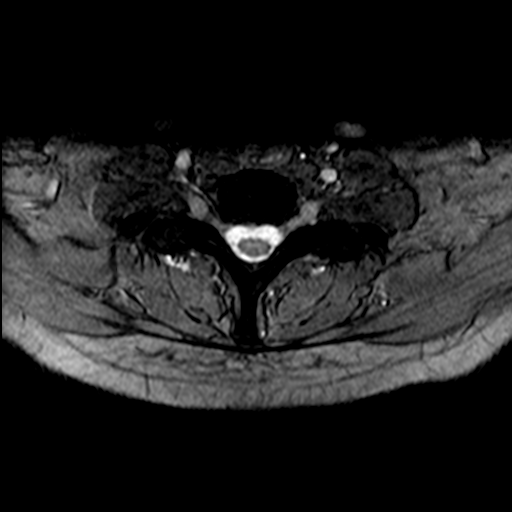
[im 9/29]
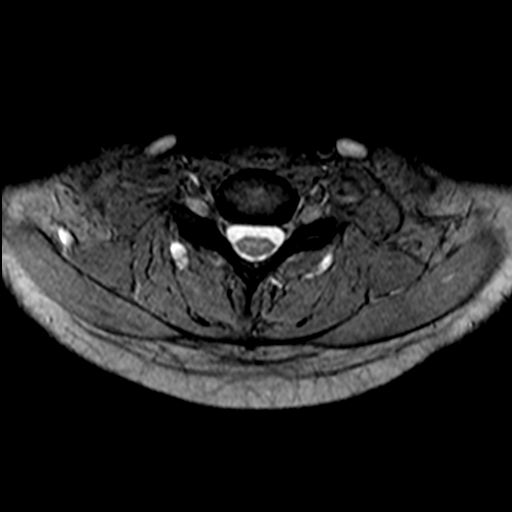
[im 13/29]
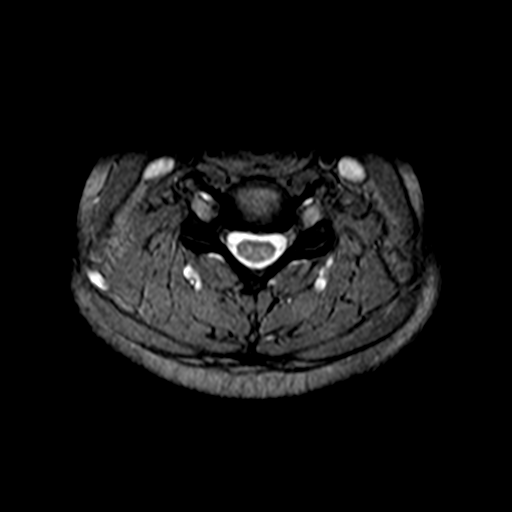
[im 16/29]
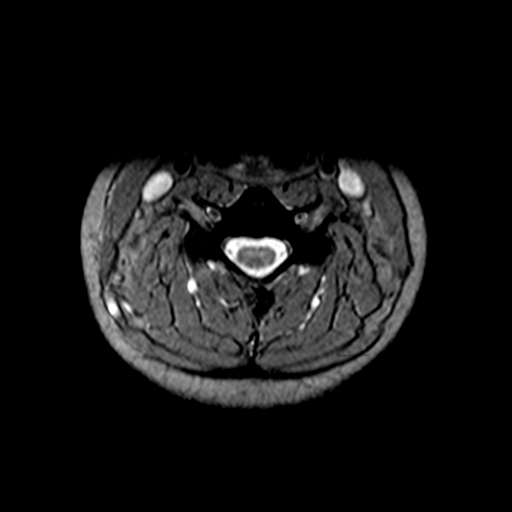
[im 20/29]
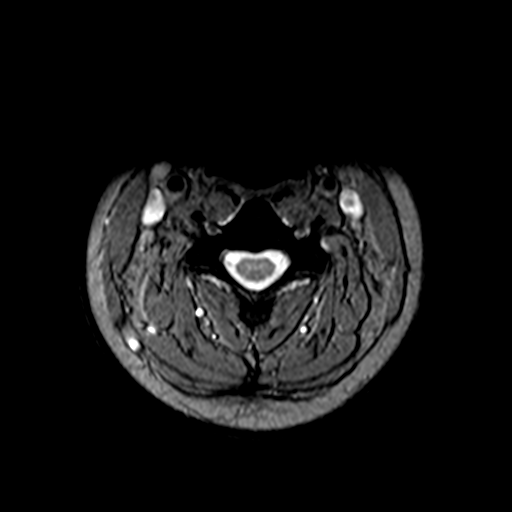
[im 24/29]
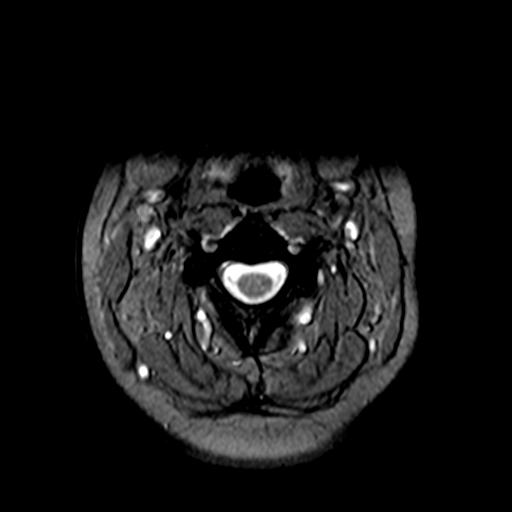

[35 of 48 positions shown; findings below may reference images not displayed]

FINDINGS: Alignment: Normal

Vertebrae: Normal bone marrow.  Negative for fracture or mass.

Cord: Normal signal and morphology.  No cord compression.

Posterior Fossa, vertebral arteries, paraspinal tissues: Negative

Disc levels:

Spinal canal generous in size. No spinal or foraminal stenosis.
Small central disc protrusion at C7-T1 without stenosis. Mild disc
bulging at C4-5 and C5-6.
IMPRESSION: Mild cervical degenerative change. Negative for spinal or foraminal
stenosis. No neural impingement identified.

## 2022-02-09 NOTE — Progress Notes (Signed)
Can we set her up with neurology referral or check on the status of her current referral? She told me she was referred ot neurology about 9 months ago and with her MRI scan and persistent weakness/radicular pain, think she should see a neurologist.  We can talk to her about it at her appointment but I think it would be good to get the process started now

## 2022-02-10 ENCOUNTER — Encounter: Payer: Self-pay | Admitting: Neurology

## 2022-02-10 ENCOUNTER — Other Ambulatory Visit: Payer: Self-pay

## 2022-02-10 DIAGNOSIS — M5412 Radiculopathy, cervical region: Secondary | ICD-10-CM

## 2022-02-16 ENCOUNTER — Ambulatory Visit (INDEPENDENT_AMBULATORY_CARE_PROVIDER_SITE_OTHER): Payer: Medicaid Other | Admitting: Orthopedic Surgery

## 2022-02-16 ENCOUNTER — Encounter: Payer: Self-pay | Admitting: Orthopedic Surgery

## 2022-02-16 DIAGNOSIS — M5412 Radiculopathy, cervical region: Secondary | ICD-10-CM

## 2022-02-16 DIAGNOSIS — M79602 Pain in left arm: Secondary | ICD-10-CM | POA: Diagnosis not present

## 2022-02-16 DIAGNOSIS — M79605 Pain in left leg: Secondary | ICD-10-CM | POA: Diagnosis not present

## 2022-02-16 DIAGNOSIS — R251 Tremor, unspecified: Secondary | ICD-10-CM

## 2022-02-16 DIAGNOSIS — M79601 Pain in right arm: Secondary | ICD-10-CM | POA: Diagnosis not present

## 2022-02-16 DIAGNOSIS — M79604 Pain in right leg: Secondary | ICD-10-CM | POA: Diagnosis not present

## 2022-02-16 DIAGNOSIS — H538 Other visual disturbances: Secondary | ICD-10-CM

## 2022-02-16 NOTE — Progress Notes (Signed)
? ?Office Visit Note ?  ?Patient: Tonya Hart           ?Date of Birth: February 23, 1991           ?MRN: [id number] ?Visit Date: 02/16/2022 ?Requested by: Christen Butter, NP ?[phone] ?[address] ?[place],  [place] [address] ?PCP: Christen Butter, NP ? ?Subjective: ?Chief Complaint  ?Patient presents with  ? Other  ?   ?Scan review  ? ? ?HPI: Tonya Hart is a 31 y.o. female who presents to the office for MRI review.  She does note worsening symptoms with worsening intensity of the bilateral arm radicular pain that she has been experiencing.  She also notes increased tremors in both hands.  She has constant numbness in the left arm versus the right where she feels a constant dull sensation to stimuli on the left but not as much on the right.  She also has begun to notice leg symptoms with increased tingling down both legs.  Numbness and tingling down the right leg stops at the knee.  Numbness and tingling down the left leg extends to the bottom of the foot.  Left pain bothers her more than the right pain.  She also has weakness in the left leg hip flexor region..  She has begun to notice blurry vision several weeks ago where she will have bouts of blurriness as if she does not have her glasses on.  Denies any double vision.  She is also had increased frequency of headaches and increased sensitivity of light to the point where she has had to have blackout curtains in her house.  In the same timeframe she is continue to notice increased thoracic and lumbar spine pain, primarily in the thoracic spine.  Denies any incontinence of her bowel or bladder.  She does note difficulty with intercourse.  Overall these constellation of symptoms have been worsening over the last several weeks to months.  C-spine MRI is reviewed with the patient and it shows minimal to no degenerative changes and no clear explanation that could account for her neurological symptoms ? ?MRI results revealed: ?MR Cervical Spine w/o contrast ? ?Result  Date: 02/07/2022 ?CLINICAL DATA:  Cervical radiculopathy.  Bilateral arm weakness EXAM: MRI CERVICAL SPINE WITHOUT CONTRAST TECHNIQUE: Multiplanar, multisequence MR imaging of the cervical spine was performed. No intravenous contrast was administered. COMPARISON:  Cervical radiographs 11/28/2021 FINDINGS: Alignment: Normal Vertebrae: Normal bone marrow.  Negative for fracture or mass. Cord: Normal signal and morphology.  No cord compression. Posterior Fossa, vertebral arteries, paraspinal tissues: Negative Disc levels: Spinal canal generous in size. No spinal or foraminal stenosis. Small central disc protrusion at C7-T1 without stenosis. Mild disc bulging at C4-5 and C5-6. IMPRESSION: Mild cervical degenerative change. Negative for spinal or foraminal stenosis. No neural impingement identified. Electronically Signed   By: Marlan Palau M.D.   On: 02/07/2022 18:36   ? ?             ?ROS: All systems reviewed are negative as they relate to the chief complaint within the history of present illness.  Patient denies fevers or chills. ? ?Assessment & Plan: ?Visit Diagnoses:  ?1. Radiculopathy, cervical region   ?2. Bilateral arm pain   ?3. Bilateral leg pain   ?4. Occasional tremors   ?5. Blurred vision   ? ? ?Plan: [name] is a 30 y.o. female who presents to the office for evaluation of MRI of the cervical spine.  This demonstrated mild degenerative changes with no  significant stenosis of the spinal canal or foramen.  With her continued symptoms that are progressively worsening and continued vision changes as well as progression to leg symptoms now, plan to order MRI of the brain for further evaluation of neurologic problems such as multiple sclerosis.  Blood samples obtained today for evaluation of B12 and folate deficiency.  Also ordered urgent MRI of the thoracic spine and lumbar spine for evaluation of left leg weakness and increased back pain.  Follow-up after MRIs to review results.  New patient neuro  referrals not available until September. ? ?Follow-Up Instructions: No follow-ups on file.  ? ?Orders:  ?Orders Placed This Encounter  ?Procedures  ? MR BRAIN WO CONTRAST  ? MR Thoracic Spine w/o contrast  ? MR Lumbar Spine w/o contrast  ? B12 and Folate Panel  ? ?No orders of the defined types were placed in this encounter. ? ? ? ? Procedures: ?No procedures performed ? ? ?Clinical Data: ?No additional findings. ? ?Objective: ?Vital Signs: There were no vitals taken for this visit. ? ?Physical Exam:  ?Constitutional: Patient appears well-developed ?HEENT:  ?Head: Normocephalic ?Eyes:EOM are normal ?Neck: Normal range of motion ?Cardiovascular: Normal rate ?Pulmonary/chest: Effort normal ?Neurologic: Patient is alert ?Skin: Skin is warm ?Psychiatric: Patient has normal mood and affect ? ?Ortho Exam: Ortho exam demonstrates tremors of both hands where both hands are shaking constantly.  She has 4/5 motor strength of bilateral grip strength, supination, pronation, bicep flexion.  5/5 motor strength of bilateral tricep and deltoid.  4/5 motor strength of left hip flexion, quadricep, hamstring, plantarflexion.  3/5 dorsiflexion strength.  Tenderness throughout the axial thoracic spine around the level of the bra line.  No tenderness throughout the cervical spine today.  She does have tenderness over the lumbar spine axially.  No dermatomal asymmetric paresthesias in the upper or lower extremities.  Negative clonus. ? ?Specialty Comments:  ?No specialty comments available. ? ?Imaging: ?No results found. ? ? ?PMFS History: ?Patient Active Problem List  ? Diagnosis Date Noted  ? Instability of left shoulder joint   ? Recurrent anterior dislocation of shoulder, left 05/13/2021  ? Mastitis 04/27/2021  ? Dysmenorrhea 05/18/2020  ? Depression with anxiety 04/04/2020  ? Anxiety state 04/04/2020  ? Cigarette nicotine dependence without complication 01/04/2018  ? ?Past Medical History:  ?Diagnosis Date  ? Anxiety and depression    ? no current medications  ? Asthma   ? Endometriosis   ? Environmental and seasonal allergies   ? PCOS (polycystic ovarian syndrome)   ? Seasonal allergies   ?  ?Family History  ?Problem Relation Age of Onset  ? Thyroid disease Mother   ? Hyperlipidemia Mother   ? Hypertension Mother   ? Cataracts Mother   ? Hyperlipidemia Father   ? COPD Father   ? Heart attack Father   ? Hypertension Father   ? Diabetes Father   ? Prostate cancer Maternal Grandfather   ?  ?Past Surgical History:  ?Procedure Laterality Date  ? SHOULDER ARTHROSCOPY WITH LABRAL REPAIR Left 08/11/2021  ? Procedure: LEFT SHOULDER ARTHROSCOPY WITH LABRAL REPAIR;  Surgeon: Cammy Copaean, Haji Delaine Scott, MD;  [facility];  Service: Orthopedics;  Laterality: Left;  ? TONSILLECTOMY    ? TONSILLECTOMY AND ADENOIDECTOMY    ? ?Social History  ? ?Occupational History  ? Occupation: ISS  ?  Employer: [employer]  ?Tobacco Use  ? Smoking status: Former  ?  Types: Cigarettes  ? Smokeless tobacco: Never  ?Vaping Use  ?  Vaping Use: Every day  ? Last attempt to quit: 08/04/2020  ? Substances: Nicotine  ?Substance and Sexual Activity  ? Alcohol use: Not Currently  ?  Comment: 3 drinks/week, beer or liquor  ? Drug use: Yes  ?  Frequency: 7.0 times per week  ?  Types: Marijuana  ? Sexual activity: Yes  ?  Partners: Male  ?  Birth control/protection: Pill  ? ? ? ? ?   ?

## 2022-02-17 LAB — B12 AND FOLATE PANEL
Folate: 18.5 ng/mL
Vitamin B-12: 329 pg/mL (ref 200–1100)

## 2022-02-17 LAB — EXTRA LAV TOP TUBE

## 2022-02-17 NOTE — Progress Notes (Signed)
Labs okj pls clala htx

## 2022-02-22 ENCOUNTER — Ambulatory Visit
Admission: RE | Admit: 2022-02-22 | Discharge: 2022-02-22 | Disposition: A | Discharge status: Home / Self Care | Payer: Medicaid Other | Source: Ambulatory Visit | Attending: Orthopedic Surgery | Admitting: Orthopedic Surgery

## 2022-02-22 DIAGNOSIS — M79604 Pain in right leg: Secondary | ICD-10-CM

## 2022-02-22 DIAGNOSIS — R251 Tremor, unspecified: Secondary | ICD-10-CM

## 2022-02-22 DIAGNOSIS — H538 Other visual disturbances: Secondary | ICD-10-CM

## 2022-02-22 DIAGNOSIS — R42 Dizziness and giddiness: Secondary | ICD-10-CM | POA: Diagnosis not present

## 2022-02-22 DIAGNOSIS — M79601 Pain in right arm: Secondary | ICD-10-CM

## 2022-02-22 DIAGNOSIS — R519 Headache, unspecified: Secondary | ICD-10-CM | POA: Diagnosis not present

## 2022-02-22 IMAGING — MR MR HEAD W/O CM
12 series · 48 of 48 positions shown · non-contrast
Comparison: None.

CLINICAL DATA: Dizziness, headaches, and blurred vision, evaluate
for multiple sclerosis.

EXAM:
MRI HEAD WITHOUT CONTRAST
TECHNIQUE: Multiplanar, multiecho pulse sequences of the brain and surrounding
structures were obtained without intravenous contrast.

[Series 2: T1 · sagittal · 5.0mm · 0.45mm/px · 2 of 21 slices shown]
[im 1/21]
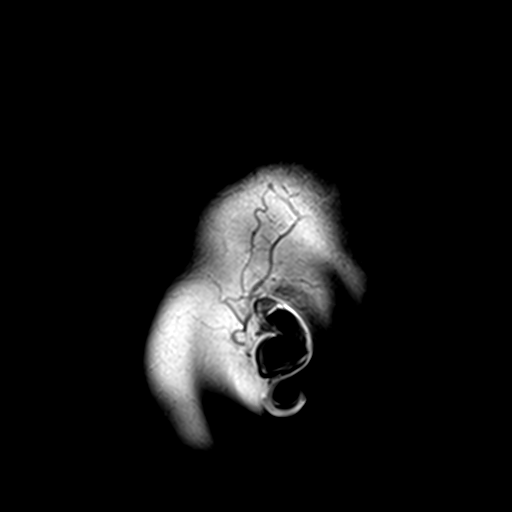
[im 21/21]
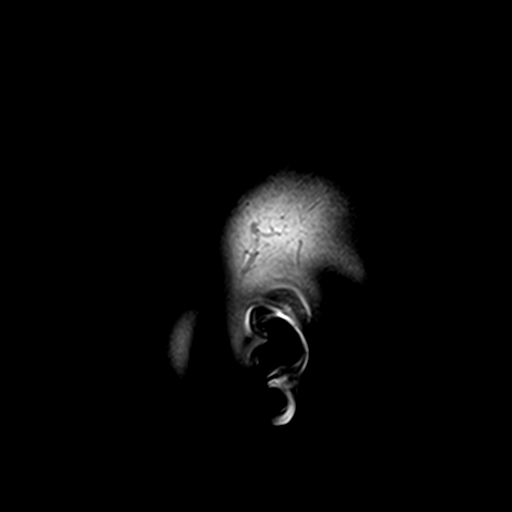

[Series 3: DWI · axial · 3.0mm · 1.80mm/px · z∈[-69,+78]mm · 8 of 99 slices shown (1 of 4)]
[im 1/99]
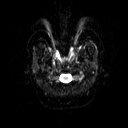
[im 15/99]
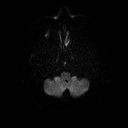
[im 29/99]
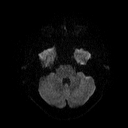
[im 43/99]
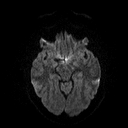
[im 57/99]
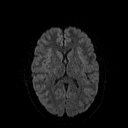
[im 71/99]
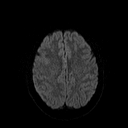
[im 85/99]
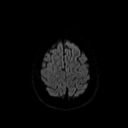
[im 99/99]
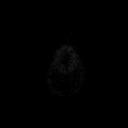

[Series 4: DWI · axial · 3.0mm · 1.80mm/px · z∈[-69,+78]mm · 4 of 50 slices shown (2 of 4)]
[im 1/50]
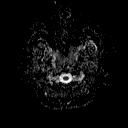
[im 17/50]
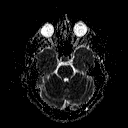
[im 33/50]
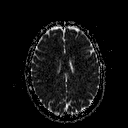
[im 50/50]
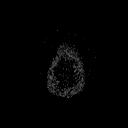

[Series 5: DWI · coronal · 5.0mm · 1.80mm/px · 6 of 68 slices shown (3 of 4)]
[im 1/68]
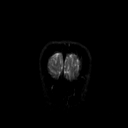
[im 14/68]
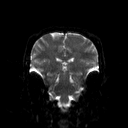
[im 27/68]
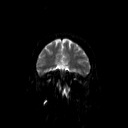
[im 41/68]
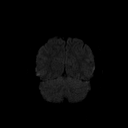
[im 54/68]
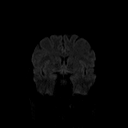
[im 68/68]
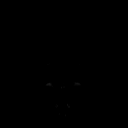

[Series 6: DWI · coronal · 5.0mm · 1.80mm/px · 3 of 34 slices shown (4 of 4)]
[im 1/34]
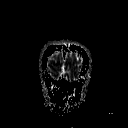
[im 17/34]
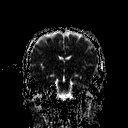
[im 34/34]
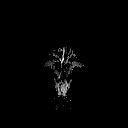

[Series 7: T2 · axial · 5.0mm · 0.51mm/px · z∈[-70,+77]mm · 2 of 22 slices shown (1 of 2)]
[im 1/22]
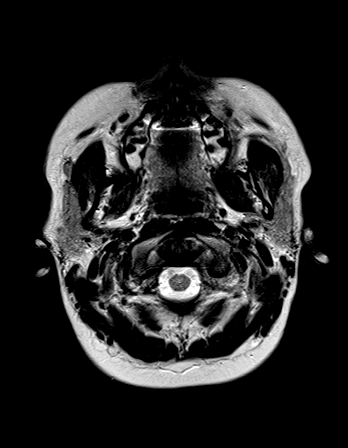
[im 22/22]
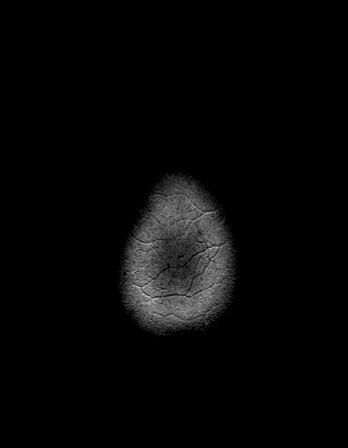

[Series 8: FLAIR · axial · 3.0mm · 0.45mm/px · z∈[-63,+72]mm · 2 of 30 slices shown (1 of 2)]
[im 1/30]
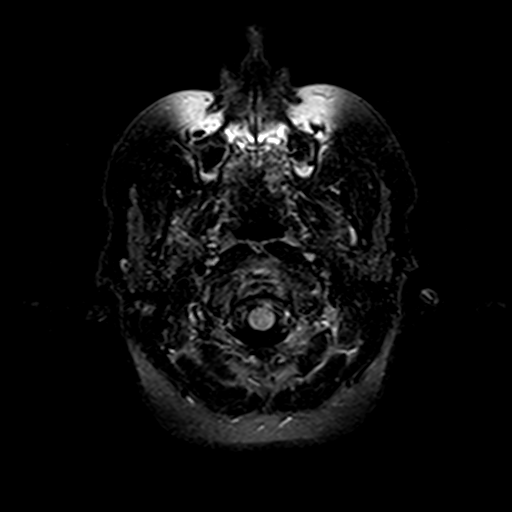
[im 30/30]
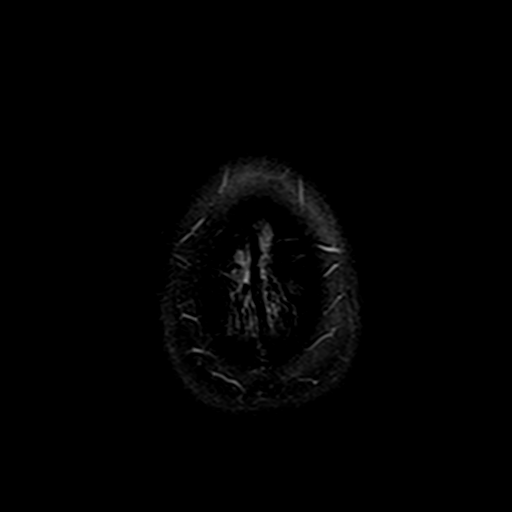

[Series 9: mip_images(sw) · axial · 32.0mm · 0.90mm/px · z∈[-52,+60]mm · 2 of 29 slices shown]
[im 1/29]
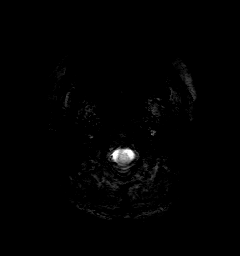
[im 29/29]
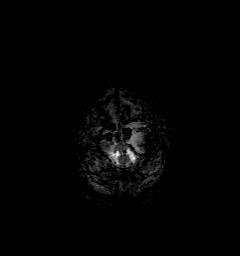

[Series 10: swi_images · axial · 4.0mm · 0.90mm/px · z∈[-66,+74]mm · 3 of 36 slices shown]
[im 1/36]
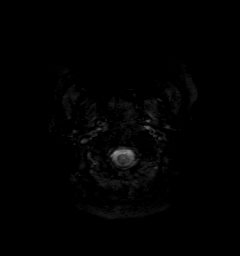
[im 18/36]
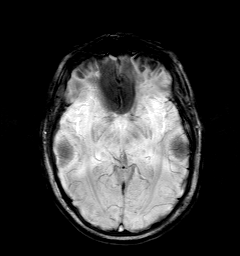
[im 36/36]
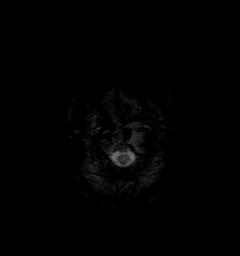

[Series 11: t1_mpr_tra · axial · 1.0mm · 0.71mm/px · z∈[-68,+75]mm · 12 of 144 slices shown]
[im 1/144]
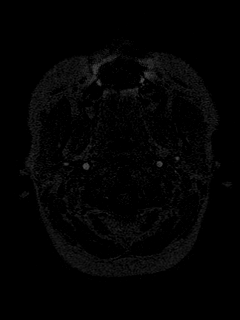
[im 14/144]
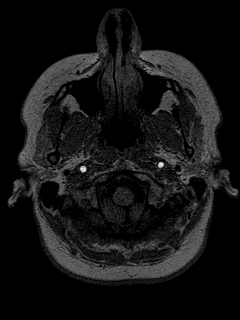
[im 27/144]
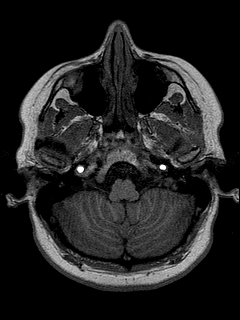
[im 40/144]
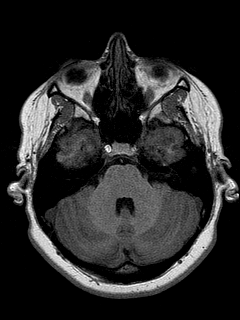
[im 53/144]
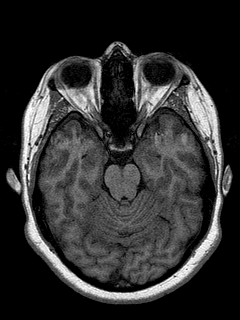
[im 66/144]
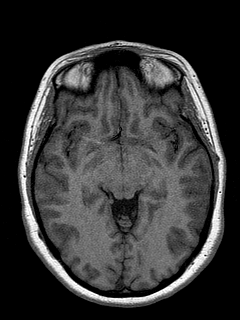
[im 79/144]
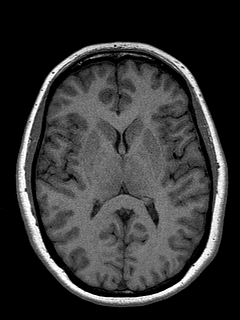
[im 92/144]
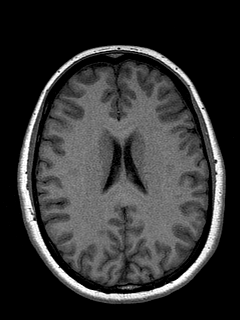
[im 105/144]
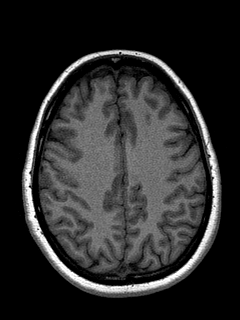
[im 118/144]
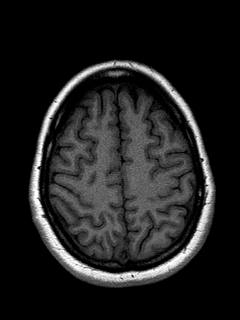
[im 131/144]
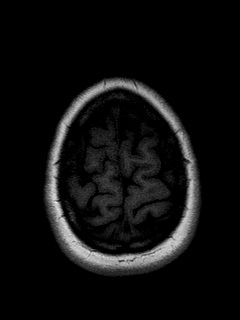
[im 144/144]
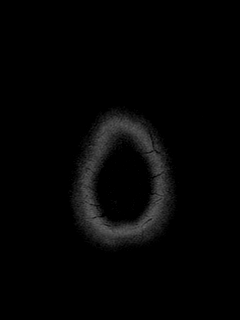

[Series 12: T2 · coronal · 5.0mm · 0.45mm/px · 2 of 25 slices shown (2 of 2)]
[im 1/25]
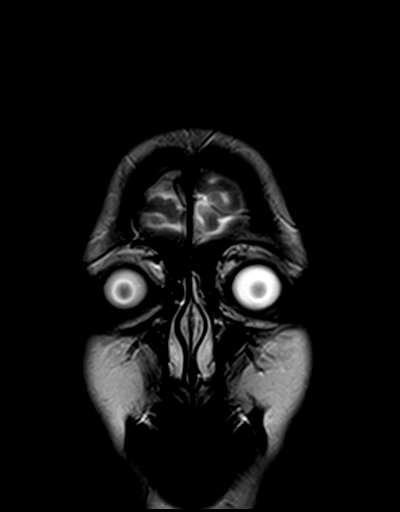
[im 25/25]
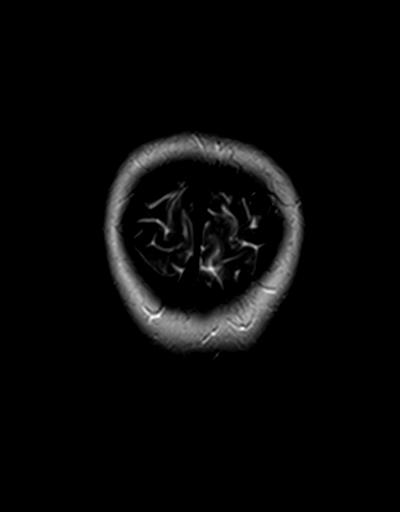

[Series 13: FLAIR · sagittal · 5.0mm · 0.45mm/px · 2 of 25 slices shown (2 of 2)]
[im 1/25]
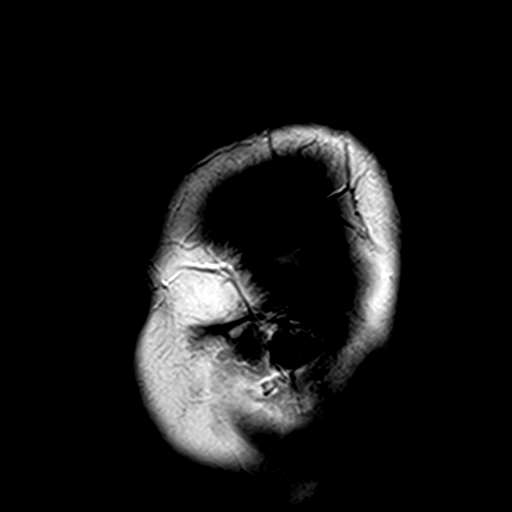
[im 25/25]
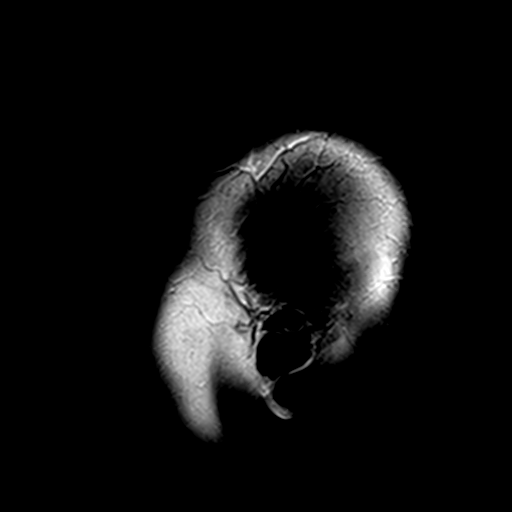

[48 of 48 positions shown; findings below may reference images not displayed]

FINDINGS: Brain: No infarction, hemorrhage, hydrocephalus, extra-axial
collection or mass lesion. No white matter disease or brain atrophy

Vascular: Normal flow voids

Skull and upper cervical spine: Normal marrow signal

Sinuses/Orbits: Retention cyst in the right maxillary sinus which is
small. Clear mastoid and middle ear spaces.
IMPRESSION: Normal MRI of the brain.

## 2022-02-23 NOTE — Progress Notes (Signed)
I called and left message on her machine.  If she remains symptomatic then we can consider further work-up but for now with no MS I think it would be good just to keep her appointment with neurology in September.

## 2022-02-27 ENCOUNTER — Ambulatory Visit
Admission: RE | Admit: 2022-02-27 | Discharge: 2022-02-27 | Disposition: A | Discharge status: Home / Self Care | Payer: Medicaid Other | Source: Ambulatory Visit | Attending: Orthopedic Surgery | Admitting: Orthopedic Surgery

## 2022-02-27 DIAGNOSIS — M40204 Unspecified kyphosis, thoracic region: Secondary | ICD-10-CM | POA: Diagnosis not present

## 2022-02-27 DIAGNOSIS — M79605 Pain in left leg: Secondary | ICD-10-CM

## 2022-02-27 DIAGNOSIS — M5127 Other intervertebral disc displacement, lumbosacral region: Secondary | ICD-10-CM | POA: Diagnosis not present

## 2022-02-27 IMAGING — MR MR LUMBAR SPINE W/O CM
4 of 5 series · 17 of 48 positions shown · non-contrast
Comparison: None available.

CLINICAL DATA: Initial evaluation for mid back pain radiation to
the bilateral upper and lower extremities with numbness and weakness
for 4 months.

EXAM:
MRI THORACIC AND LUMBAR SPINE WITHOUT CONTRAST
TECHNIQUE: Multiplanar and multiecho pulse sequences of the thoracic and lumbar
spine were obtained without intravenous contrast.

[Series 5: T2 · sagittal · 4.0mm · 0.73mm/px · 6 of 15 slices shown (1 of 2)]
[im 1/15]
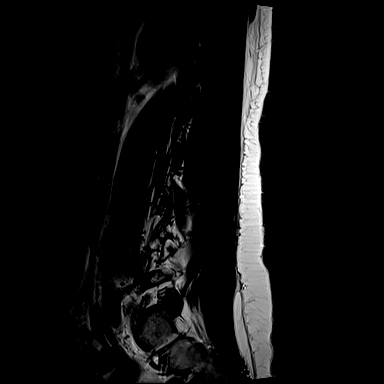
[im 3/15]
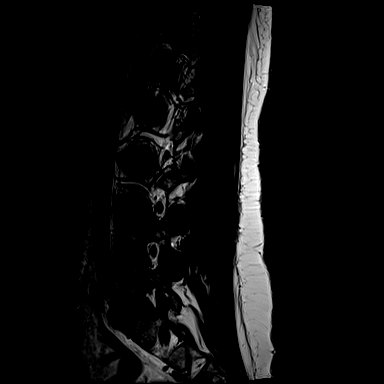
[im 6/15]
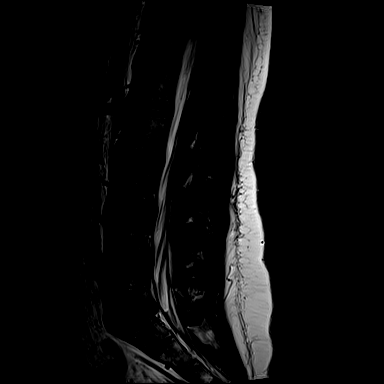
[im 9/15]
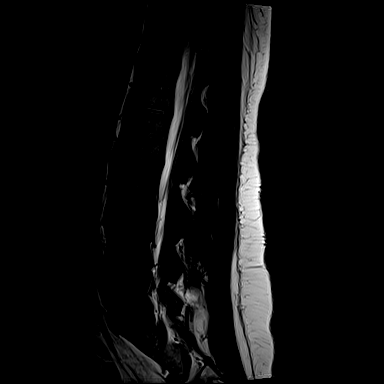
[im 12/15]
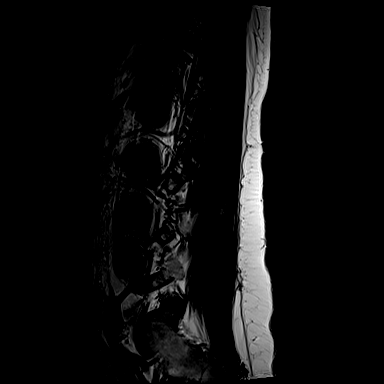
[im 15/15]
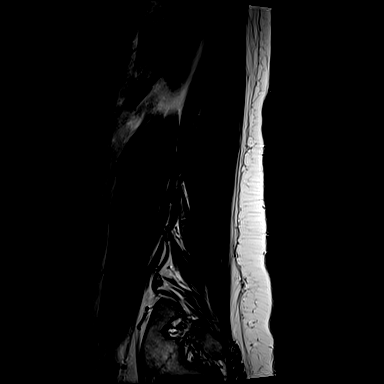

[Series 6: T1 · sagittal · 4.0mm · 0.73mm/px · 3 of 15 slices shown (1 of 2)]
[im 3/15]
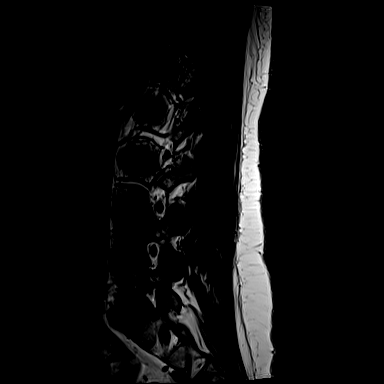
[im 9/15]
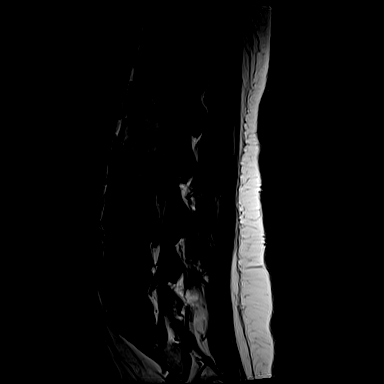
[im 15/15]
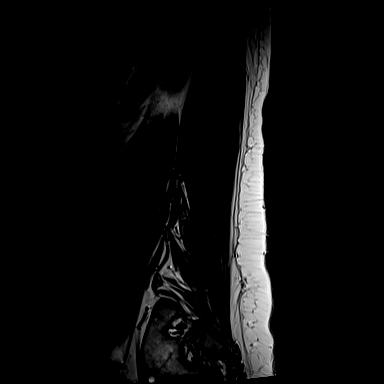

[Series 10: T1 · axial · 4.0mm · 0.28mm/px · z∈[-83,+74]mm · 3 of 39 slices shown (2 of 2)]
[im 6/39]
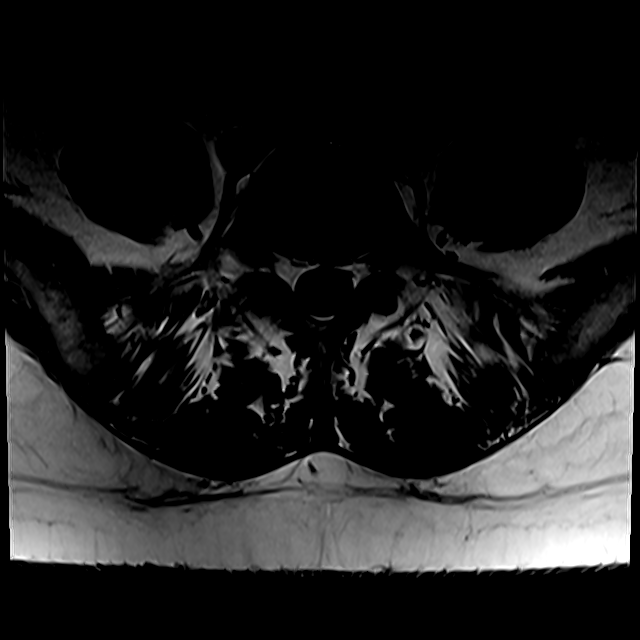
[im 20/39]
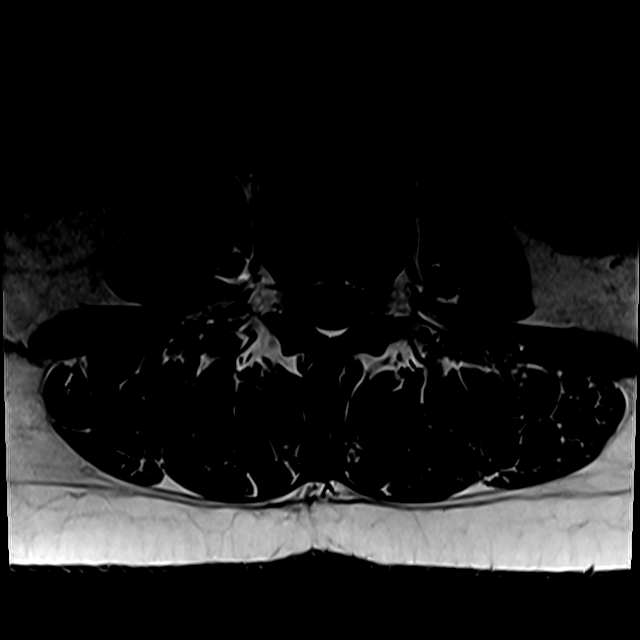
[im 33/39]
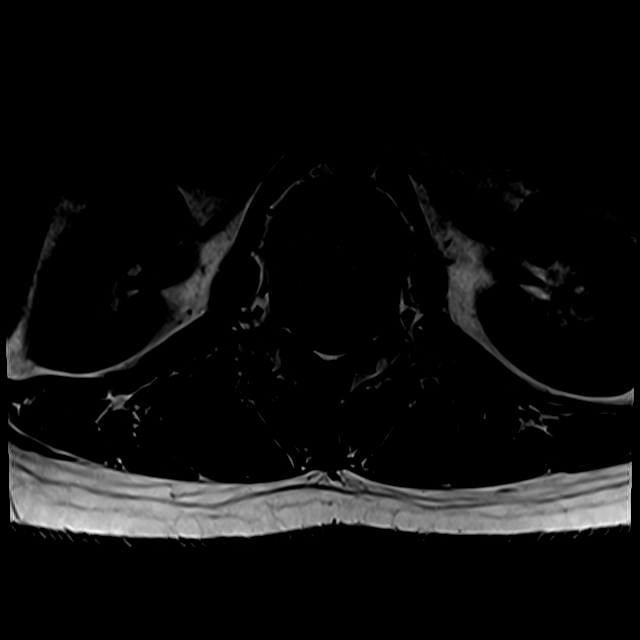

[Series 13: T2 · axial · 4.0mm · 0.28mm/px · z∈[-108,+74]mm · 5 of 39 slices shown (2 of 2)]
[im 1/39]
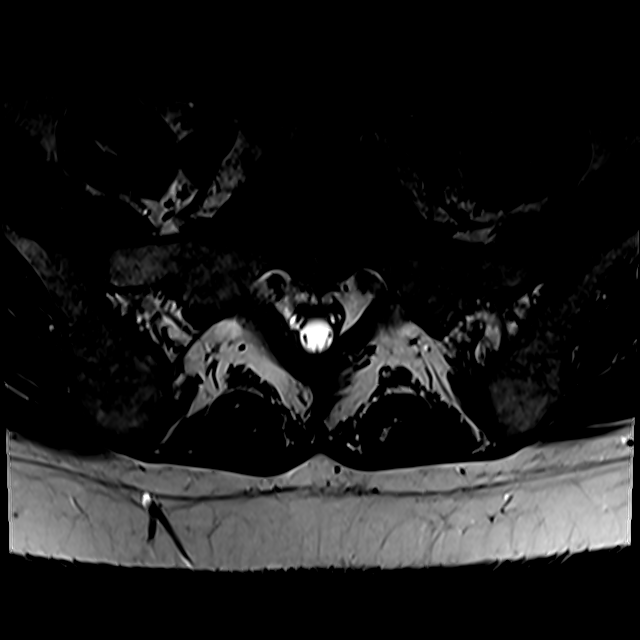
[im 6/39]
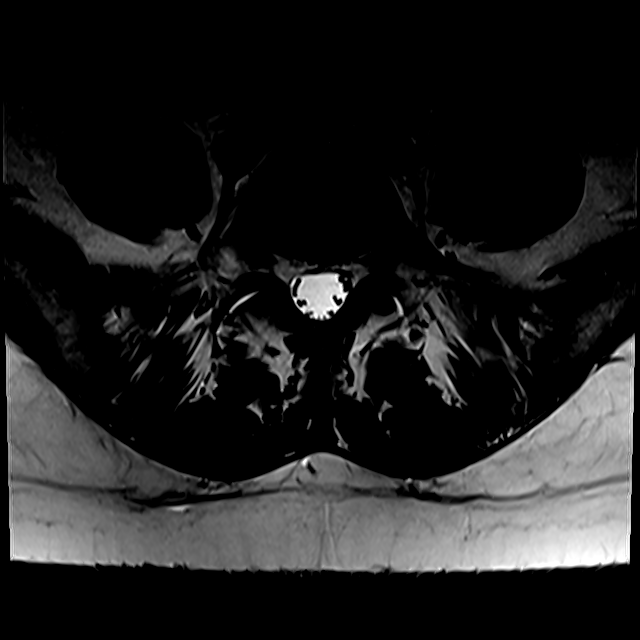
[im 11/39]
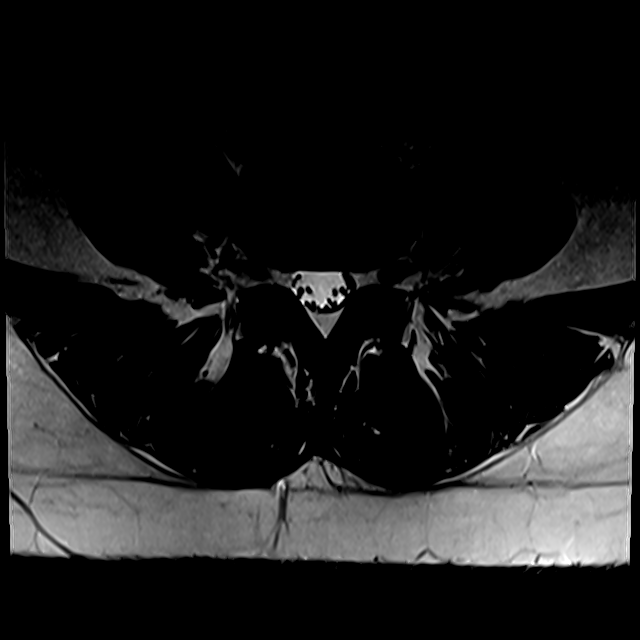
[im 20/39]
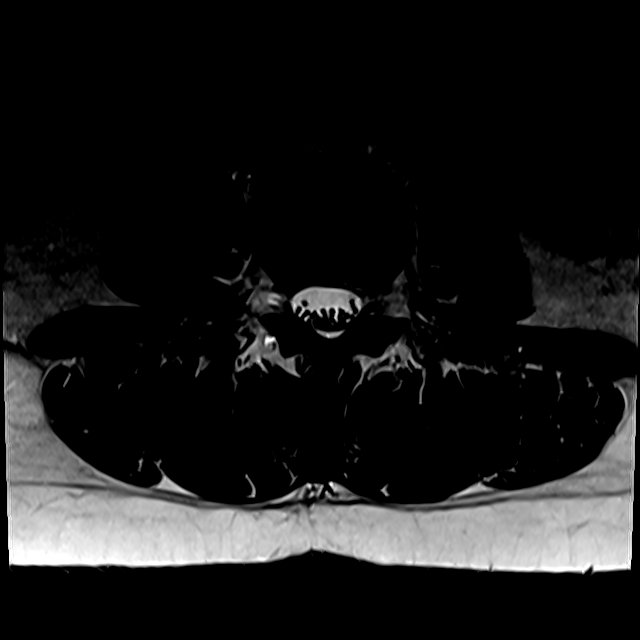
[im 33/39]
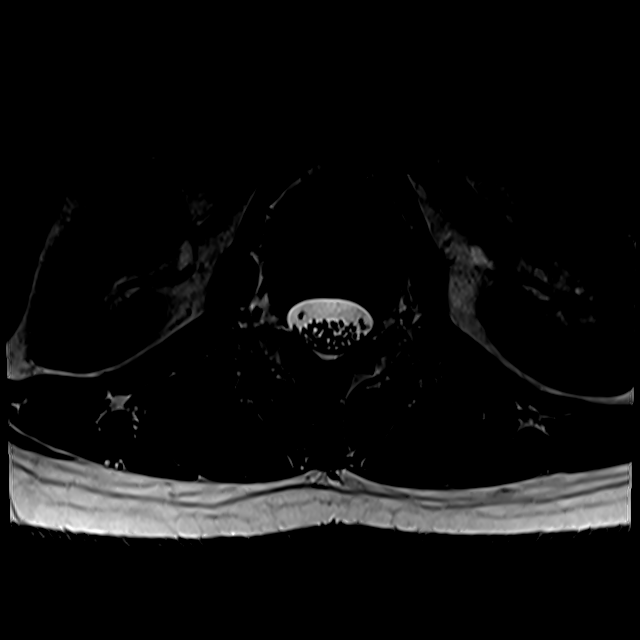

[17 of 48 positions shown; findings below may reference images not displayed]

FINDINGS: MRI THORACIC SPINE FINDINGS

Alignment: Physiologic with preservation of the normal thoracic
kyphosis. No listhesis.

Vertebrae: Vertebral body height maintained without acute or chronic
fracture. Few scattered chronic endplate Schmorl's node deformities
noted within the mid and lower thoracic spine. Bone marrow signal
intensity within normal limits without discrete or worrisome osseous
lesion. No abnormal marrow edema.

Cord:  Normal signal and morphology.

Paraspinal and other soft tissues: Unremarkable.

Disc levels:

C7-T1-small right paracentral disc protrusion indents the ventral
thecal sac (series 20, image 2). No significant spinal stenosis or
cord deformity. Foramina remain patent.

T2-3: Shallow right paracentral disc protrusion with endplate
spurring. Mild flattening of the right ventral thecal sac with
minimal flattening of the right ventral cord (series 20, image 8).
No cord signal changes or significant spinal stenosis. Foramina
remain patent. Otherwise, no other significant disc pathology seen
elsewhere within the thoracic spine. Intervertebral discs are well
hydrated with preserved disc height. No other significant disc bulge
or focal disc herniation. No spinal stenosis. Foramina are widely
patent.

MRI LUMBAR SPINE FINDINGS

Segmentation: Standard. Lowest well-formed disc space labeled the
L5-S1 level.

Alignment: Physiologic with preservation of the normal lumbar
lordosis. No listhesis.

Vertebrae: Vertebral body height maintained without acute or chronic
fracture. Bone marrow signal intensity within normal limits. No
discrete or worrisome osseous lesions or abnormal marrow edema.

Conus medullaris and cauda equina: Conus extends to the L1 level.
Conus and cauda equina appear normal.

Paraspinal and other soft tissues: Unremarkable.

Disc levels:

L1-2: Normal interspace. Mild facet hypertrophy. No canal or
foraminal stenosis.

L2-3: Normal interspace. Mild facet hypertrophy. No canal or
foraminal stenosis.

L3-4: Minimal endplate spurring without significant disc bulge or
focal disc herniation. Mild facet hypertrophy. No canal or foraminal
stenosis.

L4-5: Normal interspace. Mild facet hypertrophy. No canal or
foraminal stenosis.

L5-S1: Mild disc bulge with disc desiccation. Shallow broad-based
central disc protrusion with annular fissure (series 13, image 36).
Associated mild reactive endplate spurring. Mild facet hypertrophy.
No significant canal or lateral recess stenosis. Foramina remain
patent.
IMPRESSION: MR THORACIC SPINE IMPRESSION:

1. Small right paracentral disc protrusions at C7-T1 and T2-3
without significant stenosis or overt neural impingement.
2. Otherwise unremarkable MRI of the thoracic spine. No other
significant disc pathology, stenosis or neural impingement.

MR LUMBAR SPINE IMPRESSION:

1. Shallow broad-based central disc protrusion at L5-S1 without
stenosis or neural impingement.
2. Mild bilateral facet hypertrophy throughout the lumbar spine,
which could contribute to underlying back pain.

## 2022-02-27 IMAGING — MR MR THORACIC SPINE W/O CM
4 of 6 series · 19 of 48 positions shown · non-contrast
Comparison: None available.

CLINICAL DATA: Initial evaluation for mid back pain radiation to
the bilateral upper and lower extremities with numbness and weakness
for 4 months.

EXAM:
MRI THORACIC AND LUMBAR SPINE WITHOUT CONTRAST
TECHNIQUE: Multiplanar and multiecho pulse sequences of the thoracic and lumbar
spine were obtained without intravenous contrast.

[Series 17: T1 · sagittal · 3.0mm · 0.89mm/px · 3 of 15 slices shown]
[im 1/15]
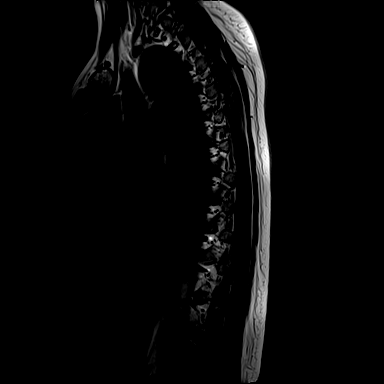
[im 8/15]
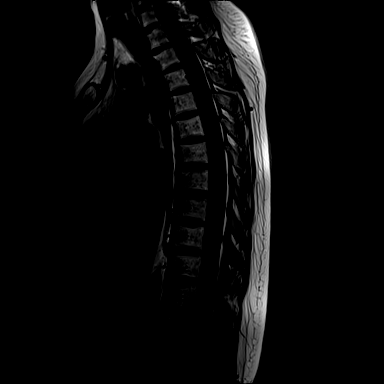
[im 15/15]
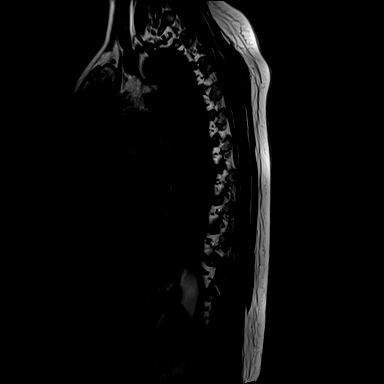

[Series 18: STIR · sagittal · 3.0mm · 0.89mm/px · 3 of 15 slices shown]
[im 1/15]
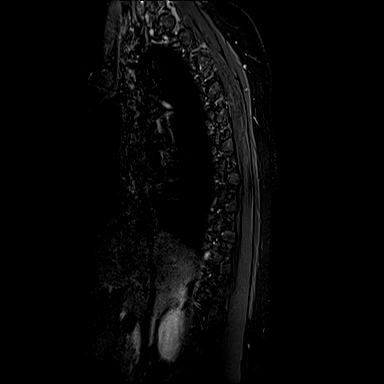
[im 8/15]
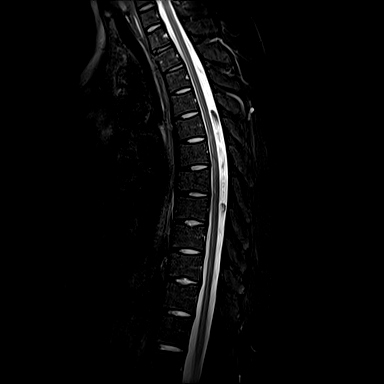
[im 15/15]
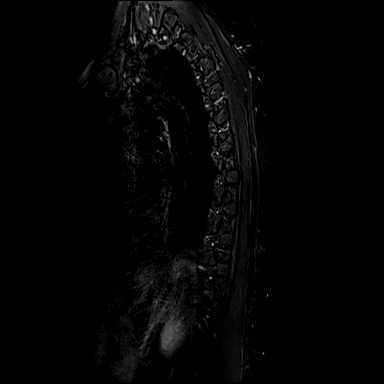

[Series 19: T2 · sagittal · 3.0mm · 0.89mm/px · 5 of 15 slices shown (1 of 2)]
[im 1/15]
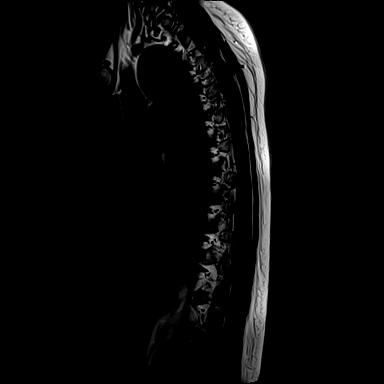
[im 4/15]
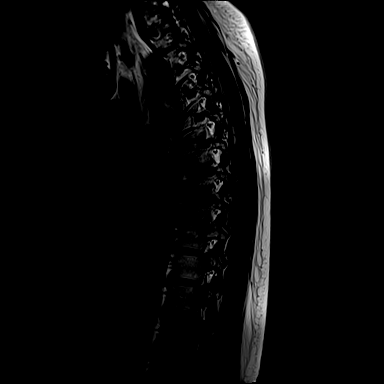
[im 8/15]
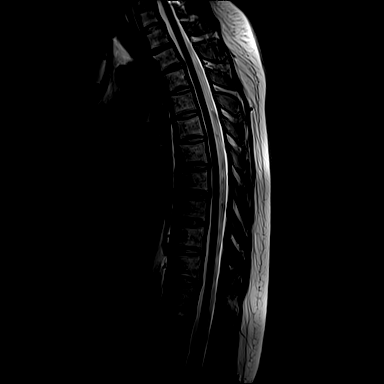
[im 11/15]
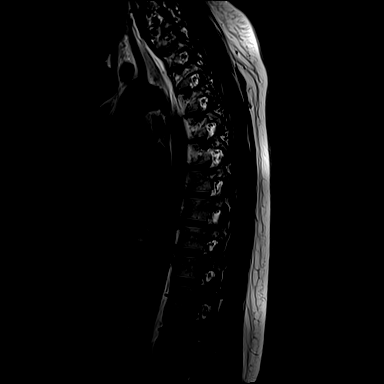
[im 15/15]
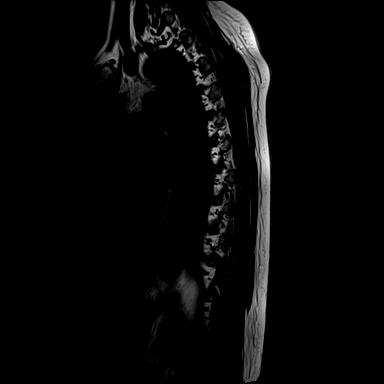

[Series 20: T2 · axial · 4.0mm · 0.28mm/px · z∈[-310,-66]mm · 8 of 39 slices shown (2 of 2)]
[im 1/39]
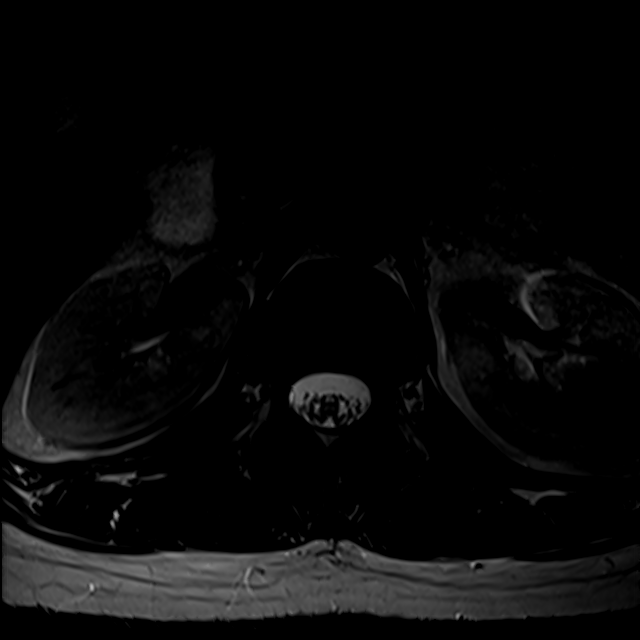
[im 6/39]
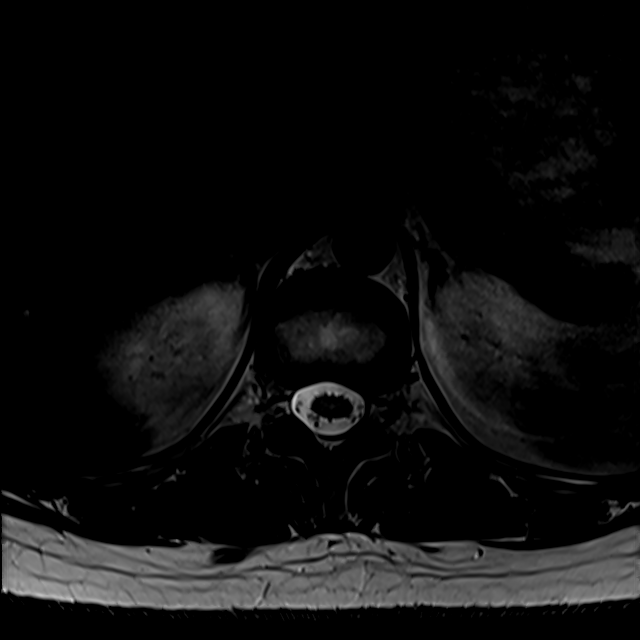
[im 12/39]
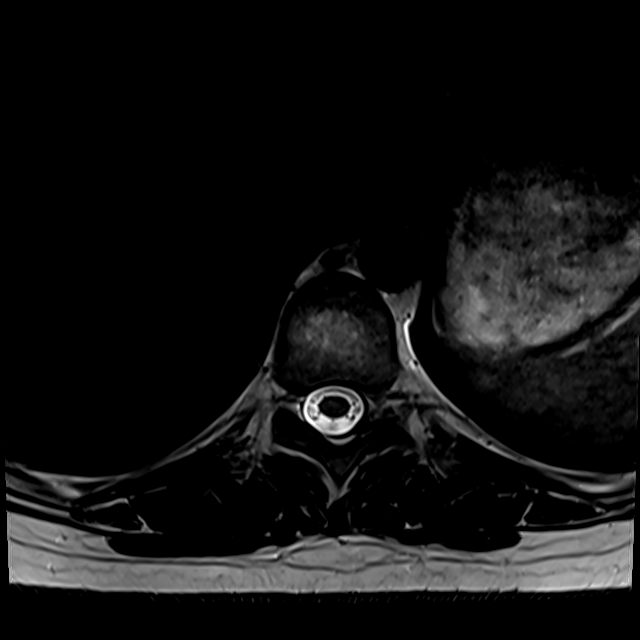
[im 18/39]
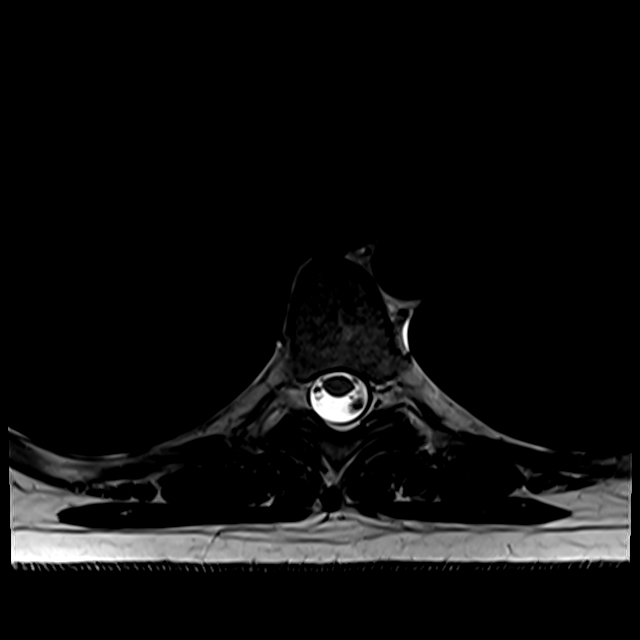
[im 21/39]
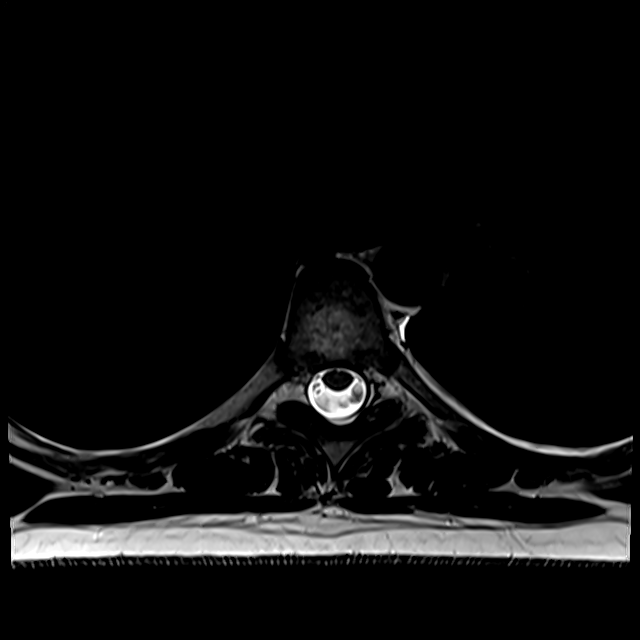
[im 27/39]
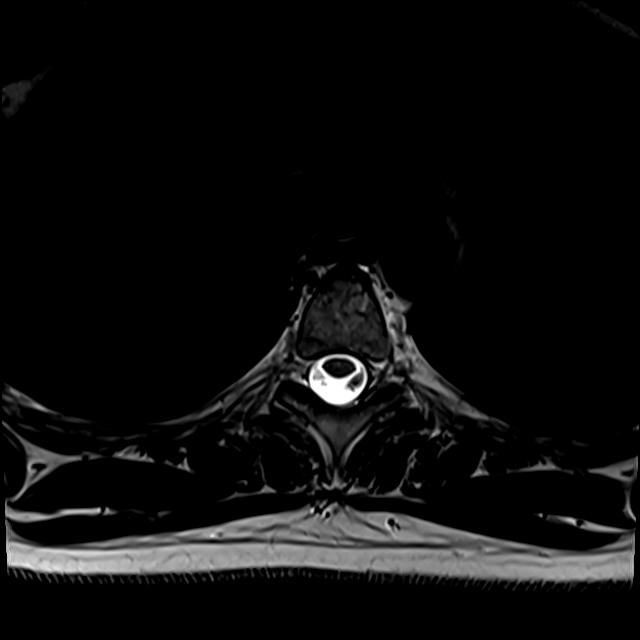
[im 33/39]
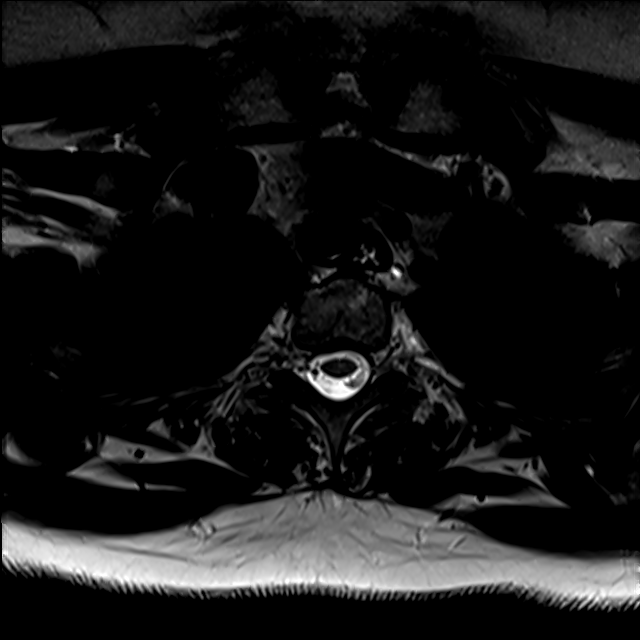
[im 39/39]
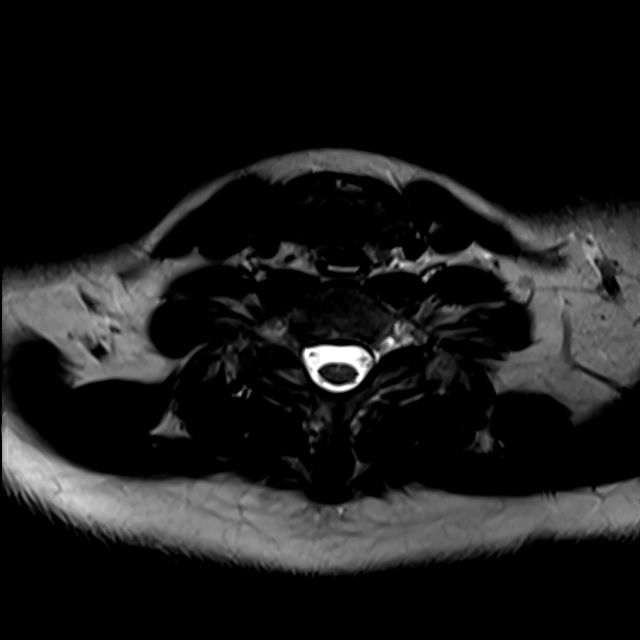

[19 of 48 positions shown; findings below may reference images not displayed]

FINDINGS: MRI THORACIC SPINE FINDINGS

Alignment: Physiologic with preservation of the normal thoracic
kyphosis. No listhesis.

Vertebrae: Vertebral body height maintained without acute or chronic
fracture. Few scattered chronic endplate Schmorl's node deformities
noted within the mid and lower thoracic spine. Bone marrow signal
intensity within normal limits without discrete or worrisome osseous
lesion. No abnormal marrow edema.

Cord:  Normal signal and morphology.

Paraspinal and other soft tissues: Unremarkable.

Disc levels:

C7-T1-small right paracentral disc protrusion indents the ventral
thecal sac (series 20, image 2). No significant spinal stenosis or
cord deformity. Foramina remain patent.

T2-3: Shallow right paracentral disc protrusion with endplate
spurring. Mild flattening of the right ventral thecal sac with
minimal flattening of the right ventral cord (series 20, image 8).
No cord signal changes or significant spinal stenosis. Foramina
remain patent. Otherwise, no other significant disc pathology seen
elsewhere within the thoracic spine. Intervertebral discs are well
hydrated with preserved disc height. No other significant disc bulge
or focal disc herniation. No spinal stenosis. Foramina are widely
patent.

MRI LUMBAR SPINE FINDINGS

Segmentation: Standard. Lowest well-formed disc space labeled the
L5-S1 level.

Alignment: Physiologic with preservation of the normal lumbar
lordosis. No listhesis.

Vertebrae: Vertebral body height maintained without acute or chronic
fracture. Bone marrow signal intensity within normal limits. No
discrete or worrisome osseous lesions or abnormal marrow edema.

Conus medullaris and cauda equina: Conus extends to the L1 level.
Conus and cauda equina appear normal.

Paraspinal and other soft tissues: Unremarkable.

Disc levels:

L1-2: Normal interspace. Mild facet hypertrophy. No canal or
foraminal stenosis.

L2-3: Normal interspace. Mild facet hypertrophy. No canal or
foraminal stenosis.

L3-4: Minimal endplate spurring without significant disc bulge or
focal disc herniation. Mild facet hypertrophy. No canal or foraminal
stenosis.

L4-5: Normal interspace. Mild facet hypertrophy. No canal or
foraminal stenosis.

L5-S1: Mild disc bulge with disc desiccation. Shallow broad-based
central disc protrusion with annular fissure (series 13, image 36).
Associated mild reactive endplate spurring. Mild facet hypertrophy.
No significant canal or lateral recess stenosis. Foramina remain
patent.
IMPRESSION: MR THORACIC SPINE IMPRESSION:

1. Small right paracentral disc protrusions at C7-T1 and T2-3
without significant stenosis or overt neural impingement.
2. Otherwise unremarkable MRI of the thoracic spine. No other
significant disc pathology, stenosis or neural impingement.

MR LUMBAR SPINE IMPRESSION:

1. Shallow broad-based central disc protrusion at L5-S1 without
stenosis or neural impingement.
2. Mild bilateral facet hypertrophy throughout the lumbar spine,
which could contribute to underlying back pain.

## 2022-03-10 ENCOUNTER — Ambulatory Visit (INDEPENDENT_AMBULATORY_CARE_PROVIDER_SITE_OTHER): Payer: Medicaid Other | Admitting: Orthopedic Surgery

## 2022-03-10 DIAGNOSIS — M79601 Pain in right arm: Secondary | ICD-10-CM

## 2022-03-10 DIAGNOSIS — M5412 Radiculopathy, cervical region: Secondary | ICD-10-CM

## 2022-03-10 DIAGNOSIS — M79605 Pain in left leg: Secondary | ICD-10-CM | POA: Diagnosis not present

## 2022-03-10 DIAGNOSIS — M79604 Pain in right leg: Secondary | ICD-10-CM | POA: Diagnosis not present

## 2022-03-10 DIAGNOSIS — M79602 Pain in left arm: Secondary | ICD-10-CM

## 2022-03-10 MED ORDER — ACETAMINOPHEN-CODEINE #3 300-30 MG PO TABS: ORAL_TABLET | ORAL | 0 refills | Status: DC

## 2022-03-11 ENCOUNTER — Encounter: Payer: Self-pay | Admitting: Orthopedic Surgery

## 2022-03-11 NOTE — Progress Notes (Signed)
? ?Office Visit Note ?  ?Patient: Tonya Hart           ?Date of Birth: Dec 31, 1990           ?MRN: [id number] ?Visit Date: 03/10/2022 ?Requested by: Christen Butter, NP ?[phone] ?[address] ?[place],  [place] [address] ?PCP: Christen Butter, NP ? ?Subjective: ?Chief Complaint  ?Patient presents with  ? Other  ?   ?Scan review  ? ? ?HPI: Tonya Hart is a 31 year old patient with multiple orthopedic complaints.  She presents now for follow-up of C-spine MRI T-spine MRI L-spine MRI and brain MRI.  Brain MRI was negative for MS.  C-spine T-spine and L-spine MRIs show pretty minimal symptoms and nerve compression.  Those are all reviewed with the patient today.  At C4-5 and C5-6 there is mild disc bulging.  There is a shallow broad-based central disc protrusion at L5-S1 without stenosis or neural impingement.  Overall the patient has a lot of scapular pain.  She has a very small T2 thoracic disc bulge which does not appear to contact or compress the spinal cord in any way.  Cannot see neurology until September.  Hard for her to carry her daughter.  Has back spasms and pain on a daily basis.  Cannot walk for more than an hour.  Takes Lexapro.  She uses marijuana as well for her symptoms.  Gabapentin helps some.  Tramadol no help.  Restoril no help. ?             ?ROS: All systems reviewed are negative as they relate to the chief complaint within the history of present illness.  Patient denies  fevers or chills. ? ? ?Assessment & Plan: ?Visit Diagnoses:  ?1. Radiculopathy, cervical region   ?2. Bilateral arm pain   ?3. Bilateral leg pain   ? ? ?Plan: Impression is nonstructural pain generation throughout her body.  No MS.  No gross nerve compression.  The symptoms she may be having do not appear to have a structural treatable orthopedic cause which we can identify.  Recommend neurology and/or primary care follow-up for further evaluation and management.  Also refer to pain management for these issues.  Tylenol 3 prescribed 1  time only did try to help her at night. ? ?Follow-Up Instructions: No follow-ups on file.  ? ?Orders:  ?Orders Placed This Encounter  ?Procedures  ? Ambulatory referral to Pain Clinic  ? ?Meds ordered this encounter  ?Medications  ? acetaminophen-codeine (TYLENOL #3) 300-30 MG tablet  ?  Sig: 1 po q hs prn pain  ?  Dispense:  30 tablet  ?  Refill:  0  ? ? ? ? Procedures: ?No procedures performed ? ? ?Clinical Data: ?No additional findings. ? ?Objective: ?Vital Signs: There were no vitals taken for this visit. ? ?Physical Exam:  ? ?Constitutional: Patient appears well-developed ?HEENT:  ?Head: Normocephalic ?Eyes:EOM are normal ?Neck: Normal range of motion ?Cardiovascular: Normal rate ?Pulmonary/chest: Effort normal ?Neurologic: Patient is alert ?Skin: Skin is warm ?Psychiatric: Patient has normal mood and affect ? ? ?Ortho Exam: Ortho exam unchanged from prior visit.  In general she has pretty functional range of motion of her wrist elbows shoulders as well as knees and hips.  Some paresthesias are present episodically on the right and left-hand side.  There is some pain with forward lateral bending. ? ?Specialty Comments:  ?No specialty comments available. ? ?Imaging: ?No results found. ? ? ?PMFS History: ?Patient Active Problem List  ? Diagnosis Date  Noted  ? Instability of left shoulder joint   ? Recurrent anterior dislocation of shoulder, left 05/13/2021  ? Mastitis 04/27/2021  ? Dysmenorrhea 05/18/2020  ? Depression with anxiety 04/04/2020  ? Anxiety state 04/04/2020  ? Cigarette nicotine dependence without complication 01/04/2018  ? ?Past Medical History:  ?Diagnosis Date  ? Anxiety and depression   ? no current medications  ? Asthma   ? Endometriosis   ? Environmental and seasonal allergies   ? PCOS (polycystic ovarian syndrome)   ? Seasonal allergies   ?  ?Family History  ?Problem Relation Age of Onset  ? Thyroid disease Mother   ? Hyperlipidemia Mother   ? Hypertension Mother   ? Cataracts Mother   ?  Hyperlipidemia Father   ? COPD Father   ? Heart attack Father   ? Hypertension Father   ? Diabetes Father   ? Prostate cancer Maternal Grandfather   ?  ?Past Surgical History:  ?Procedure Laterality Date  ? SHOULDER ARTHROSCOPY WITH LABRAL REPAIR Left 08/11/2021  ? Procedure: LEFT SHOULDER ARTHROSCOPY WITH LABRAL REPAIR;  Surgeon: Cammy Copa, MD;  Location: [facility];  Service: Orthopedics;  Laterality: Left;  ? TONSILLECTOMY    ? TONSILLECTOMY AND ADENOIDECTOMY    ? ?Social History  ? ?Occupational History  ? Occupation: ISS  ?  Employer: [employer]  ?Tobacco Use  ? Smoking status: Former  ?  Types: Cigarettes  ? Smokeless tobacco: Never  ?Vaping Use  ? Vaping Use: Every day  ? Last attempt to quit: 08/04/2020  ? Substances: Nicotine  ?Substance and Sexual Activity  ? Alcohol use: Not Currently  ?  Comment: 3 drinks/week, beer or liquor  ? Drug use: Yes  ?  Frequency: 7.0 times per week  ?  Types: Marijuana  ? Sexual activity: Yes  ?  Partners: Male  ?  Birth control/protection: Pill  ? ? ? ? ?I is working in ?

## 2022-03-14 ENCOUNTER — Encounter: Payer: Self-pay | Admitting: Physical Medicine & Rehabilitation

## 2022-04-25 ENCOUNTER — Encounter: Payer: Self-pay | Admitting: Family Medicine

## 2022-04-25 ENCOUNTER — Ambulatory Visit: Payer: Medicaid Other | Admitting: Family Medicine

## 2022-04-25 VITALS — BP 123/80 | HR 80 | Resp 16 | Ht 69.0 in | Wt 191.0 lb

## 2022-04-25 DIAGNOSIS — K649 Unspecified hemorrhoids: Secondary | ICD-10-CM | POA: Insufficient documentation

## 2022-04-25 MED ORDER — HYDROCORTISONE (PERIANAL) 2.5 % EX CREA: 1.0000 "application " | TOPICAL_CREAM | Freq: Two times a day (BID) | CUTANEOUS | 2 refills | Status: DC

## 2022-04-25 NOTE — Assessment & Plan Note (Signed)
Gust diagnosis.  Recommend warm sitz bath.  Gentle cleansing of the area.  Avoiding straining with bowel movements and keeping the stools nice and soft.  Using a topical prescription steroid cream.  Can also add some over-the-counter topical lidocaine if needed.  If not improving over the next couple of weeks then please return for further work-up.  Sometimes hemorrhoids can become thrombosed and we did discuss that as well.  I think she will do well with conservative therapy.

## 2022-04-25 NOTE — Progress Notes (Signed)
   Acute Office Visit  Subjective:     Patient ID: Tonya Hart, female    DOB: 1991/10/22, 31 y.o.   MRN: [id number]  Chief Complaint  Patient presents with   Hemorrhoids    External hemorrhoid, Denies rectal bleeding, itches 1 day.     HPI Patient is in today for possible hemorrhoid.  She gave birth to her first child about a year ago.  And says about 5 days ago she felt a bump while she was taking a shower in the rectal area and then started to feel little sore and irritated.  No bleeding but has been itchy.  Denies any constipation or straining she feels like she has pretty normal bowel movements.  She has not tried using any over-the-counter medications.  ROS      Objective:    BP 123/80   Pulse 80   Resp 16   Ht 5\' 9"  (1.753 m)   Wt 191 lb (86.6 kg)   SpO2 98%   Breastfeeding Yes   BMI 28.21 kg/m    Physical Exam Vitals reviewed.  Constitutional:      Appearance: She is well-developed.  HENT:     Head: Normocephalic and atraumatic.  Eyes:     Conjunctiva/sclera: Conjunctivae normal.  Cardiovascular:     Rate and Rhythm: Normal rate.  Pulmonary:     Effort: Pulmonary effort is normal.  Genitourinary:      Comments: On rectal exam she has a firm hemorrhoid on the right side.  It is tender to touch.  No active bleeding or surrounding erythema. Skin:    General: Skin is dry.     Coloration: Skin is not pale.  Neurological:     Mental Status: She is alert and oriented to person, place, and time.  Psychiatric:        Behavior: Behavior normal.    No results found for any visits on 04/25/22.      Assessment & Plan:   Problem List Items Addressed This Visit       Cardiovascular and Mediastinum   Hemorrhoids - Primary    Gust diagnosis.  Recommend warm sitz bath.  Gentle cleansing of the area.  Avoiding straining with bowel movements and keeping the stools nice and soft.  Using a topical prescription steroid cream.  Can also add some over-the-counter  topical lidocaine if needed.  If not improving over the next couple of weeks then please return for further work-up.  Sometimes hemorrhoids can become thrombosed and we did discuss that as well.  I think she will do well with conservative therapy.       Relevant Medications   hydrocortisone (PROCTOSOL HC) 2.5 % rectal cream    Meds ordered this encounter  Medications   hydrocortisone (PROCTOSOL HC) 2.5 % rectal cream    Sig: Place 1 application. rectally 2 (two) times daily. PRN    Dispense:  30 g    Refill:  2    Return if symptoms worsen or fail to improve.  06/25/22, MD

## 2022-05-02 ENCOUNTER — Encounter: Payer: Self-pay | Admitting: Physical Medicine & Rehabilitation

## 2022-05-02 ENCOUNTER — Encounter: Payer: Medicaid Other | Attending: Physical Medicine & Rehabilitation | Admitting: Physical Medicine & Rehabilitation

## 2022-05-02 VITALS — BP 116/73 | HR 95 | Ht 69.0 in | Wt 193.0 lb

## 2022-05-02 DIAGNOSIS — M797 Fibromyalgia: Secondary | ICD-10-CM | POA: Insufficient documentation

## 2022-05-02 MED ORDER — PREGABALIN 75 MG PO CAPS: 75.0000 mg | ORAL_CAPSULE | Freq: Two times a day (BID) | ORAL | 1 refills | Status: DC

## 2022-05-02 NOTE — Progress Notes (Signed)
Subjective:    Patient ID: Tonya Hart, female    DOB: [birth year], 31 y.o.   MRN: [id number]  HPI Chief complaint widespread body pain 31 year old female with 67-month history of pain affecting the entire back bilateral arms and to a lesser extent the hips. The patient has no history of trauma.  Her average pain is around 8 out of 10 currently 6 out of 10 alternates between sharp burning dull stabbing tingling interferes with activity and enjoyment of life worse during the daytime mainly with activity.  Walking bending standing seem to aggravate it as well as cleaning.  Improves with rest heat and medication.  She has tried hydrocodone OxyContin as well as gabapentin none of which were very helpful. She has a past psychiatric history for depression and anxiety and has been on Lexapro for couple years.  She has never tried duloxetine. For exercise she either walks or gardens about 45 minutes 5 days/week. The patient has been evaluated by her primary care as well as orthopedic surgeon Dr. August Saucer.  She has had unremarkable cervical thoracic and lumbar MRIs as well as a normal brain MRI Her past surgical history is markable for right shoulder Bankart repair Pain Inventory Average Pain 8 Pain Right Now 6 My pain is constant, sharp, burning, dull, stabbing, and tingling  In the last 24 hours, has pain interfered with the following? General activity 7 Relation with others 8 Enjoyment of life 7 What TIME of day is your pain at its worst? daytime Sleep (in general) Poor  Pain is worse with: walking, bending, standing, and some activites Pain improves with: rest, heat/ice, and medication Relief from Meds: 7  walk without assistance how many minutes can you walk? 30-45 ability to climb steps?  yes do you drive?  yes  not employed: date last employed . I need assistance with the following:  household duties  weakness numbness tremor tingling trouble  walking spasms dizziness depression anxiety  Any changes since last visit?  no  Any changes since last visit?  no    Family History  Problem Relation Age of Onset   Thyroid disease Mother    Hyperlipidemia Mother    Hypertension Mother    Cataracts Mother    Hyperlipidemia Father    COPD Father    Heart attack Father    Hypertension Father    Diabetes Father    Prostate cancer Maternal Grandfather    Social History   Socioeconomic History   Marital status: Single    Spouse name: Not on file   Number of children: Not on file   Years of education: Not on file   Highest education level: Not on file  Occupational History   Occupation: ISS    Employer: [employer]  Tobacco Use   Smoking status: Former    Types: Cigarettes   Smokeless tobacco: Never  Vaping Use   Vaping Use: Every day   Last attempt to quit: 08/04/2020   Substances: Nicotine  Substance and Sexual Activity   Alcohol use: Not Currently    Comment: 3 drinks/week, beer or liquor   Drug use: Yes    Frequency: 7.0 times per week    Types: Marijuana   Sexual activity: Yes    Partners: Male    Birth control/protection: Pill  Other Topics Concern   Not on file  Social History Narrative   Not on file   Social Determinants of Health   Financial Resource Strain: Not on file  Food Insecurity: Not on file  Transportation Needs: Not on file  Physical Activity: Not on file  Stress: Not on file  Social Connections: Not on file   Past Surgical History:  Procedure Laterality Date   SHOULDER ARTHROSCOPY WITH LABRAL REPAIR Left 08/11/2021   Procedure: LEFT SHOULDER ARTHROSCOPY WITH LABRAL REPAIR;  Surgeon: Cammy Copa, MD;  Location: [facility] OR;  Service: Orthopedics;  Laterality: Left;   TONSILLECTOMY     TONSILLECTOMY AND ADENOIDECTOMY     Past Medical History:  Diagnosis Date   Anxiety and depression    no current medications   Asthma    Endometriosis    Environmental and seasonal  allergies    PCOS (polycystic ovarian syndrome)    Seasonal allergies    BP 116/73   Pulse 95   Ht 5\' 9"  (1.753 m)   Wt 193 lb (87.5 kg)   SpO2 98%   BMI 28.50 kg/m   Opioid Risk Score:   Fall Risk Score:  `1  Depression screen [facility] 2/9     05/02/2022   11:25 AM 09/26/2021   11:16 AM 05/18/2020    1:37 PM 04/20/2020    3:48 PM 04/05/2020    2:48 PM 03/16/2020    3:01 PM  Depression screen [facility] 2/9  Decreased Interest 2 2 2 2 2 2   Down, Depressed, Hopeless 1 2 2 2 2 2   PHQ - 2 Score 3 4 4 4 4 4   Altered sleeping 2 2 2 2 2 2   Tired, decreased energy 1 2 2 3 2 1   Change in appetite 2 1 2 2 2 2   Feeling bad or failure about yourself  1 1 1 2 1 1   Trouble concentrating 2 1 2 2 2 1   Moving slowly or fidgety/restless 0 0 1 2 2 2   Suicidal thoughts 0 0 0 1 0 0  PHQ-9 Score 11 11 14 18 15 13   Difficult doing work/chores  Somewhat difficult Somewhat difficult  Somewhat difficult Somewhat difficult     Review of Systems  Constitutional: Negative.   HENT: Negative.    Eyes: Negative.   Respiratory: Negative.    Cardiovascular: Negative.   Gastrointestinal:  Positive for nausea.  Endocrine: Negative.   Genitourinary: Negative.   Musculoskeletal:  Positive for back pain and gait problem.  Skin: Negative.   Allergic/Immunologic: Negative.   Neurological:  Positive for dizziness, tremors, weakness and numbness.  Hematological: Negative.   Psychiatric/Behavioral:  Positive for dysphoric mood and sleep disturbance. The patient is nervous/anxious.       Objective:   Physical Exam Vitals and nursing note reviewed.  Constitutional:      Appearance: She is normal weight.  HENT:     Head: Normocephalic and atraumatic.  Eyes:     Extraocular Movements: Extraocular movements intact.     Conjunctiva/sclera: Conjunctivae normal.     Pupils: Pupils are equal, round, and reactive to light.  Neurological:     Mental Status: She is alert and oriented to person, place, and time.      Cranial Nerves: No dysarthria.     Sensory: No sensory deficit.     Motor: No weakness.     Coordination: Coordination is intact.     Gait: Gait is intact.     Deep Tendon Reflexes:     Reflex Scores:      Tricep reflexes are 2+ on the right side and 2+ on the left side.      Bicep  reflexes are 2+ on the right side and 2+ on the left side.      Brachioradialis reflexes are 2+ on the right side and 2+ on the left side.      Patellar reflexes are 2+ on the right side and 2+ on the left side.      Achilles reflexes are 2+ on the right side and 2+ on the left side.    Comments: Motor strength is 5/5 bilateral deltoid, bicep, tricep, grip, hip flexor, knee extensor, ankle dorsiflexor and plantar flex Negative straight leg raising bilaterally negative foraminal compression test bilaterally  Psychiatric:        Mood and Affect: Mood normal.        Behavior: Behavior normal.    Tenderness to palpation throughout the entire upper mid and lower back area as well as bilateral sternocostal left greater than right lateral epicondyle as well as bilateral greater trochanters.       Assessment & Plan:  1.  Widespread body pain with an extensive work-up essentially negative.  Normal brain cervical thoracic and lumbar MRIs.  She has had no joint swelling or systemic signs to indicate an inflammatory arthritis.  She has had normal TSH a couple years ago The patient has 16 out of 18 fibromyalgia tender points. As discussed with patient, diagnosis is fibromyalgia syndrome. Advised continuing with her activities even if she is tired. Trial of pregabalin 75 mg twice daily patient is calling 2 weeks if not effective and will likely increase to 3 times daily.  We discussed that we will need to slowly increase dose over time and it may be a couple months before we start getting some clinical effect.

## 2022-05-02 NOTE — Patient Instructions (Signed)
Myofascial Pain Syndrome and Fibromyalgia ?Myofascial pain syndrome and fibromyalgia are both pain disorders. You may feel this pain mainly in your muscles. ?Myofascial pain syndrome: ?Always has tender points in the muscles that will cause pain when pressed (trigger points). The pain may come and go. ?Usually affects your neck, upper back, and shoulder areas. The pain often moves into your arms and hands. ?Fibromyalgia: ?Has muscle pains and tenderness that come and go. ?Is often associated with tiredness (fatigue) and sleep problems. ?Has trigger points. ?Tends to be long-lasting (chronic), but is not life-threatening. ?Fibromyalgia and myofascial pain syndrome are not the same. However, they often occur together. If you have both conditions, each can make the other worse. Both are common and can cause enough pain and fatigue to make day-to-day activities difficult. Both can be hard to diagnose because their symptoms are common in many other conditions. ?What are the causes? ?The exact causes of these conditions are not known. ?What increases the risk? ?You are more likely to develop either of these conditions if: ?You have a family history of the condition. ?You are female. ?You have certain triggers, such as: ?Spine disorders. ?An injury (trauma) or other physical stressors. ?Being under a lot of stress. ?Medical conditions such as osteoarthritis, rheumatoid arthritis, or lupus. ?What are the signs or symptoms? ?Fibromyalgia ?The main symptom of fibromyalgia is widespread pain and tenderness in your muscles. Pain is sometimes described as stabbing, shooting, or burning. ?You may also have: ?Tingling or numbness. ?Sleep problems and fatigue. ?Problems with attention and concentration (fibro fog). ?Other symptoms may include: ?Bowel and bladder problems. ?Headaches. ?Vision problems. ?Sensitivity to odors and noises. ?Depression or mood changes. ?Painful menstrual periods (dysmenorrhea). ?Dry skin or eyes. ?These  symptoms can vary over time. ?Myofascial pain syndrome ?Symptoms of myofascial pain syndrome include: ?Tight, ropy bands of muscle. ?Uncomfortable sensations in muscle areas. These may include aching, cramping, burning, numbness, tingling, and weakness. ?Difficulty moving certain parts of the body freely (poor range of motion). ?How is this diagnosed? ?This condition may be diagnosed by your symptoms and medical history. You will also have a physical exam. In general: ?Fibromyalgia is diagnosed if you have pain, fatigue, and other symptoms for more than 3 months, and symptoms cannot be explained by another condition. ?Myofascial pain syndrome is diagnosed if you have trigger points in your muscles, and those trigger points are tender and cause pain elsewhere in your body (referred pain). ?How is this treated? ?Treatment for these conditions depends on the type that you have. ?For fibromyalgia a healthy lifestyle is the most important treatment including aerobic and strength exercises. Different types of medicines are used to help treat pain and include: ?NSAIDs. ?Medicines for treating depression. ?Medicines that help control seizures. ?Medicines that relax the muscles. ?Treatment for myofascial pain syndrome includes: ?Pain medicines, such as NSAIDs. ?Cooling and stretching of muscles. ?Massage therapy with myofascial release technique. ?Trigger point injections. ?Treating these conditions often requires a team of health care providers. These may include: ?Your primary care provider. ?A physical therapist. ?Complementary health care providers, such as massage therapists or acupuncturists. ?A psychiatrist for cognitive behavioral therapy. ?Follow these instructions at home: ?Medicines ?Take over-the-counter and prescription medicines only as told by your health care provider. ?Ask your health care provider if the medicine prescribed to you: ?Requires you to avoid driving or using machinery. ?Can cause constipation.  You may need to take these actions to prevent or treat constipation: ?Drink enough fluid to keep your urine pale   yellow. ?Take over-the-counter or prescription medicines. ?Eat foods that are high in fiber, such as beans, whole grains, and fresh fruits and vegetables. ?Limit foods that are high in fat and processed sugars, such as fried or sweet foods. ?Lifestyle ? ?Do exercises as told by your health care provider or physical therapist. ?Practice relaxation techniques to control your stress. You may want to try: ?Biofeedback. ?Visual imagery. ?Hypnosis. ?Muscle relaxation. ?Yoga. ?Meditation. ?Maintain a healthy lifestyle. This includes eating a healthy diet and getting enough sleep. ?Do not use any products that contain nicotine or tobacco. These products include cigarettes, chewing tobacco, and vaping devices, such as e-cigarettes. If you need help quitting, ask your health care provider. ?General instructions ?Talk to your health care provider about complementary treatments, such as acupuncture or massage. ?Do not do activities that stress or strain your muscles. This includes repetitive motions and heavy lifting. ?Keep all follow-up visits. This is important. ?Where to find support ?Consider joining a support group with others who are diagnosed with this condition. ?National Fibromyalgia Association: www.fmaware.org ?Where to find more information ?American Chronic Pain Association: www.theacpa.org ?Contact a health care provider if: ?You have new symptoms. ?Your symptoms get worse or your pain is severe. ?You have side effects from your medicines. ?You have trouble sleeping. ?Your condition is causing depression or anxiety. ?Get help right away if: ?You have thoughts of hurting yourself or others. ?Get help right awayif you feel like you may hurt yourself or others, or have thoughts about taking your own life. Go to your nearest emergency room or: ?Call 911. ?Call the National Suicide Prevention Lifeline at  1-800-273-8255 or 988. This is open 24 hours a day. ?Text the Crisis Text Line at 741741. ?Summary ?Myofascial pain syndrome and fibromyalgia are pain disorders. ?Myofascial pain syndrome has tender points in the muscles that will cause pain when pressed (trigger points). Fibromyalgia also has muscle pains and tenderness that come and go, but this condition is often associated with fatigue and sleep disturbances. ?Fibromyalgia and myofascial pain syndrome are not the same but often occur together, causing pain and fatigue that make day-to-day activities difficult. ?Follow your health care provider's instructions for taking medicines and maintaining a healthy lifestyle. ?This information is not intended to replace advice given to you by your health care provider. Make sure you discuss any questions you have with your health care provider. ?Document Revised: 10/07/2021 Document Reviewed: 10/07/2021 ?Elsevier Patient Education ? 2023 Elsevier Inc. ? ?

## 2022-05-05 ENCOUNTER — Encounter: Payer: Self-pay | Admitting: Medical-Surgical

## 2022-05-13 ENCOUNTER — Encounter: Payer: Self-pay | Admitting: Physical Medicine & Rehabilitation

## 2022-05-16 ENCOUNTER — Ambulatory Visit: Payer: Medicaid Other | Admitting: Medical-Surgical

## 2022-05-16 ENCOUNTER — Encounter: Payer: Self-pay | Admitting: Medical-Surgical

## 2022-05-16 VITALS — BP 118/76 | HR 86 | Resp 20 | Ht 69.0 in | Wt 196.5 lb

## 2022-05-16 DIAGNOSIS — F418 Other specified anxiety disorders: Secondary | ICD-10-CM | POA: Diagnosis not present

## 2022-05-16 DIAGNOSIS — M797 Fibromyalgia: Secondary | ICD-10-CM | POA: Diagnosis not present

## 2022-05-24 ENCOUNTER — Encounter: Payer: Self-pay | Admitting: Medical-Surgical

## 2022-05-25 NOTE — Telephone Encounter (Signed)
Patient scheduled.

## 2022-05-26 ENCOUNTER — Encounter: Payer: Self-pay | Admitting: Family Medicine

## 2022-05-26 ENCOUNTER — Ambulatory Visit: Payer: Medicaid Other | Admitting: Family Medicine

## 2022-05-26 VITALS — BP 119/74 | HR 80 | Resp 16 | Ht 69.0 in | Wt 194.0 lb

## 2022-05-26 DIAGNOSIS — L0291 Cutaneous abscess, unspecified: Secondary | ICD-10-CM | POA: Diagnosis not present

## 2022-05-26 MED ORDER — DOXYCYCLINE HYCLATE 100 MG PO TABS: 100.0000 mg | ORAL_TABLET | Freq: Two times a day (BID) | ORAL | 0 refills | Status: DC

## 2022-05-26 NOTE — Progress Notes (Signed)
   Acute Office Visit  Subjective:     Patient ID: Tonya Hart, female    DOB: 1991-01-03, 31 y.o.   MRN: [id number]  Chief Complaint  Patient presents with   Bump    Patient complains of painful/red bump on right side of back for 3 days.     HPI Patient is in today for red tender, painful, swollen area on her right lower back x 3 days.  She does not have she is experienced any fevers or chills she has not had any drainage from the lesion and she has not experienced anything like this before.   ROS      Objective:    BP 119/74   Pulse 80   Resp 16   Ht 5\' 9"  (1.753 m)   Wt 194 lb (88 kg)   SpO2 96%   BMI 28.65 kg/m    Physical Exam Vitals reviewed.  Constitutional:      Appearance: She is well-developed.  HENT:     Head: Normocephalic and atraumatic.  Eyes:     Conjunctiva/sclera: Conjunctivae normal.  Cardiovascular:     Rate and Rhythm: Normal rate.  Pulmonary:     Effort: Pulmonary effort is normal.  Skin:    General: Skin is dry.     Coloration: Skin is not pale.     Comments: Erythematous, tender area measuring approximately 2 cm in size with some induration.  No drainage or pustule.  Neurological:     Mental Status: She is alert and oriented to person, place, and time.  Psychiatric:        Behavior: Behavior normal.     No results found for any visits on 05/26/22.      Assessment & Plan:   Problem List Items Addressed This Visit   None Visit Diagnoses     Abscess    -  Primary   Relevant Medications   doxycycline (VIBRA-TABS) 100 MG tablet      Area is most consistent with an abscess.  No open drainage or pustule on the surface.  We discussed doing warm hot moist compresses and will start doxycycline.  If not improving over the weekend then please let 07/27/22 know.  Meds ordered this encounter  Medications   doxycycline (VIBRA-TABS) 100 MG tablet    Sig: Take 1 tablet (100 mg total) by mouth 2 (two) times daily.    Dispense:  14 tablet     Refill:  0    No follow-ups on file.  [place], MD

## 2022-05-29 ENCOUNTER — Encounter: Payer: Self-pay | Admitting: Family Medicine

## 2022-05-29 NOTE — Telephone Encounter (Signed)
Okay to put in acute slot on either mine  on Joy schedule or mine

## 2022-05-30 ENCOUNTER — Encounter: Payer: Self-pay | Admitting: Family Medicine

## 2022-05-30 ENCOUNTER — Ambulatory Visit: Payer: Medicaid Other | Admitting: Family Medicine

## 2022-05-30 VITALS — BP 111/61 | HR 86 | Wt 193.0 lb

## 2022-05-30 DIAGNOSIS — L0291 Cutaneous abscess, unspecified: Secondary | ICD-10-CM | POA: Diagnosis not present

## 2022-05-30 NOTE — Progress Notes (Signed)
   Acute Office Visit  Subjective:     Patient ID: Tonya Hart, female    DOB: 31-May-1991, 31 y.o.   MRN: [id number]  No chief complaint on file.   HPI Patient is in today for abscess on her right low back.  She has been taking the doxycycline through the weekend and says it is really just not getting any better it is about the same size and it still really tender and painful.  Encouraged her to return in case that needed I&D.  No fever or chills.  She still has about 2 more days of doxycycline.                  ROS      Objective:    BP 111/61   Pulse 86   Wt 193 lb (87.5 kg)   SpO2 98%   BMI 28.50 kg/m     Physical Exam  No results found for any visits on 05/30/22.      Assessment & Plan:   Problem List Items Addressed This Visit   None Visit Diagnoses     Abscess    -  Primary      Abscess-discussed treatment with incision and drainage.  She is okay with proceeding forward.  Incision and Drainage Procedure Note  Pre-operative Diagnosis: Abscess, right lower back  Post-operative Diagnosis: same  Indications: pain and not improving   Anesthesia: 1% lidocaine with epinephrine  Procedure Details  The procedure, risks and complications have been discussed in detail (including, but not limited to airway compromise, infection, bleeding) with the patient, and the patient has signed consent to the procedure.  The skin was sterilely prepped and draped over the affected area in the usual fashion. After adequate local anesthesia, I&D with a #11 blade was performed on the right low back. Purulent drainage: absent. Area probbed with sterile applicator.  Lesion approximately 2 cm deep.  Only bloody drainage noted. The patient was observed until stable.  Findings: Abscess.  Only bloody drainage.    EBL: some bloody drainage  Drains: iodoform gauze placed.    Condition: Tolerated procedure well   Complications: none.    Wound care discussed.  See AVS.   Make sure to complete doxycycline course.  No orders of the defined types were placed in this encounter.   No follow-ups on file.   Nani Gasser, MD

## 2022-05-30 NOTE — Patient Instructions (Signed)
Check the bandage a couple of times this afternoon if you start to see any blood oozing through then please put some fresh gauze on top and ice the area for about 5 to 10 minutes when you get home, around dinnertime, and then again before bedtime. Tomorrow evening you can remove the gauze that is inserted into the skin area. Keep covered with gauze for couple days as it will still continue to drain some. After that okay to apply Vaseline twice a day to keep it well moisturized and improve healing.  Okay to use Aquaphor.

## 2022-05-30 NOTE — Telephone Encounter (Signed)
Patient has been scheduled for 11:30am for this morning with Dr Linford Arnold.A MUCK

## 2022-06-22 ENCOUNTER — Encounter: Payer: Self-pay | Admitting: Physical Medicine & Rehabilitation

## 2022-06-22 ENCOUNTER — Encounter: Payer: Medicaid Other | Attending: Physical Medicine & Rehabilitation | Admitting: Physical Medicine & Rehabilitation

## 2022-06-22 VITALS — BP 101/70 | HR 83 | Ht 69.0 in | Wt 194.0 lb

## 2022-06-22 DIAGNOSIS — M797 Fibromyalgia: Secondary | ICD-10-CM | POA: Insufficient documentation

## 2022-06-22 MED ORDER — PREGABALIN 150 MG PO CAPS: 150.0000 mg | ORAL_CAPSULE | Freq: Two times a day (BID) | ORAL | 1 refills | Status: DC

## 2022-06-22 NOTE — Progress Notes (Signed)
Subjective:    Patient ID: Tonya Hart, female    DOB: May 31, 1991, 31 y.o.   MRN: [id number]  HPI 31 year old female with widespread body pain diagnosed with fibromyalgia syndrome.  The patient was started on Lyrica 75 mg twice daily and increase to 3 times daily.  She has quit breast-feeding her 58-month infant.  She feels like the current dose is not causing any type of side effects.  She does not feel like she is getting much relief at this dose.  Fortunately she has started doing some exercising such as walking in combination with some beginning yoga. Functionally she remains at a modified independent level.  Pain Inventory Average Pain 9 Pain Right Now 9 My pain is sharp, burning, dull, stabbing, tingling, and aching  In the last 24 hours, has pain interfered with the following? General activity 8 Relation with others 8 Enjoyment of life 9 What TIME of day is your pain at its worst? daytime, evening, and night Sleep (in general) Poor  Pain is worse with: walking, bending, sitting, inactivity, standing, and some activites Pain improves with: rest, heat/ice, therapy/exercise, and pacing activities Relief from Meds: 0  Family History  Problem Relation Age of Onset   Thyroid disease Mother    Hyperlipidemia Mother    Hypertension Mother    Cataracts Mother    Hyperlipidemia Father    COPD Father    Heart attack Father    Hypertension Father    Diabetes Father    Prostate cancer Maternal Grandfather    Social History   Socioeconomic History   Marital status: Single    Spouse name: Not on file   Number of children: Not on file   Years of education: Not on file   Highest education level: Not on file  Occupational History   Occupation: ISS    Employer: [employer]  Tobacco Use   Smoking status: Former    Types: Cigarettes   Smokeless tobacco: Never  Vaping Use   Vaping Use: Every day   Last attempt to quit: 08/04/2020   Substances: Nicotine  Substance  and Sexual Activity   Alcohol use: Not Currently    Comment: 3 drinks/week, beer or liquor   Drug use: Yes    Frequency: 7.0 times per week    Types: Marijuana   Sexual activity: Yes    Partners: Male    Birth control/protection: Pill  Other Topics Concern   Not on file  Social History Narrative   Not on file   Social Determinants of Health   Financial Resource Strain: Not on file  Food Insecurity: Not on file  Transportation Needs: Not on file  Physical Activity: Not on file  Stress: Not on file  Social Connections: Not on file   Past Surgical History:  Procedure Laterality Date   SHOULDER ARTHROSCOPY WITH LABRAL REPAIR Left 08/11/2021   Procedure: LEFT SHOULDER ARTHROSCOPY WITH LABRAL REPAIR;  Surgeon: Cammy Copa, MD;  Location: [facility] OR;  Service: Orthopedics;  Laterality: Left;   TONSILLECTOMY     TONSILLECTOMY AND ADENOIDECTOMY     Past Surgical History:  Procedure Laterality Date   SHOULDER ARTHROSCOPY WITH LABRAL REPAIR Left 08/11/2021   Procedure: LEFT SHOULDER ARTHROSCOPY WITH LABRAL REPAIR;  Surgeon: Cammy Copa, MD;  Location: [facility] OR;  Service: Orthopedics;  Laterality: Left;   TONSILLECTOMY     TONSILLECTOMY AND ADENOIDECTOMY     Past Medical History:  Diagnosis Date   Anxiety and depression  no current medications   Asthma    Endometriosis    Environmental and seasonal allergies    PCOS (polycystic ovarian syndrome)    Seasonal allergies    Ht 5\' 9"  (1.753 m)   Wt 194 lb (88 kg)   BMI 28.65 kg/m   Opioid Risk Score:   Fall Risk Score:  `1  Depression screen [facility] 2/9     06/22/2022   11:26 AM 05/16/2022    2:44 PM 05/02/2022   11:25 AM 09/26/2021   11:16 AM 05/18/2020    1:37 PM 04/20/2020    3:48 PM 04/05/2020    2:48 PM  Depression screen PHQ 2/9  Decreased Interest 1 2 2 2 2 2 2   Down, Depressed, Hopeless 1 1 1 2 2 2 2   PHQ - 2 Score 2 3 3 4 4 4 4   Altered sleeping 3 3 2 2 2 2 2   Tired, decreased energy 3 2 1 2 2 3 2   Change in  appetite 0 1 2 1 2 2 2   Feeling bad or failure about yourself  1 1 1 1 1 2 1   Trouble concentrating 1 1 2 1 2 2 2   Moving slowly or fidgety/restless 0 1 0 0 1 2 2   Suicidal thoughts 0 0 0 0 0 1 0  PHQ-9 Score 10 12 11 11 14 18 15   Difficult doing work/chores Somewhat difficult Very difficult  Somewhat difficult Somewhat difficult  Somewhat difficult      Review of Systems  Musculoskeletal:  Positive for back pain.       Bilateral Arm Pain  Bilateral Leg Pain   Bilateral Hand Pain  Bilateral Foot Pain        Objective:   Physical Exam Vitals reviewed.  Constitutional:      Appearance: She is obese.  HENT:     Head: Normocephalic and atraumatic.  Eyes:     Extraocular Movements: Extraocular movements intact.     Pupils: Pupils are equal, round, and reactive to light.  Musculoskeletal:     Comments: There is tenderness palpation bilateral upper traps along the thoracic and lumbar paraspinal area no tenderness over the sacral or gluteal region.  There is tenderness in the periscapular area bilaterally Lumbar range of motion is normal but is accompanied by pain with flexion greater than with extension Ambulates without assistive device no evidence of toe drag or knee instability  Skin:    General: Skin is warm and dry.  Neurological:     Mental Status: She is alert and oriented to person, place, and time.  Psychiatric:        Mood and Affect: Mood normal.        Behavior: Behavior normal.           Assessment & Plan:  1.  Fibromyalgia syndrome has started exercising and we discussed recommending continuing both her yoga and walking. In terms of medication management would recommend increasing Lyrica to 150 mg twice daily.  She is to call in 1 to 2 weeks to let 4/9 know whether or not this dosing change has helped and if not may go up to 3 times daily

## 2022-06-27 NOTE — Progress Notes (Unsigned)
Virtual Visit via Video Note  I connected with Tonya Hart on 06/27/22 at 10:30 AM EDT by a video enabled telemedicine application and verified that I am speaking with the correct person using two identifiers.   I discussed the limitations of evaluation and management by telemedicine and the availability of in person appointments. The patient expressed understanding and agreed to proceed.  Patient location: home Provider locations: office  Subjective:    CC: Anxiety  HPI: Pleasant 31 year old female presenting via MyChart video visit to discuss anxiety and request a refill on BuSpar.   Past medical history, Surgical history, Family history not pertinant except as noted below, Social history, Allergies, and medications have been entered into the medical record, reviewed, and corrections made.   Review of Systems: See HPI for pertinent positives and negatives.   Objective:    General: Speaking clearly in complete sentences without any shortness of breath.  Alert and oriented x3.  Normal judgment. No apparent acute distress.  Impression and Recommendations:    No problem-specific Assessment & Plan notes found for this encounter.   I discussed the assessment and treatment plan with the patient. The patient was provided an opportunity to ask questions and all were answered. The patient agreed with the plan and demonstrated an understanding of the instructions.   The patient was advised to call back or seek an in-person evaluation if the symptoms worsen or if the condition fails to improve as anticipated.  *** minutes of non-face-to-face time was provided during this encounter.  No follow-ups on file.  Thayer Ohm, DNP, APRN, FNP-BC [facility] and [facility]

## 2022-06-28 ENCOUNTER — Encounter: Payer: Self-pay | Admitting: Medical-Surgical

## 2022-06-28 ENCOUNTER — Telehealth: Payer: Self-pay | Admitting: Medical-Surgical

## 2022-06-28 DIAGNOSIS — Z91199 Patient's noncompliance with other medical treatment and regimen due to unspecified reason: Secondary | ICD-10-CM

## 2022-06-28 DIAGNOSIS — F411 Generalized anxiety disorder: Secondary | ICD-10-CM

## 2022-07-03 NOTE — Progress Notes (Unsigned)
Virtual Visit via Video Note  I connected with Tonya Hart on 07/03/22 at  1:00 PM EDT by a video enabled telemedicine application and verified that I am speaking with the correct person using two identifiers.   I discussed the limitations of evaluation and management by telemedicine and the availability of in person appointments. The patient expressed understanding and agreed to proceed.  Patient location: home Provider locations: office  Subjective:    CC: Anxiety follow-up  HPI: Pleasant 31 year old female presenting today for follow-up on anxiety.  She reports that her anxiety levels are extremely high and she is requesting a refill on BuSpar.   Past medical history, Surgical history, Family history not pertinant except as noted below, Social history, Allergies, and medications have been entered into the medical record, reviewed, and corrections made.   Review of Systems: See HPI for pertinent positives and negatives.   Objective:    General: Speaking clearly in complete sentences without any shortness of breath.  Alert and oriented x3.  Normal judgment. No apparent acute distress.  Impression and Recommendations:    No problem-specific Assessment & Plan notes found for this encounter.   I discussed the assessment and treatment plan with the patient. The patient was provided an opportunity to ask questions and all were answered. The patient agreed with the plan and demonstrated an understanding of the instructions.   The patient was advised to call back or seek an in-person evaluation if the symptoms worsen or if the condition fails to improve as anticipated.  *** minutes of non-face-to-face time was provided during this encounter.  No follow-ups on file.  Thayer Ohm, DNP, APRN, FNP-BC [facility]

## 2022-07-04 ENCOUNTER — Telehealth (INDEPENDENT_AMBULATORY_CARE_PROVIDER_SITE_OTHER): Payer: Medicaid Other | Admitting: Medical-Surgical

## 2022-07-04 ENCOUNTER — Encounter: Payer: Self-pay | Admitting: Medical-Surgical

## 2022-07-04 DIAGNOSIS — F418 Other specified anxiety disorders: Secondary | ICD-10-CM

## 2022-07-04 MED ORDER — BUSPIRONE HCL 15 MG PO TABS: 15.0000 mg | ORAL_TABLET | Freq: Three times a day (TID) | ORAL | 0 refills | Status: DC

## 2022-07-28 ENCOUNTER — Ambulatory Visit: Payer: Medicaid Other | Admitting: Neurology

## 2022-07-28 ENCOUNTER — Encounter: Payer: Self-pay | Admitting: Neurology

## 2022-07-28 VITALS — BP 121/81 | HR 95 | Ht 70.0 in | Wt 196.0 lb

## 2022-07-28 DIAGNOSIS — R200 Anesthesia of skin: Secondary | ICD-10-CM | POA: Diagnosis not present

## 2022-07-28 DIAGNOSIS — R202 Paresthesia of skin: Secondary | ICD-10-CM | POA: Diagnosis not present

## 2022-07-28 DIAGNOSIS — R52 Pain, unspecified: Secondary | ICD-10-CM

## 2022-07-28 NOTE — Progress Notes (Signed)
[facility] Clinic Note - Initial Visit   Date: 07/28/2022   Tonya Hart MRN: [id number] DOB: 1991/08/07   Dear Gloriann Loan, PA-C:  Thank you for your kind referral of Tonya Hart for consultation of tingling. Although her history is well known to you, please allow [place] to reiterate it for the purpose of our medical record. The patient was accompanied to the clinic by self.  Tonya Hart is a 31 y.o. right-handed female with anxiety/depression and fibromyalgia presenting for evaluation of muscle spasms and tingling.   IMPRESSION/PLAN: Myriad of neurological symptoms including muscle spasms, shakiness, tingling, numbness, and forgetfulness. Imaging of the neuroaxis does not show any primary neurological etiology for her symptoms.  She has been diagnosed with fibromyalgia and also being treated for anxiety/depression.  Her neurological exam shows subjective sensory loss on the left and otherwise is normal.  To be complete, I will bring her back for NCS/EMG of the left arm and leg; however, my overall suspicion for primary nerve condition is very low.  Most likely, her symptoms are manifestation of stress/anxiety and fibromylagia.    ------------------------------------------------------------- History of present illness: Around late 2021, she began having shakiness of the hands/feet, spasms of the hand and leg, numbness/tingling of the left > right leg.  Symptoms are constant and improved with rest.   She also has heat stabbing sensation over the back and hips.  She has diffuse whole body pain and diagnosed with fibromylagia which is followed by pain management.  She takes Lyrica 114m BID.  She has seen orthopeadics for these symptoms and had MRI of the entire neuroaxis which did not show any structural pathology to explain her symptoms.  Specifically, no evidence of multiple sclerosis. She complains of memory loss and increased forgetfulness.  She is here for  further evaluation.    Out-side paper records, electronic medical record, and images have been reviewed where available and summarized as:  MRI brain wo contrast 02/22/2022:  Normal MRI of the brain.  MRI cervical, thoracic, and lumbar spine 02/27/2022: 1. Small right paracentral disc protrusions at C7-T1 and T2-3 without significant stenosis or overt neural impingement. 2. Otherwise unremarkable MRI of the thoracic spine. No other significant disc pathology, stenosis or neural impingement.   MR LUMBAR SPINE IMPRESSION:   1. Shallow broad-based central disc protrusion at L5-S1 without stenosis or neural impingement. 2. Mild bilateral facet hypertrophy throughout the lumbar spine, which could contribute to underlying back pain.   No results found for: "HGBA1C" Lab Results  Component Value Date   VITAMINB12 329 02/16/2022   Lab Results  Component Value Date   TSH 0.73 05/12/2020   No results found for: "ESRSEDRATE", "POCTSEDRATE"  Past Medical History:  Diagnosis Date   Anxiety and depression    no current medications   Asthma    Endometriosis    Environmental and seasonal allergies    PCOS (polycystic ovarian syndrome)    Seasonal allergies     Past Surgical History:  Procedure Laterality Date   SHOULDER ARTHROSCOPY WITH LABRAL REPAIR Left 08/11/2021   Procedure: LEFT SHOULDER ARTHROSCOPY WITH LABRAL REPAIR;  Surgeon: DMeredith Pel MD;  [place] [facility]  Service: Orthopedics;  Laterality: Left;   TONSILLECTOMY     TONSILLECTOMY AND ADENOIDECTOMY       Medications:  Outpatient Encounter Medications as of 07/28/2022  Medication Sig   albuterol (VENTOLIN HFA) 108 (90 Base) MCG/ACT inhaler Inhale 2 puffs into the lungs every 6 (six) hours as needed for  wheezing.   busPIRone (BUSPAR) 15 MG tablet Take 1 tablet (15 mg total) by mouth 3 (three) times daily.   EPINEPHrine 0.3 mg/0.3 mL IJ SOAJ injection EpiPen for hypersensitivity reaction. For pediatric patients 15-29  kg, use 0.15 mg dose. For pediatric patients =/> 30 kg and adult patients, use 0.3 mg dose.   escitalopram (LEXAPRO) 20 MG tablet Take 1 tablet (20 mg total) by mouth daily.   pregabalin (LYRICA) 150 MG capsule Take 1 capsule (150 mg total) by mouth 2 (two) times daily.   Prenatal Vit-Fe Fumarate-FA (MULTIVITAMIN-PRENATAL) 27-0.8 MG TABS tablet Take 1 tablet by mouth daily at 12 noon.   No facility-administered encounter medications on file as of 07/28/2022.    Allergies:  Allergies  Allergen Reactions   Chicken Meat (Diagnostic) Anaphylaxis   Bee Venom Hives    swelling   Latex Hives   Soap Hives    Dove soap    Family History: Family History  Problem Relation Age of Onset   Thyroid disease Mother    Hyperlipidemia Mother    Hypertension Mother    Cataracts Mother    Hyperlipidemia Father    COPD Father    Heart attack Father    Hypertension Father    Diabetes Father    Prostate cancer Maternal Grandfather     Social History: Social History   Tobacco Use   Smoking status: Former    Types: Cigarettes    Quit date: 2019    Years since quitting: 4.6   Smokeless tobacco: Never  Vaping Use   Vaping Use: Every day   Last attempt to quit: 08/04/2020   Substances: Nicotine  Substance Use Topics   Alcohol use: Not Currently    Comment: 3 drinks/week, beer or liquor   Drug use: Yes    Frequency: 7.0 times per week    Types: Marijuana   Social History   Social History Narrative   Right Handed    Lives in a one story home     Vital Signs:  BP 121/81   Pulse 95   Ht 5' 10"  (1.778 m)   Wt 196 lb (88.9 kg)   SpO2 99%   BMI 28.12 kg/m    Neurological Exam: MENTAL STATUS including orientation to time, place, person, recent and remote memory, attention span and concentration, language, and fund of knowledge is normal.  Speech is not dysarthric.  CRANIAL NERVES: II:  No visual field defects.    III-IV-VI: Pupils equal round and reactive to light.  Normal  conjugate, extra-ocular eye movements in all directions of gaze.  No nystagmus.  No ptosis.   V:  Normal facial sensation.    VII:  Normal facial symmetry and movements.   VIII:  Normal hearing and vestibular function.   IX-X:  Normal palatal movement.   XI:  Normal shoulder shrug and head rotation.   XII:  Normal tongue strength and range of motion, no deviation or fasciculation.  MOTOR:  Irregular and distractible mild shakiness of the hands. No atrophy, fasciculations or abnormal movements.  No pronator drift.   Upper Extremity:  Right  Left  Deltoid  5/5   5/5   Biceps  5/5   5/5   Triceps  5/5   5/5   Wrist extensors  5/5   5/5   Wrist flexors  5/5   5/5   Finger extensors  5/5   5/5   Finger flexors  5/5   5/5   Dorsal interossei  5/5   5/5   Abductor pollicis  5/5   5/5   Tone (Ashworth scale)  0  0   Lower Extremity:  Right  Left  Hip flexors  5/5   5/5   Knee flexors  5/5   5/5   Knee extensors  5/5   5/5   Dorsiflexors  5/5   5/5   Plantarflexors  5/5   5/5   Toe extensors  5/5   5/5   Toe flexors  5/5   5/5   Tone (Ashworth scale)  0  0   MSRs:  Right        Left                  brachioradialis 2+  2+  biceps 2+  2+  triceps 2+  2+  patellar 2+  2+  ankle jerk 2+  2+  Hoffman no  no  plantar response down  down   SENSORY:  Pin prick and temperature reduced on the left hand, forearm, and leg in a diffuse pattern.  Sensation intact on the right side.  Romberg's sign absent.   COORDINATION/GAIT: Normal finger-to- nose-finger.  Intact rapid alternating movements bilaterally.  Gait appears slow, stable, unassisted. Tandem and stressed gait intact.    Thank you for allowing me to participate in patient's care.  If I can answer any additional questions, I would be pleased to do so.    Sincerely,    Evangelos Paulino K. Posey Pronto, DO

## 2022-07-28 NOTE — Patient Instructions (Signed)
Nerve testing of the left arm and leg ° °ELECTROMYOGRAM AND NERVE CONDUCTION STUDIES (EMG/NCS) INSTRUCTIONS ° °How to Prepare °The neurologist conducting the EMG will need to know if you have certain medical conditions. Tell the neurologist and other EMG lab personnel if you: °Have a pacemaker or any other electrical medical device °Take blood-thinning medications °Have hemophilia, a blood-clotting disorder that causes prolonged bleeding °Bathing °Take a shower or bath shortly before your exam in order to remove oils from your skin. Don’t apply lotions or creams before the exam.  °What to Expect °You’ll likely be asked to change into a hospital gown for the procedure and lie down on an examination table. The following explanations can help you understand what will happen during the exam.  °Electrodes. The neurologist or a technician places surface electrodes at various locations on your skin depending on where you’re experiencing symptoms. Or the neurologist may insert needle electrodes at different sites depending on your symptoms.  °Sensations. The electrodes will at times transmit a tiny electrical current that you may feel as a twinge or spasm. The needle electrode may cause discomfort or pain that usually ends shortly after the needle is removed. °If you are concerned about discomfort or pain, you may want to talk to the neurologist about taking a short break during the exam.  °Instructions. During the needle EMG, the neurologist will assess whether there is any spontaneous electrical activity when the muscle is at rest - activity that isn’t present in healthy muscle tissue - and the degree of activity when you slightly contract the muscle.  °He or she will give you instructions on resting and contracting a muscle at appropriate times. Depending on what muscles and nerves the neurologist is examining, he or she may ask you to change positions during the exam.  °After your EMG °You may experience some temporary,  minor bruising where the needle electrode was inserted into your muscle. This bruising should fade within several days. If it persists, contact your primary care doctor.  ° °

## 2022-08-01 ENCOUNTER — Ambulatory Visit: Payer: Medicaid Other | Admitting: Neurology

## 2022-08-01 DIAGNOSIS — R202 Paresthesia of skin: Secondary | ICD-10-CM

## 2022-08-01 DIAGNOSIS — R200 Anesthesia of skin: Secondary | ICD-10-CM

## 2022-08-01 NOTE — Procedures (Signed)
[facility]  [address] [place], [address]  [place], [place] [address] Tel: [phone] Fax:  [phone] Test Date:  08/01/2022  Patient: Tonya Hart DOB: [birth year] Physician: Nita Sickle, DO  Sex: Female Height: 5\' 10"  Ref Phys: , DO  ID#: Nita Sickle   Technician:    Patient Complaints: This is a 31 year old female referred for evaluation of left arm and leg paresthesias.  NCV & EMG Findings: Extensive electrodiagnostic testing of the left upper and lower extremity shows:  Left median, ulnar, mixed palmar, sural, and superficial peroneal nerves are within normal limits. Left median, ulnar, tibial motor responses are within normal limits.  Left peroneal motor response at the extensor digitorum brevis is mildly reduced, and normal at the tibialis anterior. Left tibial H reflex study is within normal limits. There is no evidence of active or chronic motor axonal loss changes affecting any of the tested muscles.  Motor unit configuration and recruitment pattern is within normal limits.  Impression: This is a normal study of the left upper and lower extremities.  In particular, there is no evidence of a large fiber sensorimotor polyneuropathy, or cervical/lumbosacral radiculopathy.   ___________________________ 37, DO    Nerve Conduction Studies Anti Sensory Summary Table   Stim Site NR Peak (ms) Norm Peak (ms) O-P Amp (V) Norm O-P Amp  Left Median Anti Sensory (2nd Digit)  Wrist    2.8 <3.3 41.6 >20  Left Sup Peroneal Anti Sensory (Ant Lat Mall)  34C  12 cm    2.9 <4.4 9.0 >6  Left Sural Anti Sensory (Lat Mall)  34C  Calf    3.4 <4.4 13.9 >6  Left Ulnar Anti Sensory (5th Digit)  34C  Wrist    2.8 <3.0 36.8 >18   Motor Summary Table   Stim Site NR Onset (ms) Norm Onset (ms) O-P Amp (mV) Norm O-P Amp Site1 Site2 Delta-0 (ms) Dist (cm) Vel (m/s) Norm Vel (m/s)  Left Median Motor (Abd Poll Brev)  34C  Wrist    3.0 <3.9 11.6 >6 Elbow  Wrist 4.9 30.0 61 >51  Elbow    7.9  10.5         Left Peroneal Motor (Ext Dig Brev)  34C  Ankle    5.2 <5.5 2.5 >3 B Fib Ankle 8.9 40.0 45 >41  B Fib    14.1  2.0  Poplt B Fib 2.0 10.0 50 >41  Poplt    16.1  1.8         Left Peroneal TA Motor (Tib Ant)  34C  Fib Head    2.6 <4.0 4.2 >4 Poplit Fib Head 1.4 10.0 71 >41  Poplit    4.0  3.9         Left Tibial Motor (Abd Hall Brev)  34C  Ankle    5.6 <5.8 10.2 >8 Knee Ankle 10.0 42.0 42 >41  Knee    15.6  5.1         Left Ulnar Motor (Abd Dig Minimi)  34C  Wrist    2.6 <3.0 10.6 >8 B Elbow Wrist 3.5 22.0 63 >51  B Elbow    6.1  9.9  A Elbow B Elbow 1.6 10.0 62 >51  A Elbow    7.7  9.9          Comparison Summary Table   Stim Site NR Peak (ms) Norm Peak (ms) P-T Amp (V) Site1 Site2 Delta-P (ms) Norm Delta (ms)  Left Median/Ulnar Palm  Comparison (Wrist - 8cm)  34C  Median Palm    1.5 <2.2 128.2 Median Palm Ulnar Palm 0.0   Ulnar Palm    1.5 <2.2 32.4       H Reflex Studies   NR H-Lat (ms) Lat Norm (ms) L-R H-Lat (ms)  Left Tibial (Gastroc)  34C     34.29 <35    EMG   Side Muscle Ins Act Fibs Fasc Recrt Dur. Amp. Poly. Activation Comment  Left AntTibialis Nml Nml Nml Nml Nml Nml Nml Nml N/A  Left Gastroc Nml Nml Nml Nml Nml Nml Nml Nml N/A  Left Flex Dig Long Nml Nml Nml Nml Nml Nml Nml Nml N/A  Left RectFemoris Nml Nml Nml Nml Nml Nml Nml Nml N/A  Left GluteusMed Nml Nml Nml Nml Nml Nml Nml Nml N/A  Left 1stDorInt Nml Nml Nml Nml Nml Nml Nml Nml N/A  Left PronatorTeres Nml Nml Nml Nml Nml Nml Nml Nml N/A  Left Biceps Nml Nml Nml Nml Nml Nml Nml Nml N/A  Left Triceps Nml Nml Nml Nml Nml Nml Nml Nml N/A  Left Deltoid Nml Nml Nml Nml Nml Nml Nml Nml N/A      Waveforms:

## 2022-08-03 ENCOUNTER — Encounter: Payer: Medicaid Other | Attending: Physical Medicine & Rehabilitation | Admitting: Physical Medicine & Rehabilitation

## 2022-08-03 ENCOUNTER — Encounter: Payer: Self-pay | Admitting: Physical Medicine & Rehabilitation

## 2022-08-03 VITALS — BP 117/76 | HR 94 | Ht 70.0 in | Wt 196.8 lb

## 2022-08-03 DIAGNOSIS — M797 Fibromyalgia: Secondary | ICD-10-CM

## 2022-08-03 MED ORDER — AMITRIPTYLINE HCL 10 MG PO TABS: 10.0000 mg | ORAL_TABLET | Freq: Every day | ORAL | 2 refills | Status: DC

## 2022-08-03 MED ORDER — PREGABALIN 225 MG PO CAPS: 225.0000 mg | ORAL_CAPSULE | Freq: Two times a day (BID) | ORAL | 1 refills | Status: DC

## 2022-08-03 NOTE — Progress Notes (Signed)
Subjective:    Patient ID: Tonya Hart, female    DOB: November 29, 1990, 29 y.o.   MRN: [id number]  HPI  31 year old female with history of fibromyalgia syndrome.  She has had rheumatologic as well as neurologic work-up.  She recently completed EMG/NCV performed by Dr. Allena Katz.  This was normal. The patient has been titrated up on her Lyrica over the last several months.  She is up to 150 mg 3 times daily.  She was initially doing quite well.  She has been increasing her yoga sessions in the morning as well as her walking.  She increased her walking distance quite a bit 1 day and has had some problems with increased pain all over her body since that time.  She states her family told her that she is trying to push things a little bit too much. She has had no new injuries. Pain Inventory Average Pain 9 Pain Right Now 8 My pain is sharp, burning, dull, stabbing, tingling, and aching  In the last 24 hours, has pain interfered with the following? General activity 8 Relation with others 8 Enjoyment of life 8 What TIME of day is your pain at its worst? morning  and evening Sleep (in general) Poor  Pain is worse with: walking, bending, sitting, inactivity, standing, and some activites Pain improves with: rest and heat/ice Relief from Meds: 0  Family History  Problem Relation Age of Onset   Thyroid disease Mother    Hyperlipidemia Mother    Hypertension Mother    Cataracts Mother    Hyperlipidemia Father    COPD Father    Heart attack Father    Hypertension Father    Diabetes Father    Prostate cancer Maternal Grandfather    Social History   Socioeconomic History   Marital status: Single    Spouse name: Not on file   Number of children: 1   Years of education: Not on file   Highest education level: Not on file  Occupational History   Occupation: ISS    Employer: [employer]  Tobacco Use   Smoking status: Former    Types: Cigarettes    Quit date: 2019    Years since  quitting: 4.7   Smokeless tobacco: Never  Vaping Use   Vaping Use: Every day   Last attempt to quit: 08/04/2020   Substances: Nicotine  Substance and Sexual Activity   Alcohol use: Not Currently    Comment: 3 drinks/week, beer or liquor   Drug use: Yes    Frequency: 7.0 times per week    Types: Marijuana   Sexual activity: Yes    Partners: Male    Birth control/protection: Pill  Other Topics Concern   Not on file  Social History Narrative   Right Handed    Lives in a one story home    Social Determinants of Health   Financial Resource Strain: Not on file  Food Insecurity: Not on file  Transportation Needs: Not on file  Physical Activity: Not on file  Stress: Not on file  Social Connections: Not on file   Past Surgical History:  Procedure Laterality Date   SHOULDER ARTHROSCOPY WITH LABRAL REPAIR Left 08/11/2021   Procedure: LEFT SHOULDER ARTHROSCOPY WITH LABRAL REPAIR;  Surgeon: Cammy Copa, MD;  Location: [facility];  Service: Orthopedics;  Laterality: Left;   TONSILLECTOMY     TONSILLECTOMY AND ADENOIDECTOMY     Past Surgical History:  Procedure Laterality Date   SHOULDER ARTHROSCOPY WITH  LABRAL REPAIR Left 08/11/2021   Procedure: LEFT SHOULDER ARTHROSCOPY WITH LABRAL REPAIR;  Surgeon: Cammy Copa, MD;  Location: [facility] OR;  Service: Orthopedics;  Laterality: Left;   TONSILLECTOMY     TONSILLECTOMY AND ADENOIDECTOMY     Past Medical History:  Diagnosis Date   Anxiety and depression    no current medications   Asthma    Endometriosis    Environmental and seasonal allergies    PCOS (polycystic ovarian syndrome)    Seasonal allergies    BP 117/76   Pulse 94   Ht 5\' 10"  (1.778 m)   Wt 196 lb 12.8 oz (89.3 kg)   SpO2 98%   BMI 28.24 kg/m   Opioid Risk Score:   Fall Risk Score:  `1  Depression screen [facility] 2/9     08/03/2022   12:38 PM 07/04/2022   11:16 AM 07/04/2022   10:04 AM 06/28/2022   11:43 AM 06/28/2022   11:31 AM 06/22/2022   11:26 AM 05/16/2022     2:44 PM  Depression screen PHQ 2/9  Decreased Interest 1   1  1 2   Down, Depressed, Hopeless 1   1  1 1   PHQ - 2 Score 2   2  2 3   Altered sleeping  2  2  3 3   Tired, decreased energy  1  1  3 2   Change in appetite  0 0 1 1 0 1  Feeling bad or failure about yourself   1  1  1 1   Trouble concentrating  1 1 2 2 1 1   Moving slowly or fidgety/restless  0  0  0 1  Suicidal thoughts  0  0  0 0  PHQ-9 Score    9  10 12   Difficult doing work/chores  Somewhat difficult  Somewhat difficult  Somewhat difficult Very difficult      Review of Systems  Constitutional: Negative.   HENT: Negative.    Eyes: Negative.   Respiratory: Negative.    Cardiovascular: Negative.   Gastrointestinal: Negative.   Endocrine: Negative.   Genitourinary: Negative.   Musculoskeletal:  Positive for myalgias.  Skin: Negative.   Allergic/Immunologic: Negative.   Neurological: Negative.   Hematological: Negative.   Psychiatric/Behavioral:  Positive for dysphoric mood.   All other systems reviewed and are negative.      Objective:   Physical Exam Vitals and nursing note reviewed.  Constitutional:      Comments: Overweight BMI 28  HENT:     Head: Normocephalic and atraumatic.  Eyes:     Extraocular Movements: Extraocular movements intact.     Conjunctiva/sclera: Conjunctivae normal.     Pupils: Pupils are equal, round, and reactive to light.  Musculoskeletal:     Comments: Tenderness to even very light palpation in the thoracic paraspinal and lumbar paraspinals moderate tenderness over the greater trochanters bilaterally.  Skin:    General: Skin is warm and dry.  Neurological:     Mental Status: She is oriented to person, place, and time.     Comments: Motor strength is normal in the upper lower limbs Ambulates without assistive device no evidence of toe drag or knee instability  Psychiatric:        Mood and Affect: Mood normal.        Behavior: Behavior normal.           Assessment &  Plan:   1.  Fibromyalgia syndrome has had recent flareup due to rapid escalation of  ambulatory distance.  We discussed 10 %/week goal.  We discussed going back to the regimen she was on before her pain flareup and only going up very slowly in terms of duration i.e. 10 %/week We will also change her Lyrica from 150 mg 3 times daily to 225 mg twice daily Poor sleep related fibromyalgia syndrome will initiate Elavil 10 mg nightly Physical medicine rehab follow-up in 6 weeks

## 2022-08-22 ENCOUNTER — Other Ambulatory Visit: Payer: Self-pay | Admitting: Medical-Surgical

## 2022-08-22 DIAGNOSIS — F418 Other specified anxiety disorders: Secondary | ICD-10-CM

## 2022-08-25 ENCOUNTER — Encounter: Payer: Self-pay | Admitting: Medical-Surgical

## 2022-08-25 DIAGNOSIS — F418 Other specified anxiety disorders: Secondary | ICD-10-CM

## 2022-08-25 MED ORDER — BUSPIRONE HCL 15 MG PO TABS: 15.0000 mg | ORAL_TABLET | Freq: Three times a day (TID) | ORAL | 0 refills | Status: DC

## 2022-08-25 NOTE — Telephone Encounter (Signed)
I called patient. Sent prescription to [facility].

## 2022-08-29 ENCOUNTER — Other Ambulatory Visit: Payer: Self-pay | Admitting: Physical Medicine & Rehabilitation

## 2022-09-14 DIAGNOSIS — F331 Major depressive disorder, recurrent, moderate: Secondary | ICD-10-CM | POA: Diagnosis not present

## 2022-09-15 ENCOUNTER — Encounter: Payer: Self-pay | Admitting: Physical Medicine & Rehabilitation

## 2022-09-15 ENCOUNTER — Encounter: Payer: Medicaid Other | Attending: Physical Medicine & Rehabilitation | Admitting: Physical Medicine & Rehabilitation

## 2022-09-15 VITALS — BP 99/63 | HR 115 | Ht 70.0 in | Wt 193.6 lb

## 2022-09-15 DIAGNOSIS — M797 Fibromyalgia: Secondary | ICD-10-CM | POA: Diagnosis not present

## 2022-09-15 MED ORDER — PREGABALIN 200 MG PO CAPS: 200.0000 mg | ORAL_CAPSULE | Freq: Two times a day (BID) | ORAL | 1 refills | Status: DC

## 2022-09-15 MED ORDER — AMITRIPTYLINE HCL 25 MG PO TABS: 25.0000 mg | ORAL_TABLET | Freq: Every day | ORAL | 1 refills | Status: DC

## 2022-09-15 NOTE — Progress Notes (Signed)
Subjective:    Patient ID: Tonya Hart, female    DOB: [birth year], 31 y.o.   MRN: [id number]  HPI 31 year old female with history of fibromyalgia syndrome who returns here to discuss sleep and pain.  She had a long car ride up to [place] and had a flareup of her fibromyalgia pain during and following that trip.  She has 8 out of 10 pain she has her worst pain in the hip area bilaterally but then also in the upper back shoulders knees.  Morning daytime and nighttime hours are all painful she has sharp burning dull stabbing tingling aching pain.  Worse with walking bending sitting and activity standing improves with rest heat therapy and medication. Medication changes last visit included change from pregabalin 150 mg 3 times daily to pregabalin to 225 twice daily she feels like this was not as helpful Also started Elavil 10 mg nightly patient did not feel like this helped her sleep.  We discussed caffeine intake which is only 1 cup of coffee in the morning.  Patient also vapes we discussed that this could influence sleep as well in a negative manner Pain Inventory Average Pain 9 Pain Right Now 8 My pain is sharp, burning, dull, stabbing, tingling, and aching  In the last 24 hours, has pain interfered with the following? General activity 7 Relation with others 8 Enjoyment of life 7 What TIME of day is your pain at its worst? morning , daytime, and night Sleep (in general) Fair  Pain is worse with: walking, bending, sitting, inactivity, standing, and some activites Pain improves with: rest, heat/ice, therapy/exercise, and medication Relief from Meds: 3  Family History  Problem Relation Age of Onset   Thyroid disease Mother    Hyperlipidemia Mother    Hypertension Mother    Cataracts Mother    Hyperlipidemia Father    COPD Father    Heart attack Father    Hypertension Father    Diabetes Father    Prostate cancer Maternal Grandfather    Social History   Socioeconomic  History   Marital status: Single    Spouse name: Not on file   Number of children: 1   Years of education: Not on file   Highest education level: Not on file  Occupational History   Occupation: ISS    Employer: [employer]  Tobacco Use   Smoking status: Former    Types: Cigarettes    Quit date: 2019    Years since quitting: 4.8   Smokeless tobacco: Never  Vaping Use   Vaping Use: Every day   Last attempt to quit: 08/04/2020   Substances: Nicotine  Substance and Sexual Activity   Alcohol use: Not Currently    Comment: 3 drinks/week, beer or liquor   Drug use: Yes    Frequency: 7.0 times per week    Types: Marijuana   Sexual activity: Yes    Partners: Male    Birth control/protection: Pill  Other Topics Concern   Not on file  Social History Narrative   Right Handed    Lives in a one story home    Social Determinants of Health   Financial Resource Strain: Not on file  Food Insecurity: Not on file  Transportation Needs: Not on file  Physical Activity: Not on file  Stress: Not on file  Social Connections: Not on file   Past Surgical History:  Procedure Laterality Date   SHOULDER ARTHROSCOPY WITH LABRAL REPAIR Left 08/11/2021  Procedure: LEFT SHOULDER ARTHROSCOPY WITH LABRAL REPAIR;  Surgeon: Meredith Pel, MD;  Location: [facility];  Service: Orthopedics;  Laterality: Left;   TONSILLECTOMY     TONSILLECTOMY AND ADENOIDECTOMY     Past Surgical History:  Procedure Laterality Date   SHOULDER ARTHROSCOPY WITH LABRAL REPAIR Left 08/11/2021   Procedure: LEFT SHOULDER ARTHROSCOPY WITH LABRAL REPAIR;  Surgeon: Meredith Pel, MD;  Location: [facility];  Service: Orthopedics;  Laterality: Left;   TONSILLECTOMY     TONSILLECTOMY AND ADENOIDECTOMY     Past Medical History:  Diagnosis Date   Anxiety and depression    no current medications   Asthma    Endometriosis    Environmental and seasonal allergies    PCOS (polycystic ovarian syndrome)    Seasonal  allergies    BP 99/63   Pulse (!) 115   Ht 5\' 10"  (1.778 m)   Wt 193 lb 9.6 oz (87.8 kg)   SpO2 98%   BMI 27.78 kg/m   Opioid Risk Score:   Fall Risk Score:  `1  Depression screen [facility] 2/9     09/15/2022    2:01 PM 08/03/2022   12:38 PM 07/04/2022   11:16 AM 07/04/2022   10:04 AM 06/28/2022   11:43 AM 06/28/2022   11:31 AM 06/22/2022   11:26 AM  Depression screen PHQ 2/9  Decreased Interest 0 1   1  1   Down, Depressed, Hopeless 0 1   1  1   PHQ - 2 Score 0 2   2  2   Altered sleeping   2  2  3   Tired, decreased energy   1  1  3   Change in appetite   0 0 1 1 0  Feeling bad or failure about yourself    1  1  1   Trouble concentrating   1 1 2 2 1   Moving slowly or fidgety/restless   0  0  0  Suicidal thoughts   0  0  0  PHQ-9 Score     9  10  Difficult doing work/chores   Somewhat difficult  Somewhat difficult  Somewhat difficult     Review of Systems  Musculoskeletal:  Positive for back pain.       Bilateral leg pain Bilateral hip pain Bilateral knee pain  All other systems reviewed and are negative.     Objective:   Physical Exam Vitals and nursing note reviewed.  Constitutional:      Appearance: She is normal weight.  Eyes:     Extraocular Movements: Extraocular movements intact.     Conjunctiva/sclera: Conjunctivae normal.     Pupils: Pupils are equal, round, and reactive to light.  Musculoskeletal:     Comments: Tenderness with even very light palpation bilateral upper trap bilateral cervical thoracic and lumbar paraspinal bilateral greater trochanter of the hip. Lumbar range of motion is normal Negative straight leg raising bilaterally  Skin:    General: Skin is warm and dry.  Neurological:     Mental Status: She is alert and oriented to person, place, and time.  Psychiatric:        Mood and Affect: Mood normal.        Behavior: Behavior normal.           Assessment & Plan:    1.  Fibromyalgia syndrome some exacerbation following inactivity during  car ride. From a medication management standpoint we will change pregabalin to 200 mg 3 times daily Sleep disorder  related to fibromyalgia may have some influence from history of anxiety and depression as well as from nicotine use.  Will increase amitriptyline to 25 mg nightly Recommend patient continue to walk and do her yoga exercises. Return to clinic in 6 weeks

## 2022-09-15 NOTE — Patient Instructions (Signed)
Myofascial Pain Syndrome and Fibromyalgia ?Myofascial pain syndrome and fibromyalgia are both pain disorders. You may feel this pain mainly in your muscles. ?Myofascial pain syndrome: ?Always has tender points in the muscles that will cause pain when pressed (trigger points). The pain may come and go. ?Usually affects your neck, upper back, and shoulder areas. The pain often moves into your arms and hands. ?Fibromyalgia: ?Has muscle pains and tenderness that come and go. ?Is often associated with tiredness (fatigue) and sleep problems. ?Has trigger points. ?Tends to be long-lasting (chronic), but is not life-threatening. ?Fibromyalgia and myofascial pain syndrome are not the same. However, they often occur together. If you have both conditions, each can make the other worse. Both are common and can cause enough pain and fatigue to make day-to-day activities difficult. Both can be hard to diagnose because their symptoms are common in many other conditions. ?What are the causes? ?The exact causes of these conditions are not known. ?What increases the risk? ?You are more likely to develop either of these conditions if: ?You have a family history of the condition. ?You are female. ?You have certain triggers, such as: ?Spine disorders. ?An injury (trauma) or other physical stressors. ?Being under a lot of stress. ?Medical conditions such as osteoarthritis, rheumatoid arthritis, or lupus. ?What are the signs or symptoms? ?Fibromyalgia ?The main symptom of fibromyalgia is widespread pain and tenderness in your muscles. Pain is sometimes described as stabbing, shooting, or burning. ?You may also have: ?Tingling or numbness. ?Sleep problems and fatigue. ?Problems with attention and concentration (fibro fog). ?Other symptoms may include: ?Bowel and bladder problems. ?Headaches. ?Vision problems. ?Sensitivity to odors and noises. ?Depression or mood changes. ?Painful menstrual periods (dysmenorrhea). ?Dry skin or eyes. ?These  symptoms can vary over time. ?Myofascial pain syndrome ?Symptoms of myofascial pain syndrome include: ?Tight, ropy bands of muscle. ?Uncomfortable sensations in muscle areas. These may include aching, cramping, burning, numbness, tingling, and weakness. ?Difficulty moving certain parts of the body freely (poor range of motion). ?How is this diagnosed? ?This condition may be diagnosed by your symptoms and medical history. You will also have a physical exam. In general: ?Fibromyalgia is diagnosed if you have pain, fatigue, and other symptoms for more than 3 months, and symptoms cannot be explained by another condition. ?Myofascial pain syndrome is diagnosed if you have trigger points in your muscles, and those trigger points are tender and cause pain elsewhere in your body (referred pain). ?How is this treated? ?Treatment for these conditions depends on the type that you have. ?For fibromyalgia a healthy lifestyle is the most important treatment including aerobic and strength exercises. Different types of medicines are used to help treat pain and include: ?NSAIDs. ?Medicines for treating depression. ?Medicines that help control seizures. ?Medicines that relax the muscles. ?Treatment for myofascial pain syndrome includes: ?Pain medicines, such as NSAIDs. ?Cooling and stretching of muscles. ?Massage therapy with myofascial release technique. ?Trigger point injections. ?Treating these conditions often requires a team of health care providers. These may include: ?Your primary care provider. ?A physical therapist. ?Complementary health care providers, such as massage therapists or acupuncturists. ?A psychiatrist for cognitive behavioral therapy. ?Follow these instructions at home: ?Medicines ?Take over-the-counter and prescription medicines only as told by your health care provider. ?Ask your health care provider if the medicine prescribed to you: ?Requires you to avoid driving or using machinery. ?Can cause constipation.  You may need to take these actions to prevent or treat constipation: ?Drink enough fluid to keep your urine pale   yellow. ?Take over-the-counter or prescription medicines. ?Eat foods that are high in fiber, such as beans, whole grains, and fresh fruits and vegetables. ?Limit foods that are high in fat and processed sugars, such as fried or sweet foods. ?Lifestyle ? ?Do exercises as told by your health care provider or physical therapist. ?Practice relaxation techniques to control your stress. You may want to try: ?Biofeedback. ?Visual imagery. ?Hypnosis. ?Muscle relaxation. ?Yoga. ?Meditation. ?Maintain a healthy lifestyle. This includes eating a healthy diet and getting enough sleep. ?Do not use any products that contain nicotine or tobacco. These products include cigarettes, chewing tobacco, and vaping devices, such as e-cigarettes. If you need help quitting, ask your health care provider. ?General instructions ?Talk to your health care provider about complementary treatments, such as acupuncture or massage. ?Do not do activities that stress or strain your muscles. This includes repetitive motions and heavy lifting. ?Keep all follow-up visits. This is important. ?Where to find support ?Consider joining a support group with others who are diagnosed with this condition. ?National Fibromyalgia Association: www.fmaware.org ?Where to find more information ?American Chronic Pain Association: www.theacpa.org ?Contact a health care provider if: ?You have new symptoms. ?Your symptoms get worse or your pain is severe. ?You have side effects from your medicines. ?You have trouble sleeping. ?Your condition is causing depression or anxiety. ?Get help right away if: ?You have thoughts of hurting yourself or others. ?Get help right awayif you feel like you may hurt yourself or others, or have thoughts about taking your own life. Go to your nearest emergency room or: ?Call 911. ?Call the National Suicide Prevention Lifeline at  1-800-273-8255 or 988. This is open 24 hours a day. ?Text the Crisis Text Line at 741741. ?Summary ?Myofascial pain syndrome and fibromyalgia are pain disorders. ?Myofascial pain syndrome has tender points in the muscles that will cause pain when pressed (trigger points). Fibromyalgia also has muscle pains and tenderness that come and go, but this condition is often associated with fatigue and sleep disturbances. ?Fibromyalgia and myofascial pain syndrome are not the same but often occur together, causing pain and fatigue that make day-to-day activities difficult. ?Follow your health care provider's instructions for taking medicines and maintaining a healthy lifestyle. ?This information is not intended to replace advice given to you by your health care provider. Make sure you discuss any questions you have with your health care provider. ?Document Revised: 10/07/2021 Document Reviewed: 10/07/2021 ?Elsevier Patient Education ? 2023 Elsevier Inc. ? ?

## 2022-09-25 ENCOUNTER — Encounter: Payer: Self-pay | Admitting: Obstetrics & Gynecology

## 2022-09-25 ENCOUNTER — Ambulatory Visit (INDEPENDENT_AMBULATORY_CARE_PROVIDER_SITE_OTHER): Payer: Medicaid Other | Admitting: Obstetrics & Gynecology

## 2022-09-25 ENCOUNTER — Encounter: Payer: Self-pay | Admitting: Physical Medicine & Rehabilitation

## 2022-09-25 VITALS — BP 115/78 | HR 80 | Temp 98.4°F | Resp 16 | Ht 69.0 in | Wt 195.0 lb

## 2022-09-25 DIAGNOSIS — N61 Mastitis without abscess: Secondary | ICD-10-CM | POA: Diagnosis not present

## 2022-09-25 MED ORDER — PREGABALIN 200 MG PO CAPS: 200.0000 mg | ORAL_CAPSULE | Freq: Three times a day (TID) | ORAL | 1 refills | Status: DC

## 2022-09-25 NOTE — Progress Notes (Signed)
   Subjective:    Patient ID: Tonya Hart, female    DOB: Oct 16, 1991, 31 y.o.   MRN: [id number]  HPI  Pt presents for 3 day history of bloody, pus d/c from right breast.  Saw [facility] yesterday and was started on bactrim for 10 days.  Had nipple piercing which were removed 4-5 years ago.  Pt also had pp mastitis in right breast last year.   Review of Systems  Constitutional:  Positive for chills and fever.  Respiratory: Negative.    Cardiovascular: Negative.   Gastrointestinal: Negative.   Genitourinary: Negative.        Breast pain right      Objective:   Physical Exam Vitals reviewed.  Constitutional:      General: She is not in acute distress.    Appearance: She is well-developed.  HENT:     Head: Normocephalic and atraumatic.  Eyes:     Conjunctiva/sclera: Conjunctivae normal.  Cardiovascular:     Rate and Rhythm: Normal rate.  Pulmonary:     Effort: Pulmonary effort is normal.  Chest:    Skin:    General: Skin is warm and dry.  Neurological:     Mental Status: She is alert and oriented to person, place, and time.  Psychiatric:        Mood and Affect: Mood normal.    Vitals:   09/25/22 1044  BP: 115/78  Pulse: 80  Resp: 16  Temp: 98.4 F (36.9 C)  Weight: 195 lb (88.5 kg)  Height: 5\' 9"  (1.753 m)       Assessment & Plan:  31 yo female with right breast pain and bloody/pus discharge and presumed diagnosis of acute mastitis.    Continue Bactrim Rt breast US ordered Needs annual at her convenience.

## 2022-09-28 ENCOUNTER — Ambulatory Visit
Admission: RE | Admit: 2022-09-28 | Discharge: 2022-09-28 | Disposition: A | Discharge status: Home / Self Care | Payer: Medicaid Other | Source: Ambulatory Visit | Attending: Obstetrics & Gynecology | Admitting: Obstetrics & Gynecology

## 2022-09-28 DIAGNOSIS — N61 Mastitis without abscess: Secondary | ICD-10-CM

## 2022-09-28 DIAGNOSIS — N6489 Other specified disorders of breast: Secondary | ICD-10-CM | POA: Diagnosis not present

## 2022-10-05 DIAGNOSIS — F331 Major depressive disorder, recurrent, moderate: Secondary | ICD-10-CM | POA: Diagnosis not present

## 2022-10-10 ENCOUNTER — Encounter: Payer: Self-pay | Admitting: Obstetrics and Gynecology

## 2022-10-10 ENCOUNTER — Ambulatory Visit: Payer: Medicaid Other | Admitting: Obstetrics and Gynecology

## 2022-10-10 VITALS — BP 113/77 | HR 105 | Temp 98.1°F | Ht 69.0 in | Wt 197.0 lb

## 2022-10-10 DIAGNOSIS — S20111D Abrasion of breast, right breast, subsequent encounter: Secondary | ICD-10-CM | POA: Diagnosis not present

## 2022-10-10 DIAGNOSIS — L089 Local infection of the skin and subcutaneous tissue, unspecified: Secondary | ICD-10-CM

## 2022-10-10 NOTE — Progress Notes (Signed)
[name] is a 31 y.o. female G1P1001 here for follow up regarding breast infection/ bloody drainage. She was seen in our office on 11/6. She completed a course of antibiotics and had a negative breast US. She continue to have scant amount of blood drainage from right breast only. No fever, no pain. No odor.   She attests to Completion of antibiotics 1 week ago Symptoms have improved significantly.  No fever; highest temp was 99.0 in the last 24 hours.    Keep annual exam scheduled for next week.  Infected breast abrasion, right, subsequent encounter - Plan: Wound culture, Ambulatory referral to Lactation   Culture pending.  Duane Lope, NP 10/10/2022 9:34 AM

## 2022-10-12 ENCOUNTER — Other Ambulatory Visit: Payer: Self-pay | Admitting: Physical Medicine & Rehabilitation

## 2022-10-13 LAB — WOUND CULTURE
MICRO NUMBER:: 14223255
SPECIMEN QUALITY:: ADEQUATE

## 2022-10-16 ENCOUNTER — Encounter: Payer: Self-pay | Admitting: Obstetrics & Gynecology

## 2022-10-16 ENCOUNTER — Ambulatory Visit: Payer: Medicaid Other | Admitting: Obstetrics & Gynecology

## 2022-10-16 VITALS — BP 121/79 | HR 98 | Ht 69.0 in | Wt 200.0 lb

## 2022-10-16 DIAGNOSIS — N644 Mastodynia: Secondary | ICD-10-CM

## 2022-10-16 DIAGNOSIS — Z01419 Encounter for gynecological examination (general) (routine) without abnormal findings: Secondary | ICD-10-CM

## 2022-10-16 DIAGNOSIS — N6452 Nipple discharge: Secondary | ICD-10-CM | POA: Diagnosis not present

## 2022-10-16 DIAGNOSIS — R109 Unspecified abdominal pain: Secondary | ICD-10-CM

## 2022-10-16 LAB — POCT URINE PREGNANCY: Preg Test, Ur: NEGATIVE

## 2022-10-16 NOTE — Progress Notes (Signed)
Last Mammogram: n/a Last Pap Smear:  04/05/20- negative Last Colon Screening;  n/a Seat Belts:   yes Sun Screen:   yes Dental Check Up:  yes Brush & Floss:  yes

## 2022-10-16 NOTE — Progress Notes (Signed)
Subjective:     Tonya Hart is a 31 y.o. female here for a routine exam.  Current complaints: continue right nipple bloody discharge and pus.    Gynecologic History Patient's last menstrual period was 09/22/2022. Contraception: none Last Mammogram: n/a Last Pap Smear:  04/05/20- negative Last Colon Screening;  n/a Seat Belts:   yes Sun Screen:   yes Dental Check Up:  yes Brush & Floss:  yes     Obstetric History OB History  Gravida Para Term Preterm AB Living  1 1 1  0 0 1  SAB IAB Ectopic Multiple Live Births  0 0 0 0 1    # Outcome Date GA Lbr Len/2nd Weight Sex Delivery Anes PTL Lv  1 Term 04/01/21 [redacted]w[redacted]d 01:24 / 01:26 8 lb 1.8 oz (3.68 kg) F Vag-Spont EPI  LIV     The following portions of the patient's history were reviewed and updated as appropriate: allergies, current medications, past family history, past medical history, past social history, past surgical history, and problem list.  Review of Systems Pertinent items noted in HPI and remainder of comprehensive ROS otherwise negative.    Objective:   Vitals:   10/16/22 0954  BP: 121/79  Pulse: 98  Weight: 200 lb (90.7 kg)  Height: 5\' 9"  (1.753 m)   Vitals:  WNL General appearance: alert, cooperative and no distress  HEENT: Normocephalic, without obvious abnormality, atraumatic Eyes: negative Throat: lips, mucosa, and tongue normal; teeth and gums normal  Respiratory: Clear to auscultation bilaterally  CV: Regular rate and rhythm  Breasts:  Right breast tender and crusting at sites of old piercing.   GI: Soft, non-tender; bowel sounds normal; no masses,  no organomegaly  GU: External Genitalia:  Tanner V, no lesion Urethra:  No prolapse   Vagina: Pink, normal rugae, no blood or discharge  Cervix: No CMT, no lesion  Uterus:  Normal size and contour, non tender  Adnexa: Normal, no masses, non tender  Musculoskeletal: No edema, redness or tenderness in the calves or thighs  Skin: No lesions or rash   Lymphatic: Axillary adenopathy: none     Psychiatric: Normal mood and behavior      Assessment:    Healthy female exam.    Plan:    UPT on patient request--negative Referral to breast surgeon for continued breast d/s Reviewed risk of endometrial cancer with prolonged anovulation  Check tsh and prolactin PNV daily in case conception occurs.  Patient needed metformin the last time she became pregnant.  She can message 10/18/22 if she would like to restart that.

## 2022-10-17 DIAGNOSIS — F331 Major depressive disorder, recurrent, moderate: Secondary | ICD-10-CM | POA: Diagnosis not present

## 2022-10-17 LAB — PROLACTIN: Prolactin: 4.3 ng/mL

## 2022-10-17 LAB — TSH: TSH: 1.25 mIU/L

## 2022-10-24 DIAGNOSIS — F331 Major depressive disorder, recurrent, moderate: Secondary | ICD-10-CM | POA: Diagnosis not present

## 2022-10-27 ENCOUNTER — Encounter: Payer: Self-pay | Admitting: Physical Medicine & Rehabilitation

## 2022-10-27 ENCOUNTER — Ambulatory Visit
Admission: RE | Admit: 2022-10-27 | Discharge: 2022-10-27 | Disposition: A | Discharge status: Home / Self Care | Payer: Medicaid Other | Source: Ambulatory Visit | Attending: Physical Medicine & Rehabilitation | Admitting: Physical Medicine & Rehabilitation

## 2022-10-27 ENCOUNTER — Encounter: Payer: Medicaid Other | Attending: Physical Medicine & Rehabilitation | Admitting: Physical Medicine & Rehabilitation

## 2022-10-27 VITALS — BP 111/73 | HR 97 | Ht 69.0 in | Wt 203.6 lb

## 2022-10-27 DIAGNOSIS — M5440 Lumbago with sciatica, unspecified side: Secondary | ICD-10-CM

## 2022-10-27 DIAGNOSIS — M545 Low back pain, unspecified: Secondary | ICD-10-CM | POA: Diagnosis not present

## 2022-10-27 MED ORDER — TIZANIDINE HCL 2 MG PO TABS: 4.0000 mg | ORAL_TABLET | Freq: Four times a day (QID) | ORAL | 1 refills | Status: DC | PRN

## 2022-10-27 MED ORDER — TIZANIDINE HCL 2 MG PO TABS: 2.0000 mg | ORAL_TABLET | Freq: Four times a day (QID) | ORAL | 1 refills | Status: DC | PRN

## 2022-10-27 NOTE — Progress Notes (Unsigned)
Subjective:    Patient ID: Tonya Hart, female    DOB: 1991-04-08, 31 y.o.   MRN: [id number]  HPI 31 year old female with fibromyalgia syndrome.  She returns today having had a flareup of her pain.  She complains of pain at bilateral shoulders left arm bilateral legs and right hip.  She also complains of right-sided low back pain.  She has no falls or trauma.  No bowel or bladder dysfunction.  No progressive weakness in the lower extremities. At last visit on 09/15/2022 pregabalin was increased to 200 mg 3 times daily.  Amitriptyline increased to 25 mg nightly. The patient has not been sleeping well.  She has had increased childcare duties due to her significant other going back to work. She generally does yoga in the mornings although not as much recently. Remains modified independent with all self-care and mobility Pain Inventory Average Pain 7 Pain Right Now 9 My pain is sharp, burning, dull, stabbing, tingling, and aching  In the last 24 hours, has pain interfered with the following? General activity 7 Relation with others 7 Enjoyment of life 7 What TIME of day is your pain at its worst? morning , daytime, evening, and night Sleep (in general) Poor  Pain is worse with: walking, bending, sitting, inactivity, standing, and some activites Pain improves with: rest and heat/ice Relief from Meds: 0  Family History  Problem Relation Age of Onset   Thyroid disease Mother    Hyperlipidemia Mother    Hypertension Mother    Cataracts Mother    Hyperlipidemia Father    COPD Father    Heart attack Father    Hypertension Father    Diabetes Father    Prostate cancer Maternal Grandfather    Social History   Socioeconomic History   Marital status: Single    Spouse name: Not on file   Number of children: 1   Years of education: Not on file   Highest education level: Not on file  Occupational History   Occupation: ISS    Employer: [employer]  Tobacco Use   Smoking  status: Former    Types: Cigarettes    Quit date: 2019    Years since quitting: 4.9   Smokeless tobacco: Never  Vaping Use   Vaping Use: Every day   Last attempt to quit: 08/04/2020   Substances: Nicotine  Substance and Sexual Activity   Alcohol use: Not Currently    Comment: 3 drinks/week, beer or liquor   Drug use: Yes    Frequency: 7.0 times per week    Types: Marijuana   Sexual activity: Yes    Partners: Male    Birth control/protection: None  Other Topics Concern   Not on file  Social History Narrative   Right Handed    Lives in a one story home    Social Determinants of Health   Financial Resource Strain: Not on file  Food Insecurity: Not on file  Transportation Needs: Not on file  Physical Activity: Not on file  Stress: Not on file  Social Connections: Not on file   Past Surgical History:  Procedure Laterality Date   SHOULDER ARTHROSCOPY WITH LABRAL REPAIR Left 08/11/2021   Procedure: LEFT SHOULDER ARTHROSCOPY WITH LABRAL REPAIR;  Surgeon: Cammy Copa, MD;  Location: [facility] OR;  Service: Orthopedics;  Laterality: Left;   TONSILLECTOMY     TONSILLECTOMY AND ADENOIDECTOMY     Past Surgical History:  Procedure Laterality Date   SHOULDER ARTHROSCOPY WITH LABRAL REPAIR  Left 08/11/2021   Procedure: LEFT SHOULDER ARTHROSCOPY WITH LABRAL REPAIR;  Surgeon: Cammy Copa, MD;  Location: [facility] OR;  Service: Orthopedics;  Laterality: Left;   TONSILLECTOMY     TONSILLECTOMY AND ADENOIDECTOMY     Past Medical History:  Diagnosis Date   Anxiety and depression    no current medications   Asthma    Endometriosis    Environmental and seasonal allergies    PCOS (polycystic ovarian syndrome)    Seasonal allergies    BP 111/73   Pulse 97   Ht 5\' 9"  (1.753 m)   Wt 203 lb 9.6 oz (92.4 kg)   SpO2 98%   BMI 30.07 kg/m   Opioid Risk Score:   Fall Risk Score:  `1  Depression screen [facility] 2/9     09/15/2022    2:01 PM 08/03/2022   12:38 PM 07/04/2022   11:16 AM  07/04/2022   10:04 AM 06/28/2022   11:43 AM 06/28/2022   11:31 AM 06/22/2022   11:26 AM  Depression screen PHQ 2/9  Decreased Interest 0 1   1  1   Down, Depressed, Hopeless 0 1   1  1   PHQ - 2 Score 0 2   2  2   Altered sleeping   2  2  3   Tired, decreased energy   1  1  3   Change in appetite   0 0 1 1 0  Feeling bad or failure about yourself    1  1  1   Trouble concentrating   1 1 2 2 1   Moving slowly or fidgety/restless   0  0  0  Suicidal thoughts   0  0  0  PHQ-9 Score     9  10  Difficult doing work/chores   Somewhat difficult  Somewhat difficult  Somewhat difficult     Review of Systems  Musculoskeletal:  Positive for back pain.       Bilateral leg pain Left arm pain Right shoulder pain  All other systems reviewed and are negative.     Objective:   Physical Exam Mood and affect are anxious Extremities without clubbing cyanosis or edema Motor strength is 5/5 bilateral deltoid by stress of grip hip flexion knee extension ankle dorsiflexion Negative straight leg raising bilaterally Tenderness palpation bilateral upper traps bilateral lumbar spine as well as right hip greater trochanter greater than right gluteus medius area. Ambulates without assist device no evidence of toe drag or knee instability.       Assessment & Plan:   #1.  Fibromyalgia syndrome with current flare likely due to increased activity level plus poor sleep hygiene.  She is already on tricyclic antidepressant on top of Lexapro 20 mg/day prescribed by primary care would not want to go up on this due to increasing risk of excess serotonin.  Similarly would avoid tramadol for now given these other medications. For the flareup we can add tizanidine 2 mg twice daily as needed.  Monitor for sedation. Exercise given for right trochanteric bursitis Encouraged activity Will x-ray lumbar spine given current complaints  Xrays reviewed - Good alignment, good maintenance of disc spaces,  No evidence of vertebral  fracture, essentially normal study, radiology report pending

## 2022-10-27 NOTE — Patient Instructions (Signed)
Hip Bursitis Rehab Ask your health care provider which exercises are safe for you. Do exercises exactly as told by your health care provider and adjust them as directed. It is normal to feel mild stretching, pulling, tightness, or discomfort as you do these exercises. Stop right away if you feel sudden pain or your pain gets worse. Do not begin these exercises until told by your health care provider. Stretching exercise This exercise warms up your muscles and joints and improves the movement and flexibility of your hip. This exercise also helps to relieve pain and stiffness. Iliotibial band stretch An iliotibial band is a strong band of muscle tissue that runs from the outer side of your hip to the outer side of your thigh and knee. Lie on your side with your left / right leg in the top position. Bend your left / right knee and grab your ankle. Stretch out your bottom arm to help you balance. Slowly bring your knee back so your thigh is slightly behind your body. Slowly lower your knee toward the floor until you feel a gentle stretch on the outside of your left / right thigh. If you do not feel a stretch and your knee will not lower more toward the floor, place the heel of your other foot on top of your knee and pull your knee down toward the floor with your foot. Hold this position for __________ seconds. Slowly return to the starting position. Repeat __________ times. Complete this exercise __________ times a day. Strengthening exercises These exercises build strength and endurance in your hip and pelvis. Endurance is the ability to use your muscles for a long time, even after they get tired. Bridge This exercise strengthens the muscles that move your thigh backward (hip extensors). Lie on your back on a firm surface with your knees bent and your feet flat on the floor. Tighten your buttocks muscles and lift your buttocks off the floor until your trunk is level with your thighs. Do not arch your  back. You should feel the muscles working in your buttocks and the back of your thighs. If you do not feel these muscles, slide your feet 1-2 inches (2.5-5 cm) farther away from your buttocks. If this exercise is too easy, try doing it with your arms crossed over your chest. Hold this position for __________ seconds. Slowly lower your hips to the starting position. Let your muscles relax completely after each repetition. Repeat __________ times. Complete this exercise __________ times a day. Squats This exercise strengthens the muscles in front of your thigh and knee (quadriceps). Stand in front of a table, with your feet and knees pointing straight ahead. You may rest your hands on the table for balance but not for support. Slowly bend your knees and lower your hips like you are going to sit in a chair. Keep your weight over your heels, not over your toes. Keep your lower legs upright so they are parallel with the table legs. Do not let your hips go lower than your knees. Do not bend lower than told by your health care provider. If your hip pain increases, do not bend as low. Hold the squat position for __________ seconds. Slowly push with your legs to return to standing. Do not use your hands to pull yourself to standing. Repeat __________ times. Complete this exercise __________ times a day. Hip hike  Stand sideways on a bottom step. Stand on your left / right leg with your other foot unsupported next to   the step. You can hold on to the railing or wall for balance if needed. Keep your knees straight and your torso square. Then lift your left / right hip up toward the ceiling. Hold this position for __________ seconds. Slowly let your left / right hip lower toward the floor, past the starting position. Your foot should get closer to the floor. Do not lean or bend your knees. Repeat __________ times. Complete this exercise __________ times a day. Single leg stand This exercise increases  your balance. Without shoes, stand near a railing or in a doorway. You may hold on to the railing or door frame as needed for balance. Squeeze your left / right buttock muscles, then lift up your other foot. Do not let your left / right hip push out to the side. It is helpful to stand in front of a mirror for this exercise so you can watch your hip. Hold this position for __________ seconds. Repeat __________ times. Complete this exercise __________ times a day. This information is not intended to replace advice given to you by your health care provider. Make sure you discuss any questions you have with your health care provider. Document Revised: 10/19/2021 Document Reviewed: 10/19/2021 Elsevier Patient Education  2023 [employer] Inc[employer]  

## 2022-10-28 ENCOUNTER — Encounter: Payer: Self-pay | Admitting: Physical Medicine & Rehabilitation

## 2022-10-31 DIAGNOSIS — F331 Major depressive disorder, recurrent, moderate: Secondary | ICD-10-CM | POA: Diagnosis not present

## 2022-11-07 ENCOUNTER — Other Ambulatory Visit: Payer: Self-pay | Admitting: Physical Medicine & Rehabilitation

## 2022-11-14 ENCOUNTER — Encounter: Payer: Self-pay | Admitting: Medical-Surgical

## 2022-11-16 ENCOUNTER — Other Ambulatory Visit: Payer: Self-pay

## 2022-11-16 ENCOUNTER — Encounter: Payer: Self-pay | Admitting: Physical Medicine & Rehabilitation

## 2022-11-16 DIAGNOSIS — O219 Vomiting of pregnancy, unspecified: Secondary | ICD-10-CM

## 2022-11-16 MED ORDER — PROMETHAZINE HCL 25 MG PO TABS: 25.0000 mg | ORAL_TABLET | Freq: Four times a day (QID) | ORAL | 0 refills | Status: DC | PRN

## 2022-11-16 NOTE — Progress Notes (Signed)
Pt scheduled for NOB appt on 1/31 with Donette Larry, CNM. Pt called requesting nausea meds. Phenergan sent per protocol book.

## 2022-11-20 NOTE — L&D Delivery Note (Signed)
Delivery Note Tonya Hart is a 32 y.o. G2P1001 at 108w2d admitted for elective IOL.   GBS Status: Negative/-- (07/25 6213)  Labor course: Initial SVE: 2.5. Augmentation with: AROM, Pitocin, and Cytotec. She then progressed to complete.  ROM: 4h 86m with clear fluid  Birth: At 1926 a viable female was delivered via spontaneous vaginal delivery ROA. Nuchal cord present: Yes x1 delivered through.  Shoulders and body delivered in usual fashion. Infant placed directly on mom's abdomen for bonding/skin-to-skin, baby dried and stimulated. Cord clamped x 2 after 1 minute and cut by FOB.  Cord blood collected.  The placenta separated spontaneously and delivered via gentle cord traction.  Pitocin infused rapidly IV per protocol.  Fundus firm with massage.  Placenta inspected and appears to be intact with a 3 VC.  Placenta/Cord with the following complications: none .  Cord pH: none Sponge and instrument count were correct x2.  Intrapartum complications:  None Anesthesia:  epidural Episiotomy: none Lacerations:  labial right repaired  Suture Repair: 3.0 vicryl EBL (mL): 107.00    Infant: APGAR (1 MIN): 8  APGAR (5 MINS): 9  APGAR (10 MINS):    Infant weight: pending  Delivery Report: Review the Delivery Report for details.    Mom to postpartum.  Baby to Couplet care / Skin to Skin. Placenta to L&D   Plans to Breastfeed Contraception: POPs Circumcision: wants inpatient  Note sent to [facility]: [facility] for pp visit.  Cleda Mccreedy [facility] 07/09/2023 7:48 PM

## 2022-12-18 ENCOUNTER — Encounter: Payer: Self-pay | Admitting: *Deleted

## 2022-12-20 ENCOUNTER — Encounter: Payer: Self-pay | Admitting: Certified Nurse Midwife

## 2022-12-20 ENCOUNTER — Ambulatory Visit (INDEPENDENT_AMBULATORY_CARE_PROVIDER_SITE_OTHER): Payer: Medicaid Other

## 2022-12-20 ENCOUNTER — Ambulatory Visit (INDEPENDENT_AMBULATORY_CARE_PROVIDER_SITE_OTHER): Payer: Medicaid Other | Admitting: Certified Nurse Midwife

## 2022-12-20 ENCOUNTER — Other Ambulatory Visit (HOSPITAL_COMMUNITY)
Admission: RE | Admit: 2022-12-20 | Discharge: 2022-12-20 | Disposition: A | Discharge status: Home / Self Care | Payer: Medicaid Other | Source: Ambulatory Visit | Attending: Certified Nurse Midwife | Admitting: Certified Nurse Midwife

## 2022-12-20 VITALS — BP 116/66 | HR 73 | Wt 195.0 lb

## 2022-12-20 DIAGNOSIS — Z8659 Personal history of other mental and behavioral disorders: Secondary | ICD-10-CM

## 2022-12-20 DIAGNOSIS — Z3A10 10 weeks gestation of pregnancy: Secondary | ICD-10-CM | POA: Diagnosis not present

## 2022-12-20 DIAGNOSIS — Z3480 Encounter for supervision of other normal pregnancy, unspecified trimester: Secondary | ICD-10-CM | POA: Insufficient documentation

## 2022-12-20 DIAGNOSIS — Z8759 Personal history of other complications of pregnancy, childbirth and the puerperium: Secondary | ICD-10-CM

## 2022-12-20 DIAGNOSIS — M797 Fibromyalgia: Secondary | ICD-10-CM | POA: Diagnosis not present

## 2022-12-20 DIAGNOSIS — Z23 Encounter for immunization: Secondary | ICD-10-CM

## 2022-12-20 DIAGNOSIS — Z3481 Encounter for supervision of other normal pregnancy, first trimester: Secondary | ICD-10-CM

## 2022-12-20 DIAGNOSIS — G43901 Migraine, unspecified, not intractable, with status migrainosus: Secondary | ICD-10-CM

## 2022-12-20 DIAGNOSIS — Z3A11 11 weeks gestation of pregnancy: Secondary | ICD-10-CM | POA: Diagnosis not present

## 2022-12-20 DIAGNOSIS — F419 Anxiety disorder, unspecified: Secondary | ICD-10-CM

## 2022-12-20 MED ORDER — METOCLOPRAMIDE HCL 10 MG PO TABS: 10.0000 mg | ORAL_TABLET | Freq: Four times a day (QID) | ORAL | 0 refills | Status: DC | PRN

## 2022-12-20 MED ORDER — ASPIRIN 81 MG PO CHEW: 81.0000 mg | CHEWABLE_TABLET | Freq: Every day | ORAL | 3 refills | Status: DC

## 2022-12-20 NOTE — Patient Instructions (Signed)

## 2022-12-20 NOTE — Progress Notes (Signed)
Subjective:   Tonya Hart is a 32 y.o. G2P1001 at [redacted]w[redacted]d by early ultrasound being seen today for her first obstetrical visit.  Her obstetrical history is significant for  hx of gHTN . Patient does intend to breast feed. Pregnancy history fully reviewed.  Patient reports nausea and frequent migraines . Reports worsening mood since off meds.   HISTORY: OB History  Gravida Para Term Preterm AB Living  2 1 1  0 0 1  SAB IAB Ectopic Multiple Live Births  0 0 0 0 1    # Outcome Date GA Lbr Len/2nd Weight Sex Delivery Anes PTL Lv  2 Current           1 Term 04/01/21 [redacted]w[redacted]d 01:24 / 01:26 8 lb 1.8 oz (3.68 kg) F Vag-Spont EPI  LIV     Name: Malbrough,GIRLA Maysie     Apgar1: 8  Apgar5: 9   Past Medical History:  Diagnosis Date   Anxiety and depression    no current medications   Asthma    Endometriosis    Environmental and seasonal allergies    Gestational hypertension    PCOS (polycystic ovarian syndrome)    Seasonal allergies    Past Surgical History:  Procedure Laterality Date   SHOULDER ARTHROSCOPY WITH LABRAL REPAIR Left 08/11/2021   Procedure: LEFT SHOULDER ARTHROSCOPY WITH LABRAL REPAIR;  Surgeon: Meredith Pel, MD;  Location: [place];  Service: Orthopedics;  Laterality: Left;   TONSILLECTOMY     TONSILLECTOMY AND ADENOIDECTOMY     Family History  Problem Relation Age of Onset   Thyroid disease Mother    Hyperlipidemia Mother    Hypertension Mother    Cataracts Mother    Hyperlipidemia Father    COPD Father    Heart attack Father    Hypertension Father    Diabetes Father    Prostate cancer Maternal Grandfather    Social History   Tobacco Use   Smoking status: Former    Types: Cigarettes    Quit date: 2019    Years since quitting: 5.0   Smokeless tobacco: Never  Vaping Use   Vaping Use: Every day   Last attempt to quit: 08/04/2020   Substances: Nicotine  Substance Use Topics   Alcohol use: Not Currently    Comment: 3 drinks/week, beer or liquor    Drug use: Yes    Frequency: 7.0 times per week    Types: Marijuana   Allergies  Allergen Reactions   Chicken Meat (Diagnostic) Anaphylaxis   Bee Venom Hives    swelling   Latex Hives   Soap Hives    Dove soap   Current Outpatient Medications on File Prior to Visit  Medication Sig Dispense Refill   promethazine (PHENERGAN) 25 MG tablet Take 1 tablet (25 mg total) by mouth every 6 (six) hours as needed for nausea or vomiting. 30 tablet 0   albuterol (VENTOLIN HFA) 108 (90 Base) MCG/ACT inhaler Inhale 2 puffs into the lungs every 6 (six) hours as needed for wheezing. (Patient not taking: Reported on 12/20/2022) 1 each 11   EPINEPHrine 0.3 mg/0.3 mL IJ SOAJ injection EpiPen for hypersensitivity reaction. For pediatric patients 15-29 kg, use 0.15 mg dose. For pediatric patients =/> 30 kg and adult patients, use 0.3 mg dose. (Patient not taking: Reported on 12/20/2022) 2 each 11   Prenatal Vit-Fe Fumarate-FA (MULTIVITAMIN-PRENATAL) 27-0.8 MG TABS tablet Take 1 tablet by mouth daily at 12 noon. (Patient not taking: Reported on 12/20/2022)  No current facility-administered medications on file prior to visit.     Indications for ASA therapy (per uptodate) One of the following: Previous pregnancy with preeclampsia, especially early onset and with an adverse outcome Yes Multifetal gestation No Chronic hypertension No Type 1 or 2 diabetes mellitus No Chronic kidney disease No Autoimmune disease (antiphospholipid syndrome, systemic lupus erythematosus) No  Two or more of the following: Nulliparity No Obesity (body mass index >30 kg/m2) No Family history of preeclampsia in mother or sister No Age ?35 years No Sociodemographic characteristics (African American race, low socioeconomic level) No Personal risk factors (eg, previous pregnancy with low birth weight or small for gestational age infant, previous adverse pregnancy outcome [eg, stillbirth], interval >10 years between pregnancies) No    Indications for early 1 hour GTT (per uptodate)  BMI >25 (>23 in Asian women) AND one of the following  Gestational diabetes mellitus in a previous pregnancy No Glycated hemoglobin ?5.7 percent (39 mmol/mol), impaired glucose tolerance, or impaired fasting glucose on previous testing No First-degree relative with diabetes Yes High-risk race/ethnicity (eg, African American, Latino, Native American, [place] American, Pacific Islander) No History of cardiovascular disease No Hypertension or on therapy for hypertension No High-density lipoprotein cholesterol level <35 mg/dL (0.90 mmol/L) and/or a triglyceride level >250 mg/dL (2.82 mmol/L) No Polycystic ovary syndrome Yes Physical inactivity No Other clinical condition associated with insulin resistance (eg, severe obesity, acanthosis nigricans) No Previous birth of an infant weighing ?4000 g No Previous stillbirth of unknown cause No  Exam   Vitals:   12/20/22 1303  BP: 116/66  Pulse: 73  Weight: 195 lb (88.5 kg)      Uterus:     Pelvic Exam: Perineum: deferred   Vulva:    Vagina:     Cervix:    Adnexa:    Bony Pelvis:   System: General: well-developed, well-nourished female in no acute distress   Breast:  normal appearance, no masses or tenderness   Skin: normal coloration and turgor, no rashes   Neurologic: oriented, normal, negative, normal mood   Extremities: normal strength, tone, and muscle mass, ROM of all joints is normal   HEENT PERRLA, extraocular movement intact and sclera clear, anicteric   Mouth/Teeth mucous membranes moist, pharynx normal without lesions and dental hygiene good   Neck supple and no masses   Cardiovascular: regular rate and rhythm   Respiratory:  no respiratory distress, normal breath sounds   Abdomen: soft, non-tender; bowel sounds normal; no masses,  no organomegaly     Assessment:   Pregnancy: G2P1001 Patient Active Problem List   Diagnosis Date Noted   History of pregnancy induced  hypertension 12/20/2022   Fibromyalgia 12/20/2022   Anxiety 12/20/2022   Instability of left shoulder joint    Recurrent anterior dislocation of shoulder, left 05/13/2021   Dysmenorrhea 05/18/2020   Depression with anxiety 04/04/2020   Anxiety state 04/04/2020   Cigarette nicotine dependence without complication 56/21/3086     Plan:  1. Supervision of other normal pregnancy, antepartum - US OB Limited; Future - Papsmear  2. History of pregnancy induced hypertension -baseline labs -Rx bASA  3. [redacted] weeks gestation of pregnancy  4. Fibromyalgia - stopped all meds when she found out pregnant  5. Hx of anxiety - positive depression screen - stopped meds when she found out she was pregnant -Ok to restart Lexapro and Buspar   6. [redacted] weeks gestation   7. Migraine - worse since becoming pregnant - discussed safe med - Rx  Reglan   Initial labs drawn. Early 1 hour GTT -yes A1C Started on ASA -yes Flu vax -yes Continue prenatal vitamins. Genetic Screening discussed, NIPS: requested. Ultrasound discussed; fetal anatomic survey: requested. Problem list reviewed and updated The nature of [place] for [facility] with multiple MDs and other Advanced Practice Providers was explained to patient; also emphasized that fellows, residents, and students are part of our team. Routine obstetric precautions reviewed Return in about 2 weeks (around 01/03/2023).   Julianne Handler, CNM 1:52 PM 12/20/22

## 2022-12-22 LAB — PROTEIN / CREATININE RATIO, URINE
Creatinine, Urine: 240.2 mg/dL
Protein, Ur: 37.3 mg/dL
Protein/Creat Ratio: 155 mg/g creat (ref 0–200)

## 2022-12-22 LAB — CULTURE, OB URINE

## 2022-12-22 LAB — URINE CULTURE, OB REFLEX

## 2022-12-24 LAB — PREGNANCY, INITIAL SCREEN
Antibody Screen: NEGATIVE
Basophils Absolute: 0 10*3/uL (ref 0.0–0.2)
Basos: 0 %
Bilirubin, UA: NEGATIVE
Chlamydia trachomatis, NAA: NEGATIVE
EOS (ABSOLUTE): 0 10*3/uL (ref 0.0–0.4)
Eos: 0 %
Glucose, UA: NEGATIVE
HCV Ab: NONREACTIVE
HIV Screen 4th Generation wRfx: NONREACTIVE
Hematocrit: 38.7 % (ref 34.0–46.6)
Hemoglobin: 13.1 g/dL (ref 11.1–15.9)
Hepatitis B Surface Ag: NEGATIVE
Immature Grans (Abs): 0 10*3/uL (ref 0.0–0.1)
Immature Granulocytes: 0 %
Ketones, UA: NEGATIVE
Leukocytes,UA: NEGATIVE
Lymphocytes Absolute: 1.8 10*3/uL (ref 0.7–3.1)
Lymphs: 21 %
MCH: 32 pg (ref 26.6–33.0)
MCHC: 33.9 g/dL (ref 31.5–35.7)
MCV: 94 fL (ref 79–97)
Monocytes Absolute: 0.5 10*3/uL (ref 0.1–0.9)
Monocytes: 6 %
Neisseria Gonorrhoeae by PCR: NEGATIVE
Neutrophils Absolute: 6.1 10*3/uL (ref 1.4–7.0)
Neutrophils: 73 %
Nitrite, UA: NEGATIVE
Platelets: 216 10*3/uL (ref 150–450)
Protein,UA: NEGATIVE
RBC: 4.1 x10E6/uL (ref 3.77–5.28)
RDW: 12.5 % (ref 11.7–15.4)
RPR Ser Ql: NONREACTIVE
Rh Factor: POSITIVE
Rubella Antibodies, IGG: 1.7 index (ref >0.99)
Specific Gravity, UA: 1.028 (ref 1.005–1.030)
Urobilinogen, Ur: 0.2 mg/dL (ref 0.2–1.0)
WBC: 8.4 10*3/uL (ref 3.4–10.8)
pH, UA: 5.5 (ref 5.0–7.5)

## 2022-12-24 LAB — COMPREHENSIVE METABOLIC PANEL
ALT: 9 IU/L (ref 0–32)
AST: 9 IU/L (ref 0–40)
Albumin/Globulin Ratio: 1.8 (ref 1.2–2.2)
Albumin: 4.3 g/dL (ref 4.0–5.0)
Alkaline Phosphatase: 57 IU/L (ref 44–121)
BUN/Creatinine Ratio: 15 (ref 9–23)
BUN: 8 mg/dL (ref 6–20)
Bilirubin Total: <0.2 mg/dL (ref 0.0–1.2)
CO2: 21 mmol/L (ref 20–29)
Calcium: 10.2 mg/dL (ref 8.7–10.2)
Chloride: 101 mmol/L (ref 96–106)
Creatinine, Ser: 0.55 mg/dL — ABNORMAL LOW (ref 0.57–1.00)
Globulin, Total: 2.4 g/dL (ref 1.5–4.5)
Glucose: 91 mg/dL (ref 70–99)
Potassium: 4.3 mmol/L (ref 3.5–5.2)
Sodium: 136 mmol/L (ref 134–144)
Total Protein: 6.7 g/dL (ref 6.0–8.5)
eGFR: 127 mL/min/{1.73_m2} (ref >59)

## 2022-12-24 LAB — HCV INTERPRETATION

## 2022-12-24 LAB — HEMOGLOBIN A1C
Est. average glucose Bld gHb Est-mCnc: 103 mg/dL
Hgb A1c MFr Bld: 5.2 % (ref 4.8–5.6)

## 2022-12-24 LAB — MICROSCOPIC EXAMINATION
Bacteria, UA: NONE SEEN
Casts: NONE SEEN /lpf

## 2022-12-24 LAB — URINE CULTURE, OB REFLEX

## 2022-12-24 LAB — HEPATITIS C ANTIBODY: Hep C Virus Ab: NONREACTIVE

## 2022-12-25 LAB — CYTOLOGY - PAP
Chlamydia: NEGATIVE
Comment: NEGATIVE
Comment: NORMAL
Diagnosis: NEGATIVE
Neisseria Gonorrhea: NEGATIVE

## 2022-12-30 NOTE — Progress Notes (Unsigned)
   PRENATAL VISIT NOTE  Subjective:  Tonya Hart is a 32 y.o. G2P1001 at [redacted]w[redacted]d being seen today for ongoing prenatal care.  She is currently monitored for the following issues for this high-risk pregnancy and has Cigarette nicotine dependence without complication; Recurrent anterior dislocation of shoulder, left; History of pregnancy induced hypertension; Fibromyalgia; Anxiety; and Supervision of other normal pregnancy, antepartum on their problem list.  Patient reports no complaints.  Contractions: Not present. Vag. Bleeding: None.  Movement: Absent. Denies leaking of fluid.   The following portions of the patient's history were reviewed and updated as appropriate: allergies, current medications, past family history, past medical history, past social history, past surgical history and problem list.   Objective:   Vitals:   01/03/23 0954  BP: 110/72  Pulse: 91  Weight: 193 lb (87.5 kg)    Fetal Status: Fetal Heart Rate (bpm): 156   Movement: Absent     General:  Alert, oriented and cooperative. Patient is in no acute distress.  Skin: Skin is warm and dry. No rash noted.   Cardiovascular: Normal heart rate noted  Respiratory: Normal respiratory effort, no problems with respiration noted  Abdomen: Soft, gravid, appropriate for gestational age.  Pain/Pressure: Absent     Pelvic: Cervical exam deferred        Extremities: Normal range of motion.  Edema: None  Mental Status: Normal mood and affect. Normal behavior. Normal judgment and thought content.   Assessment and Plan:  Pregnancy: G2P1001 at [redacted]w[redacted]d 1. Supervision of other normal pregnancy, antepartum Discussed tobacco use - she quit in 2019. She does still vape.  Discussed plans for genetics - she plans to have blood work drawn tomorrow.  MSAFP next time  Anatomy scheduled for 3/25.  Resume baby scripts schedule.   2. History of pregnancy induced hypertension Continue BASA Baseline CMP wnl, P/C ratio 155 Reviewed measures  to modify risk I.e. maternal weight gain (doing well so far), if GDM then having good control.   Preterm labor symptoms and general obstetric precautions including but not limited to vaginal bleeding, contractions, leaking of fluid and fetal movement were reviewed in detail with the patient. Please refer to After Visit Summary for other counseling recommendations.   Return in about 7 weeks (around 02/21/2023).  Future Appointments  Date Time Provider [place]  01/26/2023  1:00 PM Charlett Blake, MD [facility]  02/12/2023  1:30 PM [facility] US3 [facility]  02/15/2023  1:30 PM Inez Catalina, MD [facility]    Radene Gunning, MD

## 2023-01-03 ENCOUNTER — Encounter: Payer: Self-pay | Admitting: Obstetrics and Gynecology

## 2023-01-03 ENCOUNTER — Ambulatory Visit (INDEPENDENT_AMBULATORY_CARE_PROVIDER_SITE_OTHER): Payer: Medicaid Other | Admitting: Obstetrics and Gynecology

## 2023-01-03 VITALS — BP 110/72 | HR 91 | Wt 193.0 lb

## 2023-01-03 DIAGNOSIS — Z3482 Encounter for supervision of other normal pregnancy, second trimester: Secondary | ICD-10-CM

## 2023-01-03 DIAGNOSIS — Z3A13 13 weeks gestation of pregnancy: Secondary | ICD-10-CM

## 2023-01-03 DIAGNOSIS — Z3480 Encounter for supervision of other normal pregnancy, unspecified trimester: Secondary | ICD-10-CM

## 2023-01-03 DIAGNOSIS — Z8759 Personal history of other complications of pregnancy, childbirth and the puerperium: Secondary | ICD-10-CM

## 2023-01-04 DIAGNOSIS — Z31438 Encounter for other genetic testing of female for procreative management: Secondary | ICD-10-CM | POA: Diagnosis not present

## 2023-01-04 DIAGNOSIS — F17219 Nicotine dependence, cigarettes, with unspecified nicotine-induced disorders: Secondary | ICD-10-CM | POA: Diagnosis not present

## 2023-01-04 DIAGNOSIS — Z3A13 13 weeks gestation of pregnancy: Secondary | ICD-10-CM | POA: Diagnosis not present

## 2023-01-04 DIAGNOSIS — O99331 Smoking (tobacco) complicating pregnancy, first trimester: Secondary | ICD-10-CM | POA: Diagnosis not present

## 2023-01-13 LAB — PANORAMA PRENATAL TEST FULL PANEL:PANORAMA TEST PLUS 5 ADDITIONAL MICRODELETIONS: FETAL FRACTION: 8.3

## 2023-01-26 ENCOUNTER — Encounter: Payer: Medicaid Other | Attending: Physical Medicine & Rehabilitation | Admitting: Physical Medicine & Rehabilitation

## 2023-01-26 ENCOUNTER — Encounter: Payer: Self-pay | Admitting: Physical Medicine & Rehabilitation

## 2023-01-26 VITALS — BP 102/69 | HR 73 | Ht 69.0 in | Wt 192.0 lb

## 2023-01-26 DIAGNOSIS — M797 Fibromyalgia: Secondary | ICD-10-CM

## 2023-01-26 NOTE — Progress Notes (Signed)
Subjective:    Patient ID: Tonya Hart, female    DOB: [birth year], 32 y.o.   MRN: [id number]  HPI 32 year old female with fibromyalgia syndrome returns today with news that she is pregnant.  She is due in August.  She has been seen by OB/GYN knee and recommended to come off of Lyrica.  She continues on the following medications: Lexapro Buspirone  Pre natal vitamin Baby ASA  Promethazine  The patient is experiencing increased pain levels.  Hyperemesis is causing increased pain levels.  We discussed that medication management needs to be confined to medications approved by her OB/GYN   We discussed natural methods of treating pain.  She has the list of foods that are helpful for patient's symptoms pain or patient's who have inflammation.  Also foods that are helpful for anxiety. We discussed exercise and she is already walking 1/2 mile per day.  When she tries to go up to 1 mile she has increased pain. She is also doing yoga 1/2 hour/day.  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   Fortunately her mother is available to with childcare  We talked about topical medications, pt using ICY Hot for temporary relief. Pain Inventory Average Pain 8 Pain Right Now 7 My pain is constant, sharp, burning, stabbing, tingling, and aching  In the last 24 hours, has pain interfered with the following? General activity 7 Relation with others 7 Enjoyment of life 7 What TIME of day is your pain at its worst? morning , evening, and night Sleep (in general) Poor  Pain is worse with: walking, bending, sitting, standing, and some activites Pain improves with:  nothing Relief from Meds: 0  Family History  Problem Relation Age of Onset   Thyroid disease Mother    Hyperlipidemia Mother    Hypertension Mother    Cataracts Mother    Hyperlipidemia Father    COPD Father    Heart attack Father    Hypertension Father    Diabetes Father    Prostate  cancer Maternal Grandfather    Social History   Socioeconomic History   Marital status: Single    Spouse name: Not on file   Number of children: 1   Years of education: Not on file   Highest education level: Not on file  Occupational History   Occupation: ISS    Employer: [employer]  Tobacco Use   Smoking status: Former    Types: Cigarettes    Quit date: 2019    Years since quitting: 5.1   Smokeless tobacco: Never  Vaping Use   Vaping Use: Every day   Last attempt to quit: 08/04/2020   Substances: Nicotine  Substance and Sexual Activity   Alcohol use: Not Currently    Comment: 3 drinks/week, beer or liquor   Drug use: Yes    Frequency: 7.0 times per week    Types: Marijuana   Sexual activity: Yes    Partners: Male    Birth control/protection: None  Other Topics Concern   Not on file  Social History Narrative   Right Handed    Lives in a one story home    Social Determinants of Health   Financial Resource Strain: Not on file  Food Insecurity: Not on file  Transportation Needs: Not on file  Physical Activity: Not on file  Stress: Not on file  Social Connections: Not on file   Past Surgical History:  Procedure Laterality  Date   SHOULDER ARTHROSCOPY WITH LABRAL REPAIR Left 08/11/2021   Procedure: LEFT SHOULDER ARTHROSCOPY WITH LABRAL REPAIR;  Surgeon: Meredith Pel, MD;  Location: [place];  Service: Orthopedics;  Laterality: Left;   TONSILLECTOMY     TONSILLECTOMY AND ADENOIDECTOMY     Past Surgical History:  Procedure Laterality Date   SHOULDER ARTHROSCOPY WITH LABRAL REPAIR Left 08/11/2021   Procedure: LEFT SHOULDER ARTHROSCOPY WITH LABRAL REPAIR;  Surgeon: Meredith Pel, MD;  Location: [place];  Service: Orthopedics;  Laterality: Left;   TONSILLECTOMY     TONSILLECTOMY AND ADENOIDECTOMY     Past Medical History:  Diagnosis Date   Anxiety and depression    no current medications   Asthma    Endometriosis    Environmental and seasonal  allergies    Gestational hypertension    PCOS (polycystic ovarian syndrome)    Seasonal allergies    Ht '5\' 9"'$  (1.753 m)   Wt 192 lb (87.1 kg)   LMP 10/09/2022   BMI 28.35 kg/m   Opioid Risk Score:   Fall Risk Score:  `1  Depression screen PHQ 2/9     12/20/2022    1:23 PM 09/15/2022    2:01 PM 08/03/2022   12:38 PM 07/04/2022   11:16 AM 07/04/2022   10:04 AM 06/28/2022   11:43 AM 06/28/2022   11:31 AM  Depression screen PHQ 2/9  Decreased Interest 1 0 1   1   Down, Depressed, Hopeless 1 0 1   1   PHQ - 2 Score 2 0 2   2   Altered sleeping '2   2  2   '$ Tired, decreased energy '2   1  1   '$ Change in appetite 0   0 0 1 1  Feeling bad or failure about yourself  '1   1  1   '$ Trouble concentrating '1   1 1 2 2  '$ Moving slowly or fidgety/restless 0   0  0   Suicidal thoughts 0   0  0   PHQ-9 Score 8     9   Difficult doing work/chores    Somewhat difficult  Somewhat difficult     Review of Systems  Musculoskeletal:  Positive for arthralgias, back pain and gait problem.       Pain in the shoulders, hips, legs arms  All other systems reviewed and are negative.      Objective:   Physical Exam Vitals and nursing note reviewed.  Constitutional:      Appearance: She is normal weight.  HENT:     Head: Normocephalic and atraumatic.  Eyes:     Extraocular Movements: Extraocular movements intact.     Conjunctiva/sclera: Conjunctivae normal.     Pupils: Pupils are equal, round, and reactive to light.  Musculoskeletal:     Cervical back: Normal range of motion.     Comments: Tenderness with very light palpation across the focal paraspinal parascapular area thoracic area and lumbar area as well as bilateral hip and buttock region. Instability.  Skin:    General: Skin is warm and dry.  Neurological:     General: No focal deficit present.     Mental Status: She is alert and oriented to person, place, and time.  Psychiatric:        Mood and Affect: Mood normal.        Behavior: Behavior  normal.        Thought Content: Thought content normal.  Judgment: Judgment normal.           Assessment & Plan:   #1.  Fibromyalgia syndrome worsening pain symptoms off of pregabalin.  In addition hyperemesis is contributing to increased pain.  We discussed the importance of exercise including aerobic exercise and her yoga combining stretching as well as relaxation.  She also does some intermittent reading during the day. We discussed use of topical medications. She has modified her diet and in fact is losing some weight at this point. Patient has a due date in August.  She is planning natural delivery although a C-section may be possible if she develops preeclampsia once again.  She plans to breast-feed.  She breast-fed about 18 months with her last child.  We discussed that pregabalin can be restarted when she is done breast-feeding.  She will call to make appointment at that time.

## 2023-02-07 ENCOUNTER — Other Ambulatory Visit: Payer: Self-pay

## 2023-02-07 DIAGNOSIS — Z3480 Encounter for supervision of other normal pregnancy, unspecified trimester: Secondary | ICD-10-CM

## 2023-02-12 ENCOUNTER — Ambulatory Visit: Payer: Medicaid Other | Admitting: *Deleted

## 2023-02-12 ENCOUNTER — Encounter: Payer: Self-pay | Admitting: *Deleted

## 2023-02-12 ENCOUNTER — Ambulatory Visit: Payer: Medicaid Other | Attending: Certified Nurse Midwife

## 2023-02-12 VITALS — BP 126/69 | HR 101

## 2023-02-12 DIAGNOSIS — F419 Anxiety disorder, unspecified: Secondary | ICD-10-CM

## 2023-02-12 DIAGNOSIS — O99891 Other specified diseases and conditions complicating pregnancy: Secondary | ICD-10-CM | POA: Diagnosis not present

## 2023-02-12 DIAGNOSIS — Z3A19 19 weeks gestation of pregnancy: Secondary | ICD-10-CM | POA: Diagnosis not present

## 2023-02-12 DIAGNOSIS — O1492 Unspecified pre-eclampsia, second trimester: Secondary | ICD-10-CM | POA: Diagnosis not present

## 2023-02-12 DIAGNOSIS — Z3480 Encounter for supervision of other normal pregnancy, unspecified trimester: Secondary | ICD-10-CM | POA: Insufficient documentation

## 2023-02-12 DIAGNOSIS — M797 Fibromyalgia: Secondary | ICD-10-CM | POA: Diagnosis not present

## 2023-02-12 DIAGNOSIS — Z8759 Personal history of other complications of pregnancy, childbirth and the puerperium: Secondary | ICD-10-CM | POA: Diagnosis not present

## 2023-02-12 DIAGNOSIS — O99332 Smoking (tobacco) complicating pregnancy, second trimester: Secondary | ICD-10-CM

## 2023-02-12 DIAGNOSIS — O99342 Other mental disorders complicating pregnancy, second trimester: Secondary | ICD-10-CM | POA: Insufficient documentation

## 2023-02-12 DIAGNOSIS — O321XX0 Maternal care for breech presentation, not applicable or unspecified: Secondary | ICD-10-CM | POA: Diagnosis not present

## 2023-02-12 DIAGNOSIS — O09292 Supervision of pregnancy with other poor reproductive or obstetric history, second trimester: Secondary | ICD-10-CM | POA: Diagnosis not present

## 2023-02-12 DIAGNOSIS — Z363 Encounter for antenatal screening for malformations: Secondary | ICD-10-CM | POA: Insufficient documentation

## 2023-02-15 ENCOUNTER — Ambulatory Visit (INDEPENDENT_AMBULATORY_CARE_PROVIDER_SITE_OTHER): Payer: Medicaid Other | Admitting: Obstetrics and Gynecology

## 2023-02-15 VITALS — BP 117/68 | HR 98 | Wt 193.0 lb

## 2023-02-15 DIAGNOSIS — Z3A19 19 weeks gestation of pregnancy: Secondary | ICD-10-CM

## 2023-02-15 DIAGNOSIS — Z8759 Personal history of other complications of pregnancy, childbirth and the puerperium: Secondary | ICD-10-CM

## 2023-02-15 DIAGNOSIS — F419 Anxiety disorder, unspecified: Secondary | ICD-10-CM

## 2023-02-15 DIAGNOSIS — Z3482 Encounter for supervision of other normal pregnancy, second trimester: Secondary | ICD-10-CM | POA: Diagnosis not present

## 2023-02-15 DIAGNOSIS — M797 Fibromyalgia: Secondary | ICD-10-CM

## 2023-02-15 DIAGNOSIS — F17210 Nicotine dependence, cigarettes, uncomplicated: Secondary | ICD-10-CM

## 2023-02-15 DIAGNOSIS — Z3480 Encounter for supervision of other normal pregnancy, unspecified trimester: Secondary | ICD-10-CM

## 2023-02-15 NOTE — Progress Notes (Signed)
   PRENATAL VISIT NOTE  Subjective:  Tonya Hart is a 32 y.o. G2P1001 at [redacted]w[redacted]d being seen today for ongoing prenatal care.  She is currently monitored for the following issues for this low-risk pregnancy and has Cigarette nicotine dependence without complication; Recurrent anterior dislocation of shoulder, left; History of pregnancy induced hypertension; Fibromyalgia; Anxiety; and Supervision of other normal pregnancy, antepartum on their problem list.  Patient reports  increasing back and abdominal pain . Has hx fibromyalgia/myofascial pain syndrome and was on pregabalin prior to pregnancy but her pain specialist does not feel comfortable treating her while pregnant or breastfeeding. Requests paperwork for temporary disability placard since she is unable to ambulate long distances without pain and needed to stop and rest. Contractions: Not present. Vag. Bleeding: None.  Movement: Present. Denies leaking of fluid.   The following portions of the patient's history were reviewed and updated as appropriate: allergies, current medications, past family history, past medical history, past social history, past surgical history and problem list.   Objective:   Vitals:   02/15/23 1304  BP: 117/68  Pulse: 98  Weight: 193 lb (87.5 kg)   Fetal Status: Fetal Heart Rate (bpm): 148   Movement: Present     General:  Alert, oriented and cooperative. Patient is in no acute distress.  Skin: Skin is warm and dry. No rash noted.   Cardiovascular: Normal heart rate noted  Respiratory: Normal respiratory effort, no problems with respiration noted  Abdomen: Soft, gravid, appropriate for gestational age.  Pain/Pressure: Absent      Assessment and Plan:  Pregnancy: G2P1001 at [redacted]w[redacted]d 1. Supervision of other normal pregnancy, antepartum 2. [redacted] weeks gestation of pregnancy Discussed option for AFP. Pt does not have time to get lab today but will come back next week - AFP, Serum, Open Spina Bifida; Future  3.  Fibromyalgia Discussed option for PT as needed  4. Anxiety Continue lexapro and buspar  5. History of pregnancy induced hypertension Normal baseline labs. Normotensive  6. Cigarette nicotine dependence without complication  Please refer to After Visit Summary for other counseling recommendations.   Return in about 1 month (around 03/19/2023) for return OB at 24 weeks.  Future Appointments  Date Time Provider [place]  03/20/2023  1:10 PM Julianne Handler, CNM [facility]  04/17/2023  8:10 AM Renee Harder, CNM [facility]    Inez Catalina, MD

## 2023-02-19 ENCOUNTER — Other Ambulatory Visit: Payer: Self-pay | Admitting: Obstetrics and Gynecology

## 2023-02-19 DIAGNOSIS — Z3480 Encounter for supervision of other normal pregnancy, unspecified trimester: Secondary | ICD-10-CM | POA: Diagnosis not present

## 2023-02-22 LAB — AFP, SERUM, OPEN SPINA BIFIDA
AFP MoM: 1.01
AFP Value: 51.9 ng/mL
Gest. Age on Collection Date: 20.3 weeks
Maternal Age At EDD: 30.2 yr
OSBR Risk 1 IN: 10000
Test Results:: NEGATIVE
Weight: 196 [lb_av]

## 2023-03-20 ENCOUNTER — Ambulatory Visit (INDEPENDENT_AMBULATORY_CARE_PROVIDER_SITE_OTHER): Payer: Medicaid Other | Admitting: Certified Nurse Midwife

## 2023-03-20 VITALS — BP 122/75 | HR 111 | Wt 205.0 lb

## 2023-03-20 DIAGNOSIS — Z3A24 24 weeks gestation of pregnancy: Secondary | ICD-10-CM

## 2023-03-20 DIAGNOSIS — Z3480 Encounter for supervision of other normal pregnancy, unspecified trimester: Secondary | ICD-10-CM

## 2023-03-20 NOTE — Progress Notes (Signed)
Subjective:  Tonya Hart is a 32 y.o. G2P1001 at [redacted]w[redacted]d being seen today for ongoing prenatal care.  She is currently monitored for the following issues for this low-risk pregnancy and has Cigarette nicotine dependence without complication; Recurrent anterior dislocation of shoulder, left; History of pregnancy induced hypertension; Fibromyalgia; Anxiety; and Supervision of other normal pregnancy, antepartum on their problem list.  Patient reports no complaints.  Contractions: Not present. Vag. Bleeding: None.  Movement: Present. Denies leaking of fluid.   The following portions of the patient's history were reviewed and updated as appropriate: allergies, current medications, past family history, past medical history, past social history, past surgical history and problem list. Problem list updated.  Objective:   Vitals:   03/20/23 1300  BP: 122/75  Pulse: (!) 111  Weight: 205 lb (93 kg)    Fetal Status: Fetal Heart Rate (bpm): 150 Fundal Height: 24 cm Movement: Present     General:  Alert, oriented and cooperative. Patient is in no acute distress.  Skin: Skin is warm and dry. No rash noted.   Cardiovascular: Normal heart rate noted  Respiratory: Normal respiratory effort, no problems with respiration noted  Abdomen: Soft, gravid, appropriate for gestational age. Pain/Pressure: Absent     Pelvic: Vag. Bleeding: None Vag D/C Character: Thin   Cervical exam deferred        Extremities: Normal range of motion.  Edema: None  Mental Status: Normal mood and affect. Normal behavior. Normal judgment and thought content.   Urinalysis:      Assessment and Plan:  Pregnancy: G2P1001 at [redacted]w[redacted]d  1. [redacted] weeks gestation of pregnancy  2. Supervision of other normal pregnancy, antepartum - Labs and GTT next visit.  Preterm labor symptoms and general obstetric precautions including but not limited to vaginal bleeding, contractions, leaking of fluid and fetal movement were reviewed in detail with the  patient. Please refer to After Visit Summary for other counseling recommendations.  Return in about 4 weeks (around 04/17/2023).   Donette Larry, CNM

## 2023-04-09 ENCOUNTER — Ambulatory Visit (INDEPENDENT_AMBULATORY_CARE_PROVIDER_SITE_OTHER): Payer: Medicaid Other | Admitting: Obstetrics & Gynecology

## 2023-04-09 VITALS — BP 116/72 | HR 82 | Wt 205.0 lb

## 2023-04-09 DIAGNOSIS — Z23 Encounter for immunization: Secondary | ICD-10-CM

## 2023-04-09 DIAGNOSIS — Z8759 Personal history of other complications of pregnancy, childbirth and the puerperium: Secondary | ICD-10-CM

## 2023-04-09 DIAGNOSIS — F17210 Nicotine dependence, cigarettes, uncomplicated: Secondary | ICD-10-CM | POA: Insufficient documentation

## 2023-04-09 DIAGNOSIS — F419 Anxiety disorder, unspecified: Secondary | ICD-10-CM

## 2023-04-09 DIAGNOSIS — Z3480 Encounter for supervision of other normal pregnancy, unspecified trimester: Secondary | ICD-10-CM

## 2023-04-09 NOTE — Progress Notes (Signed)
   PRENATAL VISIT NOTE  Subjective:  Tonya Hart is a 32 y.o. G2P1001 at [redacted]w[redacted]d being seen today for ongoing prenatal care.  She is currently monitored for the following issues for this low-risk pregnancy and has Cigarette nicotine dependence without complication; Recurrent anterior dislocation of shoulder, left; History of pregnancy induced hypertension; Fibromyalgia; Anxiety; and Supervision of other normal pregnancy, antepartum on their problem list.  Patient reports  pain from fibromyalgia--stretches and exercise .  Contractions: Not present. Vag. Bleeding: None.  Movement: Present. Denies leaking of fluid.   The following portions of the patient's history were reviewed and updated as appropriate: allergies, current medications, past family history, past medical history, past social history, past surgical history and problem list.   Objective:   Vitals:   04/09/23 0808  BP: 116/72  Pulse: 82  Weight: 205 lb (93 kg)    Fetal Status: Fetal Heart Rate (bpm): 148   Movement: Present     General:  Alert, oriented and cooperative. Patient is in no acute distress.  Skin: Skin is warm and dry. No rash noted.   Cardiovascular: Normal heart rate noted  Respiratory: Normal respiratory effort, no problems with respiration noted  Abdomen: Soft, gravid, appropriate for gestational age.  Pain/Pressure: Absent     Pelvic: Cervical exam deferred        Extremities: Normal range of motion.  Edema: Trace  Mental Status: Normal mood and affect. Normal behavior. Normal judgment and thought content.   Assessment and Plan:  Pregnancy: G2P1001 at [redacted]w[redacted]d 1. Supervision of other normal pregnancy, antepartum - Glucose Tolerance, 2 Hours w/1 Hour - HIV antibody (with reflex) - CBC - RPR Pt taking BP at home but not putting in APP.  Promises to start!  2. History of pregnancy induced hypertension BP normal; continue to monitor  3.   Anxiety Continue Lexapro and Buspar  4.  Pubic symphysis  separation Pregnancy belt and PT  Preterm labor symptoms and general obstetric precautions including but not limited to vaginal bleeding, contractions, leaking of fluid and fetal movement were reviewed in detail with the patient. Please refer to After Visit Summary for other counseling recommendations.   No follow-ups on file.  Future Appointments  Date Time Provider Department Center  04/09/2023 11:10 AM Lesly Dukes, MD [facility]    Elsie Lincoln, MD

## 2023-04-09 NOTE — Addendum Note (Signed)
Addended by: Kathie Dike on: 04/09/2023 09:08 AM   Modules accepted: Orders

## 2023-04-10 LAB — CBC
Hematocrit: 36.4 % (ref 34.0–46.6)
Hemoglobin: 12.1 g/dL (ref 11.1–15.9)
MCH: 32.4 pg (ref 26.6–33.0)
MCHC: 33.2 g/dL (ref 31.5–35.7)
MCV: 98 fL — ABNORMAL HIGH (ref 79–97)
Platelets: 221 10*3/uL (ref 150–450)
RBC: 3.73 x10E6/uL — ABNORMAL LOW (ref 3.77–5.28)
RDW: 11.9 % (ref 11.7–15.4)
WBC: 8.2 10*3/uL (ref 3.4–10.8)

## 2023-04-10 LAB — RPR: RPR Ser Ql: NONREACTIVE

## 2023-04-10 LAB — GLUCOSE TOLERANCE, 2 HOURS W/ 1HR
Glucose, 1 hour: 142 mg/dL (ref 70–179)
Glucose, 2 hour: 77 mg/dL (ref 70–152)
Glucose, Fasting: 87 mg/dL (ref 70–91)

## 2023-04-10 LAB — HIV ANTIBODY (ROUTINE TESTING W REFLEX): HIV Screen 4th Generation wRfx: NONREACTIVE

## 2023-05-09 ENCOUNTER — Encounter: Payer: Self-pay | Admitting: Obstetrics and Gynecology

## 2023-05-09 ENCOUNTER — Ambulatory Visit (INDEPENDENT_AMBULATORY_CARE_PROVIDER_SITE_OTHER): Payer: Medicaid Other | Admitting: Obstetrics and Gynecology

## 2023-05-09 VITALS — BP 111/76 | HR 96 | Wt 216.0 lb

## 2023-05-09 DIAGNOSIS — Z8759 Personal history of other complications of pregnancy, childbirth and the puerperium: Secondary | ICD-10-CM

## 2023-05-09 DIAGNOSIS — Z3480 Encounter for supervision of other normal pregnancy, unspecified trimester: Secondary | ICD-10-CM

## 2023-05-09 DIAGNOSIS — F419 Anxiety disorder, unspecified: Secondary | ICD-10-CM

## 2023-05-09 DIAGNOSIS — Z3A31 31 weeks gestation of pregnancy: Secondary | ICD-10-CM

## 2023-05-09 NOTE — Progress Notes (Signed)
   PRENATAL VISIT NOTE  Subjective:  Tonya Hart is a 32 y.o. G2P1001 at [redacted]w[redacted]d being seen today for ongoing prenatal care.  She is currently monitored for the following issues for this low-risk pregnancy and has Recurrent anterior dislocation of shoulder, left; History of pregnancy induced hypertension; Fibromyalgia; Anxiety; Supervision of other normal pregnancy, antepartum; and Cigarette nicotine dependence without complication on their problem list.  Patient reports no complaints.  Contractions: Irritability. Vag. Bleeding: None.  Movement: Present. Denies leaking of fluid.   The following portions of the patient's history were reviewed and updated as appropriate: allergies, current medications, past family history, past medical history, past social history, past surgical history and problem list.   Objective:   Vitals:   05/09/23 0913  BP: 111/76  Pulse: 96  Weight: 216 lb (98 kg)    Fetal Status: Fetal Heart Rate (bpm): 143 Fundal Height: 30 cm Movement: Present     General:  Alert, oriented and cooperative. Patient is in no acute distress.  Skin: Skin is warm and dry. No rash noted.   Cardiovascular: Normal heart rate noted  Respiratory: Normal respiratory effort, no problems with respiration noted  Abdomen: Soft, gravid, appropriate for gestational age.  Pain/Pressure: Present     Pelvic: Cervical exam deferred        Extremities: Normal range of motion.  Edema: None  Mental Status: Normal mood and affect. Normal behavior. Normal judgment and thought content.   Assessment and Plan:  Pregnancy: G2P1001 at [redacted]w[redacted]d  1. Supervision of other normal pregnancy, antepartum BP and FHR normal Feeling vigorous movement FH appropriate   2. History of pregnancy induced hypertension Normotensive today Continue ASA  Taking bp at home, reports normal no elevated   3. Anxiety Continue Lexapro and Buspar   4. [redacted] weeks gestation of pregnancy Routine prenatal care   5. Pubic  symphysis separation Continue exercises/stretches, belt Discussed vaginal delivery   Preterm labor symptoms and general obstetric precautions including but not limited to vaginal bleeding, contractions, leaking of fluid and fetal movement were reviewed in detail with the patient. Please refer to After Visit Summary for other counseling recommendations.   Return in two weeks for routine prenatal care   Future Appointments  Date Time Provider Department Center  05/29/2023 11:10 AM Corlis Hove, NP [facility]      Albertine Grates, FNP

## 2023-05-29 ENCOUNTER — Telehealth (INDEPENDENT_AMBULATORY_CARE_PROVIDER_SITE_OTHER): Payer: Medicaid Other | Admitting: Student

## 2023-05-29 VITALS — BP 120/78 | HR 84

## 2023-05-29 DIAGNOSIS — Z8759 Personal history of other complications of pregnancy, childbirth and the puerperium: Secondary | ICD-10-CM

## 2023-05-29 DIAGNOSIS — O99353 Diseases of the nervous system complicating pregnancy, third trimester: Secondary | ICD-10-CM

## 2023-05-29 DIAGNOSIS — O99343 Other mental disorders complicating pregnancy, third trimester: Secondary | ICD-10-CM

## 2023-05-29 DIAGNOSIS — R1011 Right upper quadrant pain: Secondary | ICD-10-CM

## 2023-05-29 DIAGNOSIS — G43901 Migraine, unspecified, not intractable, with status migrainosus: Secondary | ICD-10-CM

## 2023-05-29 DIAGNOSIS — O09293 Supervision of pregnancy with other poor reproductive or obstetric history, third trimester: Secondary | ICD-10-CM

## 2023-05-29 DIAGNOSIS — F419 Anxiety disorder, unspecified: Secondary | ICD-10-CM

## 2023-05-29 DIAGNOSIS — O26893 Other specified pregnancy related conditions, third trimester: Secondary | ICD-10-CM

## 2023-05-29 DIAGNOSIS — Z3480 Encounter for supervision of other normal pregnancy, unspecified trimester: Secondary | ICD-10-CM

## 2023-05-29 DIAGNOSIS — Z3A34 34 weeks gestation of pregnancy: Secondary | ICD-10-CM

## 2023-05-29 MED ORDER — METOCLOPRAMIDE HCL 10 MG PO TABS: 10.0000 mg | ORAL_TABLET | Freq: Four times a day (QID) | ORAL | 0 refills | Status: DC | PRN

## 2023-05-29 NOTE — Progress Notes (Signed)
OBSTETRICS PRENATAL VIRTUAL VISIT ENCOUNTER NOTE  Provider location: Center for [facility]   Patient location: Home  I connected with Loree Fee on 05/29/23 at  8:10 AM EDT by MyChart Video Encounter and verified that I am speaking with the correct person using two identifiers. I discussed the limitations, risks, security and privacy concerns of performing an evaluation and management service virtually and the availability of in person appointments. I also discussed with the patient that there may be a patient responsible charge related to this service. The patient expressed understanding and agreed to proceed. Subjective:  Tonya Hart is a 32 y.o. G2P1001 at [redacted]w[redacted]d being seen today for ongoing prenatal care.  She is currently monitored for the following issues for this low-risk pregnancy and has Recurrent anterior dislocation of shoulder, left; History of pregnancy induced hypertension; Fibromyalgia; Anxiety; Supervision of other normal pregnancy, antepartum; and Cigarette nicotine dependence without complication on their problem list.  Patient reports  that she has been experiencing increased nausea and vomiting for 2 weeks . She has also noticed throbbing pain in her right upper rib. She states there is some skin discoloration present and feels worse in the morning. Patient unsure if this is just pressure from fetal position or worsening pain from identified pubic separation.    .  .   . Denies any leaking of fluid.   The following portions of the patient's history were reviewed and updated as appropriate: allergies, current medications, past family history, past medical history, past social history, past surgical history and problem list.   Objective:   Vitals:   05/29/23 0810  BP: 120/78  Pulse: 84    Fetal Status:           General:  Alert, oriented and cooperative. Patient is in no acute distress.  Respiratory: Normal respiratory effort, no problems  with respiration noted  Mental Status: Normal mood and affect. Normal behavior. Normal judgment and thought content.  Rest of physical exam deferred due to type of encounter  Imaging: No results found.  Assessment and Plan:  Pregnancy: G2P1001 at [redacted]w[redacted]d 1. Supervision of other normal pregnancy, antepartum - Frequent and vigorous fetal movement   2. [redacted] weeks gestation of pregnancy - swabs at next visit  3. History of pregnancy induced hypertension - Blood pressures stable at home and prior office visits - ASA therapy   4. Anxiety - continue lexapro and Buspar   5. Migraine with status migrainosus, not intractable, unspecified migraine type - stable - metoCLOPramide (REGLAN) 10 MG tablet; Take 1 tablet (10 mg total) by mouth every 6 (six) hours as needed for nausea (headache).  Dispense: 30 tablet; Refill: 0  6. Right upper quadrant abdominal pain during pregnancy in third trimester - Discussed reason for recommending lab work-up to ensure no organ involvement in symptoms. Precautions provided for needing to report to more immediate medical attention such as lightheadedness, dizziness, inability to tolerate PO intake/worsening nausea and vomiting, fever, difficulty breathing or pain that becomes intolerable. Patient will report to clinic tomorrow AM for lab draw. - Bile acids, total; Future - CBC; Future - Comprehensive metabolic panel; Future   Preterm labor symptoms and general obstetric precautions including but not limited to vaginal bleeding, contractions, leaking of fluid and fetal movement were reviewed in detail with the patient. I discussed the assessment and treatment plan with the patient. The patient was provided an opportunity to ask questions and all were answered. The patient agreed with the plan and  demonstrated an understanding of the instructions. The patient was advised to call back or seek an in-person office evaluation/go to [facility] at [facility] for  any urgent or concerning symptoms. Please refer to After Visit Summary for other counseling recommendations.   I provided 20 minutes of face-to-face time during this encounter.  Return in about 2 weeks (around 06/12/2023) for LOB/GBS, IN-PERSON; LAB ONLY VISIT TOMORROW.  Future Appointments  Date Time Provider Department Center  06/14/2023  8:10 AM Lennart Pall, MD [facility]    Corlis Hove, NP Center for [employer]

## 2023-05-30 ENCOUNTER — Ambulatory Visit (INDEPENDENT_AMBULATORY_CARE_PROVIDER_SITE_OTHER): Payer: Medicaid Other

## 2023-05-30 ENCOUNTER — Other Ambulatory Visit: Payer: Self-pay | Admitting: *Deleted

## 2023-05-30 DIAGNOSIS — Z3480 Encounter for supervision of other normal pregnancy, unspecified trimester: Secondary | ICD-10-CM | POA: Diagnosis not present

## 2023-05-30 DIAGNOSIS — R1011 Right upper quadrant pain: Secondary | ICD-10-CM | POA: Diagnosis not present

## 2023-05-30 DIAGNOSIS — O26893 Other specified pregnancy related conditions, third trimester: Secondary | ICD-10-CM

## 2023-05-30 DIAGNOSIS — Z3A34 34 weeks gestation of pregnancy: Secondary | ICD-10-CM | POA: Diagnosis not present

## 2023-05-30 DIAGNOSIS — Z09 Encounter for follow-up examination after completed treatment for conditions other than malignant neoplasm: Secondary | ICD-10-CM

## 2023-05-30 NOTE — Progress Notes (Signed)
Pt was given lab orders and went to lab for blood draw.

## 2023-05-31 LAB — COMPREHENSIVE METABOLIC PANEL
ALT: 9 IU/L (ref 0–32)
AST: 11 IU/L (ref 0–40)
Albumin: 3.8 g/dL — ABNORMAL LOW (ref 4.0–5.0)
Alkaline Phosphatase: 146 IU/L — ABNORMAL HIGH (ref 44–121)
BUN/Creatinine Ratio: 13 (ref 9–23)
BUN: 7 mg/dL (ref 6–20)
Bilirubin Total: <0.2 mg/dL (ref 0.0–1.2)
CO2: 20 mmol/L (ref 20–29)
Calcium: 8.5 mg/dL — ABNORMAL LOW (ref 8.7–10.2)
Chloride: 104 mmol/L (ref 96–106)
Creatinine, Ser: 0.54 mg/dL — ABNORMAL LOW (ref 0.57–1.00)
Globulin, Total: 2.2 g/dL (ref 1.5–4.5)
Glucose: 84 mg/dL (ref 70–99)
Potassium: 4.2 mmol/L (ref 3.5–5.2)
Sodium: 138 mmol/L (ref 134–144)
Total Protein: 6 g/dL (ref 6.0–8.5)
eGFR: 127 mL/min/{1.73_m2} (ref >59)

## 2023-05-31 LAB — CBC
Hematocrit: 34.9 % (ref 34.0–46.6)
Hemoglobin: 11.5 g/dL (ref 11.1–15.9)
MCH: 32 pg (ref 26.6–33.0)
MCHC: 33 g/dL (ref 31.5–35.7)
MCV: 97 fL (ref 79–97)
Platelets: 191 10*3/uL (ref 150–450)
RBC: 3.59 x10E6/uL — ABNORMAL LOW (ref 3.77–5.28)
RDW: 12.1 % (ref 11.7–15.4)
WBC: 8.1 10*3/uL (ref 3.4–10.8)

## 2023-06-01 LAB — BILE ACIDS, TOTAL: Bile Acids Total: 3.2 umol/L (ref 0.0–10.0)

## 2023-06-14 ENCOUNTER — Ambulatory Visit (INDEPENDENT_AMBULATORY_CARE_PROVIDER_SITE_OTHER): Payer: Medicaid Other | Admitting: Obstetrics and Gynecology

## 2023-06-14 ENCOUNTER — Other Ambulatory Visit (HOSPITAL_COMMUNITY)
Admission: RE | Admit: 2023-06-14 | Discharge: 2023-06-14 | Disposition: A | Discharge status: Home / Self Care | Payer: Medicaid Other | Source: Ambulatory Visit | Attending: Obstetrics and Gynecology | Admitting: Obstetrics and Gynecology

## 2023-06-14 VITALS — BP 120/76 | HR 82 | Wt 224.0 lb

## 2023-06-14 DIAGNOSIS — Z1339 Encounter for screening examination for other mental health and behavioral disorders: Secondary | ICD-10-CM

## 2023-06-14 DIAGNOSIS — Z8759 Personal history of other complications of pregnancy, childbirth and the puerperium: Secondary | ICD-10-CM

## 2023-06-14 DIAGNOSIS — Z3483 Encounter for supervision of other normal pregnancy, third trimester: Secondary | ICD-10-CM | POA: Diagnosis not present

## 2023-06-14 DIAGNOSIS — M797 Fibromyalgia: Secondary | ICD-10-CM

## 2023-06-14 DIAGNOSIS — F419 Anxiety disorder, unspecified: Secondary | ICD-10-CM

## 2023-06-14 DIAGNOSIS — Z3480 Encounter for supervision of other normal pregnancy, unspecified trimester: Secondary | ICD-10-CM

## 2023-06-14 DIAGNOSIS — Z3A36 36 weeks gestation of pregnancy: Secondary | ICD-10-CM

## 2023-06-14 DIAGNOSIS — F17210 Nicotine dependence, cigarettes, uncomplicated: Secondary | ICD-10-CM

## 2023-06-14 NOTE — Progress Notes (Signed)
   PRENATAL VISIT NOTE  Subjective:  Tonya Hart is a 32 y.o. G2P1001 at [redacted]w[redacted]d being seen today for ongoing prenatal care.  She is currently monitored for the following issues for this low-risk pregnancy and has Recurrent anterior dislocation of shoulder, left; History of pregnancy induced hypertension; Fibromyalgia; Anxiety; Supervision of other normal pregnancy, antepartum; and Cigarette nicotine dependence without complication on their problem list.  Patient reports  fibromyalgia pain, periumbilical/left sided abdominal pain and R rib pain .  Contractions: Irritability. Vag. Bleeding: None.  Movement: Present. Denies leaking of fluid.   The following portions of the patient's history were reviewed and updated as appropriate: allergies, current medications, past family history, past medical history, past social history, past surgical history and problem list.   Objective:   Vitals:   06/14/23 0806  BP: 120/76  Pulse: 82  Weight: 224 lb (101.6 kg)   Fetal Status: Fetal Heart Rate (bpm): 141 Fundal Height: 35 cm Movement: Present     General:  Alert, oriented and cooperative. Patient is in no acute distress.  Skin: Skin is warm and dry. No rash noted.   Cardiovascular: Normal heart rate noted  Respiratory: Normal respiratory effort, no problems with respiration noted  Abdomen: Soft, gravid, appropriate for gestational age. Non tender. Pain/Pressure: Present      Assessment and Plan:  Pregnancy: G2P1001 at [redacted]w[redacted]d 1. Supervision of other normal pregnancy, antepartum 2. [redacted] weeks gestation of pregnancy - Culture, beta strep (group b only) - Cervicovaginal ancillary only( [place])  3. Fibromyalgia Managing ok overall  4. Anxiety Lexapro/buspar  5. Cigarette nicotine dependence without complication  6. History of pregnancy induced hypertension ldASA Normotensive today  Please refer to After Visit Summary for other counseling recommendations.   Return in about 1 week  (around 06/21/2023).  No future appointments.  Lennart Pall, MD

## 2023-06-14 NOTE — Patient Instructions (Signed)
[email or web address]  You will see your results in the Mychart app within 1 week.

## 2023-06-21 ENCOUNTER — Ambulatory Visit (INDEPENDENT_AMBULATORY_CARE_PROVIDER_SITE_OTHER): Payer: Medicaid Other | Admitting: Obstetrics and Gynecology

## 2023-06-21 ENCOUNTER — Encounter: Payer: Self-pay | Admitting: Obstetrics and Gynecology

## 2023-06-21 VITALS — BP 125/74 | HR 98 | Wt 226.0 lb

## 2023-06-21 DIAGNOSIS — Z3480 Encounter for supervision of other normal pregnancy, unspecified trimester: Secondary | ICD-10-CM

## 2023-06-21 DIAGNOSIS — Z8759 Personal history of other complications of pregnancy, childbirth and the puerperium: Secondary | ICD-10-CM

## 2023-06-21 DIAGNOSIS — F419 Anxiety disorder, unspecified: Secondary | ICD-10-CM

## 2023-06-21 NOTE — Progress Notes (Signed)
   PRENATAL VISIT NOTE  Subjective:  Tonya Hart is a 32 y.o. G2P1001 at [redacted]w[redacted]d being seen today for ongoing prenatal care.  She is currently monitored for the following issues for this high-risk pregnancy and has Recurrent anterior dislocation of shoulder, left; History of pregnancy induced hypertension; Fibromyalgia; Anxiety; Supervision of other normal pregnancy, antepartum; and Cigarette nicotine dependence without complication on their problem list.  Patient reports no complaints.  Contractions: Not present. Vag. Bleeding: None.  Movement: Present. Denies leaking of fluid.   The following portions of the patient's history were reviewed and updated as appropriate: allergies, current medications, past family history, past medical history, past social history, past surgical history and problem list.   Objective:   Vitals:   06/21/23 0820  BP: 125/74  Pulse: 98  Weight: 226 lb (102.5 kg)    Fetal Status: Fetal Heart Rate (bpm): 140   Movement: Present     General:  Alert, oriented and cooperative. Patient is in no acute distress.  Skin: Skin is warm and dry. No rash noted.   Cardiovascular: Normal heart rate noted  Respiratory: Normal respiratory effort, no problems with respiration noted  Abdomen: Soft, gravid, appropriate for gestational age.  Pain/Pressure: Present     Pelvic: Cervical exam deferred        Extremities: Normal range of motion.  Edema: Trace  Mental Status: Normal mood and affect. Normal behavior. Normal judgment and thought content.   Assessment and Plan:  Pregnancy: G2P1001 at [redacted]w[redacted]d 1. Supervision of other normal pregnancy, antepartum Patient is doing well without complaints Reviewed vaginal culture results with the patient  2. History of pregnancy induced hypertension Normotensive. Continue ASA  3. Anxiety Continue current medications. Stable  Term labor symptoms and general obstetric precautions including but not limited to vaginal bleeding,  contractions, leaking of fluid and fetal movement were reviewed in detail with the patient. Please refer to After Visit Summary for other counseling recommendations.   Return in about 1 week (around 06/28/2023) for virtual or in person (patient choice), ROB, Low risk.  Future Appointments  Date Time Provider Department Center  06/28/2023  8:30 AM Lennart Pall, MD [facility]  07/05/2023  8:30 AM Milas Hock, MD [facility]    Catalina Antigua, MD

## 2023-06-28 ENCOUNTER — Ambulatory Visit (INDEPENDENT_AMBULATORY_CARE_PROVIDER_SITE_OTHER): Payer: Medicaid Other | Admitting: Obstetrics and Gynecology

## 2023-06-28 VITALS — BP 121/78 | HR 94 | Wt 221.0 lb

## 2023-06-28 DIAGNOSIS — M797 Fibromyalgia: Secondary | ICD-10-CM

## 2023-06-28 DIAGNOSIS — Z3480 Encounter for supervision of other normal pregnancy, unspecified trimester: Secondary | ICD-10-CM

## 2023-06-28 DIAGNOSIS — F17210 Nicotine dependence, cigarettes, uncomplicated: Secondary | ICD-10-CM

## 2023-06-28 DIAGNOSIS — Z8759 Personal history of other complications of pregnancy, childbirth and the puerperium: Secondary | ICD-10-CM

## 2023-06-28 DIAGNOSIS — Z3A38 38 weeks gestation of pregnancy: Secondary | ICD-10-CM

## 2023-06-28 DIAGNOSIS — F419 Anxiety disorder, unspecified: Secondary | ICD-10-CM

## 2023-06-28 NOTE — Progress Notes (Signed)
   PRENATAL VISIT NOTE  Subjective:  Tonya Hart is a 32 y.o. G2P1001 at [redacted]w[redacted]d being seen today for ongoing prenatal care.  She is currently monitored for the following issues for this low-risk pregnancy and has Recurrent anterior dislocation of shoulder, left; History of pregnancy induced hypertension; Fibromyalgia; Anxiety; Supervision of other normal pregnancy, antepartum; and Cigarette nicotine dependence without complication on their problem list.  Patient reports  doing well overall .  Contractions: Not present. Vag. Bleeding: None.  Movement: Present. Denies leaking of fluid.   The following portions of the patient's history were reviewed and updated as appropriate: allergies, current medications, past family history, past medical history, past social history, past surgical history and problem list.   Objective:   Vitals:   06/28/23 0811  BP: 121/78  Pulse: 94  Weight: 221 lb (100.2 kg)   Fetal Status: Fetal Heart Rate (bpm): 152 Fundal Height: 38 cm Movement: Present  Presentation: Vertex  General:  Alert, oriented and cooperative. Patient is in no acute distress.  Skin: Skin is warm and dry. No rash noted.   Cardiovascular: Normal heart rate noted  Respiratory: Normal respiratory effort, no problems with respiration noted  Abdomen: Soft, gravid, appropriate for gestational age.  Pain/Pressure: Present      Assessment and Plan:  Pregnancy: G2P1001 at [redacted]w[redacted]d 1. Supervision of other normal pregnancy, antepartum 2. [redacted] weeks gestation of pregnancy Discussed pdIOL at 41 weeks Discussed option for membrane sweep next appt  3. Anxiety Continue lexapro and buspar  4. Cigarette nicotine dependence without complication Vaping  5. Fibromyalgia Stable  6. History of pregnancy induced hypertension Normotensive  Term labor symptoms and general obstetric precautions including but not limited to vaginal bleeding, contractions, leaking of fluid and fetal movement were reviewed in  detail with the patient. Please refer to After Visit Summary for other counseling recommendations.   Return in about 1 week (around 07/05/2023) for return OB at 39 weeks.  Future Appointments  Date Time Provider Department Center  07/05/2023  8:30 AM Milas Hock, MD [facility]    Lennart Pall, MD

## 2023-06-28 NOTE — Patient Instructions (Signed)
Membrane sweep - process to help bring on labor sooner

## 2023-07-03 NOTE — Progress Notes (Unsigned)
   PRENATAL VISIT NOTE  Subjective:  Tonya Hart is a 32 y.o. G2P1001 at [redacted]w[redacted]d being seen today for ongoing prenatal care.  She is currently monitored for the following issues for this low-risk pregnancy and has Recurrent anterior dislocation of shoulder, left; History of pregnancy induced hypertension; Fibromyalgia; Anxiety; Supervision of other normal pregnancy, antepartum; and Cigarette nicotine dependence without complication on their problem list.  Patient reports {sx:14538}.   .  .   . Denies leaking of fluid.   The following portions of the patient's history were reviewed and updated as appropriate: allergies, current medications, past family history, past medical history, past social history, past surgical history and problem list.   Objective:  There were no vitals filed for this visit.  Fetal Status:           General:  Alert, oriented and cooperative. Patient is in no acute distress.  Skin: Skin is warm and dry. No rash noted.   Cardiovascular: Normal heart rate noted  Respiratory: Normal respiratory effort, no problems with respiration noted  Abdomen: Soft, gravid, appropriate for gestational age.        Pelvic: {Blank single:19197::"Cervical exam performed in the presence of a chaperone","Cervical exam deferred"}        Extremities: Normal range of motion.     Mental Status: Normal mood and affect. Normal behavior. Normal judgment and thought content.   Assessment and Plan:  Pregnancy: G2P1001 at [redacted]w[redacted]d 1. Supervision of other normal pregnancy, antepartum Offered membrane sweep today - pt *** Discussed IOL at 41w - scheduled today ***  2. History of pregnancy induced hypertension BP today ***  3. Cigarette nicotine dependence without complication Vaping  4. Anxiety On lexapro and buspar  {Blank single:19197::"Term","Preterm"} labor symptoms and general obstetric precautions including but not limited to vaginal bleeding, contractions, leaking of fluid and fetal  movement were reviewed in detail with the patient. Please refer to After Visit Summary for other counseling recommendations.   No follow-ups on file.  Future Appointments  Date Time Provider Department Center  07/05/2023  8:30 AM Milas Hock, MD [facility]    Milas Hock, MD

## 2023-07-05 ENCOUNTER — Ambulatory Visit (INDEPENDENT_AMBULATORY_CARE_PROVIDER_SITE_OTHER): Payer: Medicaid Other | Admitting: Obstetrics and Gynecology

## 2023-07-05 VITALS — BP 120/78 | HR 85 | Wt 224.0 lb

## 2023-07-05 DIAGNOSIS — Z8759 Personal history of other complications of pregnancy, childbirth and the puerperium: Secondary | ICD-10-CM

## 2023-07-05 DIAGNOSIS — F419 Anxiety disorder, unspecified: Secondary | ICD-10-CM

## 2023-07-05 DIAGNOSIS — F17210 Nicotine dependence, cigarettes, uncomplicated: Secondary | ICD-10-CM

## 2023-07-05 DIAGNOSIS — Z3480 Encounter for supervision of other normal pregnancy, unspecified trimester: Secondary | ICD-10-CM

## 2023-07-05 DIAGNOSIS — Z3A39 39 weeks gestation of pregnancy: Secondary | ICD-10-CM

## 2023-07-05 NOTE — Patient Instructions (Signed)
Congratulations, you're on your way to having your baby!!!   You now have an induction scheduled.   If your induction is scheduled for midnight, you will arrive at 11:45pm on the date provided on the form form the clinical staff today. [facility] will [facility] (Entrance C) and tell them you are there for your induction. The hospital will only call you to not come if they have NO beds available and need to postpone your admission time.    If your induction is scheduled for the daytime, you will see an appointment time in MyChart. Please DO NOT show up at this time, this is just a placeholder on the schedule. You will get a call when your room is ready and will have 2 hours to arrive. The hospital staff can call anytime starting at 5 am through the rest of the day.   You will also get a call from the pre-admission nurse to go over pre-admission screen about 2 days prior to your induction date.      

## 2023-07-09 ENCOUNTER — Inpatient Hospital Stay (HOSPITAL_COMMUNITY): Payer: Medicaid Other | Admitting: Anesthesiology

## 2023-07-09 ENCOUNTER — Encounter (HOSPITAL_COMMUNITY): Payer: Self-pay | Admitting: Obstetrics and Gynecology

## 2023-07-09 ENCOUNTER — Other Ambulatory Visit: Payer: Self-pay

## 2023-07-09 ENCOUNTER — Inpatient Hospital Stay (HOSPITAL_COMMUNITY)
Admission: AD | Admit: 2023-07-09 | Discharge: 2023-07-11 | DRG: 807 | Disposition: A | Discharge status: Home / Self Care | Payer: Medicaid Other | Attending: Obstetrics & Gynecology | Admitting: Obstetrics & Gynecology

## 2023-07-09 DIAGNOSIS — O99334 Smoking (tobacco) complicating childbirth: Secondary | ICD-10-CM | POA: Diagnosis not present

## 2023-07-09 DIAGNOSIS — Z3A40 40 weeks gestation of pregnancy: Secondary | ICD-10-CM | POA: Diagnosis not present

## 2023-07-09 DIAGNOSIS — O99344 Other mental disorders complicating childbirth: Secondary | ICD-10-CM | POA: Diagnosis not present

## 2023-07-09 DIAGNOSIS — Z7982 Long term (current) use of aspirin: Secondary | ICD-10-CM

## 2023-07-09 DIAGNOSIS — Z3490 Encounter for supervision of normal pregnancy, unspecified, unspecified trimester: Principal | ICD-10-CM

## 2023-07-09 DIAGNOSIS — Z87891 Personal history of nicotine dependence: Secondary | ICD-10-CM | POA: Diagnosis not present

## 2023-07-09 DIAGNOSIS — Z79899 Other long term (current) drug therapy: Secondary | ICD-10-CM

## 2023-07-09 DIAGNOSIS — O480 Post-term pregnancy: Secondary | ICD-10-CM | POA: Diagnosis not present

## 2023-07-09 LAB — TYPE AND SCREEN
ABO/RH(D): O POS
Antibody Screen: NEGATIVE

## 2023-07-09 LAB — RPR: RPR Ser Ql: NONREACTIVE

## 2023-07-09 LAB — CBC
HCT: 36.2 % (ref 36.0–46.0)
Hemoglobin: 12.1 g/dL (ref 12.0–15.0)
MCH: 30.8 pg (ref 26.0–34.0)
MCHC: 33.4 g/dL (ref 30.0–36.0)
MCV: 92.1 fL (ref 80.0–100.0)
Platelets: 188 10*3/uL (ref 150–400)
RBC: 3.93 MIL/uL (ref 3.87–5.11)
RDW: 13.1 % (ref 11.5–15.5)
WBC: 8 10*3/uL (ref 4.0–10.5)
nRBC: 0 % (ref 0.0–0.2)

## 2023-07-09 MED ORDER — DIPHENHYDRAMINE HCL 50 MG/ML IJ SOLN: 12.5000 mg | INTRAMUSCULAR | Status: DC | PRN

## 2023-07-09 MED ORDER — OXYTOCIN-SODIUM CHLORIDE 30-0.9 UT/500ML-% IV SOLN
1.0000 m[IU]/min | INTRAVENOUS | Status: DC
  Administered 2023-07-09: 2 m[IU]/min via INTRAVENOUS
  Filled 2023-07-09: qty 500

## 2023-07-09 MED ORDER — LACTATED RINGERS IV SOLN
500.0000 mL | Freq: Once | INTRAVENOUS | Status: AC
  Administered 2023-07-09: 500 mL via INTRAVENOUS

## 2023-07-09 MED ORDER — TERBUTALINE SULFATE 1 MG/ML IJ SOLN: 0.2500 mg | Freq: Once | INTRAMUSCULAR | Status: DC | PRN

## 2023-07-09 MED ORDER — PHENYLEPHRINE 80 MCG/ML (10ML) SYRINGE FOR IV PUSH (FOR BLOOD PRESSURE SUPPORT): 80.0000 ug | PREFILLED_SYRINGE | INTRAVENOUS | Status: DC | PRN

## 2023-07-09 MED ORDER — ONDANSETRON HCL 4 MG/2ML IJ SOLN: 4.0000 mg | INTRAMUSCULAR | Status: DC | PRN

## 2023-07-09 MED ORDER — ACETAMINOPHEN 325 MG PO TABS: 650.0000 mg | ORAL_TABLET | ORAL | Status: DC | PRN

## 2023-07-09 MED ORDER — OXYTOCIN-SODIUM CHLORIDE 30-0.9 UT/500ML-% IV SOLN
1.0000 m[IU]/min | INTRAVENOUS | Status: DC
  Administered 2023-07-09: 2 m[IU]/min via INTRAVENOUS

## 2023-07-09 MED ORDER — LACTATED RINGERS IV SOLN: INTRAVENOUS | Status: DC

## 2023-07-09 MED ORDER — WITCH HAZEL-GLYCERIN EX PADS
1.0000 | MEDICATED_PAD | CUTANEOUS | Status: DC | PRN
  Administered 2023-07-11: 1 via TOPICAL

## 2023-07-09 MED ORDER — ONDANSETRON HCL 4 MG/2ML IJ SOLN
4.0000 mg | Freq: Four times a day (QID) | INTRAMUSCULAR | Status: DC | PRN
  Administered 2023-07-09: 4 mg via INTRAVENOUS
  Filled 2023-07-09: qty 2

## 2023-07-09 MED ORDER — DIPHENHYDRAMINE HCL 25 MG PO CAPS: 25.0000 mg | ORAL_CAPSULE | Freq: Four times a day (QID) | ORAL | Status: DC | PRN

## 2023-07-09 MED ORDER — SOD CITRATE-CITRIC ACID 500-334 MG/5ML PO SOLN: 30.0000 mL | ORAL | Status: DC | PRN

## 2023-07-09 MED ORDER — EPHEDRINE 5 MG/ML INJ: 10.0000 mg | INTRAVENOUS | Status: DC | PRN

## 2023-07-09 MED ORDER — IBUPROFEN 600 MG PO TABS
600.0000 mg | ORAL_TABLET | Freq: Four times a day (QID) | ORAL | Status: DC
  Administered 2023-07-09 – 2023-07-11 (×7): 600 mg via ORAL
  Filled 2023-07-09 (×7): qty 1

## 2023-07-09 MED ORDER — LACTATED RINGERS IV SOLN: 500.0000 mL | INTRAVENOUS | Status: DC | PRN

## 2023-07-09 MED ORDER — LIDOCAINE-EPINEPHRINE (PF) 2 %-1:200000 IJ SOLN
INTRAMUSCULAR | Status: DC | PRN
  Administered 2023-07-09: 8 mL via EPIDURAL

## 2023-07-09 MED ORDER — COCONUT OIL OIL: 1.0000 | TOPICAL_OIL | Status: DC | PRN

## 2023-07-09 MED ORDER — PRENATAL MULTIVITAMIN CH
1.0000 | ORAL_TABLET | Freq: Every day | ORAL | Status: DC
  Administered 2023-07-10 – 2023-07-11 (×2): 1 via ORAL
  Filled 2023-07-09 (×2): qty 1

## 2023-07-09 MED ORDER — LIDOCAINE HCL (PF) 1 % IJ SOLN
INTRAMUSCULAR | Status: DC | PRN
  Administered 2023-07-09 (×2): 4 mL via EPIDURAL

## 2023-07-09 MED ORDER — SIMETHICONE 80 MG PO CHEW: 80.0000 mg | CHEWABLE_TABLET | ORAL | Status: DC | PRN

## 2023-07-09 MED ORDER — LIDOCAINE HCL (PF) 1 % IJ SOLN: 30.0000 mL | INTRAMUSCULAR | Status: DC | PRN

## 2023-07-09 MED ORDER — MISOPROSTOL 50MCG HALF TABLET
50.0000 ug | ORAL_TABLET | Freq: Once | ORAL | Status: AC
  Administered 2023-07-09: 50 ug via BUCCAL
  Filled 2023-07-09: qty 1

## 2023-07-09 MED ORDER — ZOLPIDEM TARTRATE 5 MG PO TABS: 5.0000 mg | ORAL_TABLET | Freq: Every evening | ORAL | Status: DC | PRN

## 2023-07-09 MED ORDER — SENNOSIDES-DOCUSATE SODIUM 8.6-50 MG PO TABS
2.0000 | ORAL_TABLET | Freq: Every day | ORAL | Status: DC
  Administered 2023-07-10 – 2023-07-11 (×2): 2 via ORAL
  Filled 2023-07-09 (×2): qty 2

## 2023-07-09 MED ORDER — BENZOCAINE-MENTHOL 20-0.5 % EX AERO
1.0000 | INHALATION_SPRAY | CUTANEOUS | Status: DC | PRN
  Administered 2023-07-09 – 2023-07-11 (×2): 1 via TOPICAL
  Filled 2023-07-09 (×2): qty 56

## 2023-07-09 MED ORDER — OXYTOCIN-SODIUM CHLORIDE 30-0.9 UT/500ML-% IV SOLN
2.5000 [IU]/h | INTRAVENOUS | Status: DC
  Administered 2023-07-09: 2.5 [IU]/h via INTRAVENOUS

## 2023-07-09 MED ORDER — OXYCODONE HCL 5 MG PO TABS
10.0000 mg | ORAL_TABLET | ORAL | Status: DC | PRN
  Administered 2023-07-09 – 2023-07-11 (×5): 10 mg via ORAL
  Filled 2023-07-09 (×5): qty 2

## 2023-07-09 MED ORDER — OXYCODONE HCL 5 MG PO TABS
5.0000 mg | ORAL_TABLET | ORAL | Status: DC | PRN
  Administered 2023-07-11 (×2): 5 mg via ORAL
  Filled 2023-07-09 (×2): qty 1

## 2023-07-09 MED ORDER — FENTANYL-BUPIVACAINE-NACL 0.5-0.125-0.9 MG/250ML-% EP SOLN
12.0000 mL/h | EPIDURAL | Status: DC | PRN
  Administered 2023-07-09: 12 mL/h via EPIDURAL
  Filled 2023-07-09: qty 250

## 2023-07-09 MED ORDER — OXYTOCIN BOLUS FROM INFUSION
333.0000 mL | Freq: Once | INTRAVENOUS | Status: AC
  Administered 2023-07-09: 333 mL via INTRAVENOUS

## 2023-07-09 MED ORDER — FENTANYL CITRATE (PF) 100 MCG/2ML IJ SOLN
50.0000 ug | INTRAMUSCULAR | Status: DC | PRN
  Administered 2023-07-09: 100 ug via INTRAVENOUS
  Filled 2023-07-09: qty 2

## 2023-07-09 MED ORDER — ONDANSETRON HCL 4 MG PO TABS: 4.0000 mg | ORAL_TABLET | ORAL | Status: DC | PRN

## 2023-07-09 MED ORDER — DIBUCAINE (PERIANAL) 1 % EX OINT
1.0000 | TOPICAL_OINTMENT | CUTANEOUS | Status: DC | PRN
  Administered 2023-07-11: 1 via RECTAL
  Filled 2023-07-09: qty 28

## 2023-07-09 NOTE — Anesthesia Procedure Notes (Addendum)
Epidural Patient location during procedure: OB Start time: 07/09/2023 3:16 PM End time: 07/09/2023 3:20 PM  Staffing Anesthesiologist: Kaylyn Layer, MD Performed: anesthesiologist   Preanesthetic Checklist Completed: patient identified, IV checked, risks and benefits discussed, monitors and equipment checked, pre-op evaluation and timeout performed  Epidural Patient position: sitting Prep: DuraPrep and site prepped and draped Patient monitoring: continuous pulse ox, blood pressure and heart rate Approach: midline Location: L3-L4 Injection technique: LOR air  Needle:  Needle type: Tuohy  Needle gauge: 17 G Needle length: 9 cm Needle insertion depth: 6 cm Catheter type: closed end flexible Catheter size: 19 Gauge Catheter at skin depth: 11 cm Test dose: negative and Other (1% lidocaine)  Assessment Events: blood not aspirated, no cerebrospinal fluid, injection not painful, no injection resistance, no paresthesia and negative IV test  Additional Notes Patient identified. Risks, benefits, and alternatives discussed with patient including but not limited to bleeding, infection, nerve damage, paralysis, failed block, incomplete pain control, headache, blood pressure changes, nausea, vomiting, reactions to medication, itching, and postpartum back pain. Confirmed with bedside nurse the patient's most recent platelet count. Confirmed with patient that they are not currently taking any anticoagulation, have any bleeding history, or any family history of bleeding disorders. Patient expressed understanding and wished to proceed. All questions were answered. Sterile technique was used throughout the entire procedure. Please see nursing notes for vital signs.   Crisp LOR on second attempt. Test dose was given through epidural catheter and negative prior to continuing to dose epidural or start infusion. Warning signs of high block given to the patient including shortness of breath,  tingling/numbness in hands, complete motor block, or any concerning symptoms with instructions to call for help. Patient was given instructions on fall risk and not to get out of bed. All questions and concerns addressed with instructions to call with any issues or inadequate analgesia.  Reason for block:procedure for pain

## 2023-07-09 NOTE — H&P (Signed)
OBSTETRIC ADMISSION HISTORY AND PHYSICAL  Tonya Hart is a 32 y.o. female G2P1001 with IUP at [redacted]w[redacted]d by 11wk U/S presenting for eIOL. She reports +FMs, No LOF, no VB, no blurry vision, headaches or peripheral edema, and RUQ pain.  She plans on breast feeding. She request POPs for birth control. She received her prenatal care at [facility]  Dating: By 11wk U/S --->  Estimated Date of Delivery: 07/07/23 Sono:  @[redacted]w[redacted]d , CWD, normal anatomy, breech presentation, posterior placenta, 303g, 74% EFW  Prenatal History/Complications: None, hx gHTN, on aspirin, no problems this pregnancy Nursing Staff Provider  Office Location [place] Dating  07/07/2023, by Ultrasound  [facility] Model     Language  English Anatomy US  Normal  Flu Vaccine  12/20/22 Genetic/Carrier Screen  NIPS:  low risk female AFP:   neg Horizon:  TDaP Vaccine   Done 04/09/23 Hgb A1C or  GTT Early  Third trimester:  neg  COVID Vaccine    LAB RESULTS   Rhogam  O/Positive/-- (01/31 1423)  Blood Type O/Positive/-- (01/31 1423)   Baby Feeding Plan Breast Antibody Negative (01/31 1423)  Contraception POPs Rubella 1.70 (01/31 1423)  Circumcision Yes in house RPR Non Reactive (05/20 0811)   Pediatrician  [facility] HBsAg Negative (01/31 1423)   Support Person Joselyn Glassman HCVAb Non Reactive (01/31 1423)   Prenatal Classes  HIV Non Reactive (05/20 0811)     BTL Consent N/a GBS   (For PCN allergy, check sensitivities)   VBAC Consent  Pap Diagnosis  Date Value Ref Range Status  12/20/2022   Final   - Negative for intraepithelial lesion or malignancy (NILM)         DME Rx [x]  BP cuff [ ]  Weight Scale Waterbirth  [ ]  Class [ ]  Consent [ ]  CNM visit  PHQ9 & GAD7 [  ] new OB [ x] 28 weeks  [  x] 36 weeks Induction  [ ]  Orders Entered [ ] Foley Y/N   Past Medical History: Past Medical History:  Diagnosis Date   Anxiety and depression    no current medications   Asthma    Endometriosis    Environmental and seasonal allergies    Gestational  hypertension    PCOS (polycystic ovarian syndrome)    Seasonal allergies    Past Surgical History: Past Surgical History:  Procedure Laterality Date   SHOULDER ARTHROSCOPY WITH LABRAL REPAIR Left 08/11/2021   Procedure: LEFT SHOULDER ARTHROSCOPY WITH LABRAL REPAIR;  Surgeon: Cammy Copa, MD;  Location: [facility] OR;  Service: Orthopedics;  Laterality: Left;   TONSILLECTOMY     TONSILLECTOMY AND ADENOIDECTOMY     Obstetrical History: OB History     Gravida  2   Para  1   Term  1   Preterm  0   AB  0   Living  1      SAB  0   IAB  0   Ectopic  0   Multiple  0   Live Births  1          Social History Social History   Socioeconomic History   Marital status: Single    Spouse name: Not on file   Number of children: 1   Years of education: Not on file   Highest education level: Not on file  Occupational History   Occupation: ISS    Employer: [employer]  Tobacco Use   Smoking status: Former    Current packs/day: 0.00  Types: Cigarettes, E-cigarettes    Quit date: 2019    Years since quitting: 5.6   Smokeless tobacco: Never  Vaping Use   Vaping status: Every Day   Last attempt to quit: 08/04/2020   Substances: Nicotine, THC  Substance and Sexual Activity   Alcohol use: Not Currently    Comment: 3 drinks/week, beer or liquor   Drug use: Yes    Frequency: 7.0 times per week    Types: Marijuana   Sexual activity: Yes    Partners: Male    Birth control/protection: None  Other Topics Concern   Not on file  Social History Narrative   Right Handed    Lives in a one story home    Social Determinants of Health   Financial Resource Strain: Not on file  Food Insecurity: No Food Insecurity (07/09/2023)   Hunger Vital Sign    Worried About Running Out of Food in the Last Year: Never true    Ran Out of Food in the Last Year: Never true  Transportation Needs: No Transportation Needs (07/09/2023)   PRAPARE - Scientist, research (physical sciences) (Medical): No    Lack of Transportation (Non-Medical): No  Physical Activity: Not on file  Stress: Not on file  Social Connections: Unknown (09/24/2022)   Received from [facility], [facility]   Social Network    Social Network: Not on file   Family History: Family History  Problem Relation Age of Onset   Thyroid disease Mother    Hyperlipidemia Mother    Hypertension Mother    Cataracts Mother    Hyperlipidemia Father    COPD Father    Heart attack Father    Hypertension Father    Diabetes Father    Prostate cancer Maternal Grandfather    Allergies: Allergies  Allergen Reactions   Chicken Meat (Diagnostic) Anaphylaxis   Bee Venom Hives    swelling   Latex Hives   Soap Hives    Dove soap   Medications Prior to Admission  Medication Sig Dispense Refill Last Dose   aspirin 81 MG chewable tablet Chew 1 tablet (81 mg total) by mouth daily. Start after 12 weeks 60 tablet 3    busPIRone (BUSPAR) 15 MG tablet Take 15 mg by mouth 3 (three) times daily.      escitalopram (LEXAPRO) 20 MG tablet Take 20 mg by mouth daily.      metoCLOPramide (REGLAN) 10 MG tablet Take 1 tablet (10 mg total) by mouth every 6 (six) hours as needed for nausea (headache). 30 tablet 0    Prenatal MV & Min w/FA-DHA (PRENATAL GUMMIES PO) Take by mouth.      promethazine (PHENERGAN) 25 MG tablet Take 1 tablet (25 mg total) by mouth every 6 (six) hours as needed for nausea or vomiting. 30 tablet 0    Review of Systems  All systems reviewed and negative except as stated in HPI  Blood pressure 114/76, pulse 94, temperature 98.2 F (36.8 C), temperature source Oral, resp. rate 18, height 5\' 9"  (1.753 m), weight 228 lb (103.4 kg), last menstrual period 10/09/2022, currently breastfeeding. General appearance: alert, cooperative, appears stated age, and no distress Lungs: clear to auscultation bilaterally Heart: regular rate and rhythm Abdomen: soft, non-tender; bowel sounds normal Pelvic:  normal external female genitalia Dilation: 2 Effacement (%): 60 Station: -1 Presentation: Vertex Exam by:: Everlena Cooper CNM  Extremities: Homans sign is negative, no sign of DVT DTR's normal Presentation: cephalic Fetal monitoring -  Baseline: 135 bpm, Variability: Good {> 6 bpm), Accelerations: Reactive, and Decelerations: Absent Uterine activity - None  Prenatal labs: ABO, Rh: --/--/O POS (08/19 0737) Antibody: NEG (08/19 0737) Rubella: 1.70 (01/31 1423) RPR: Non Reactive (05/20 0811)  HBsAg: Negative (01/31 1423)  HIV: Non Reactive (05/20 0811)  GBS: Negative/-- (07/25 0807)  2hr GTT: 87/142/77 Genetic screening normal Anatomy US normal  Prenatal Transfer Tool  Maternal Diabetes: No Genetic Screening: Normal Maternal Ultrasounds/Referrals: Normal Fetal Ultrasounds or other Referrals:  None Maternal Substance Abuse:  No Significant Maternal Medications:  None Significant Maternal Lab Results:  Group B Strep negative Number of Prenatal Visits:greater than 3 verified prenatal visits Other Comments:  None  Results for orders placed or performed during the hospital encounter of 07/09/23 (from the past 24 hour(s))  CBC   Collection Time: 07/09/23  7:37 AM  Result Value Ref Range   WBC 8.0 4.0 - 10.5 K/uL   RBC 3.93 3.87 - 5.11 MIL/uL   Hemoglobin 12.1 12.0 - 15.0 g/dL   HCT 16.1 09.6 - 04.5 %   MCV 92.1 80.0 - 100.0 fL   MCH 30.8 26.0 - 34.0 pg   MCHC 33.4 30.0 - 36.0 g/dL   RDW 40.9 81.1 - 91.4 %   Platelets 188 150 - 400 K/uL   nRBC 0.0 0.0 - 0.2 %  Type and screen [facility]   Collection Time: 07/09/23  7:37 AM  Result Value Ref Range   ABO/RH(D) O POS    Antibody Screen NEG    Sample Expiration      07/12/2023,2359 Performed at [facility], [address] [place], [place] [address]    Patient Active Problem List   Diagnosis Date Noted   Pregnancy 07/09/2023   Cigarette nicotine dependence without complication 04/09/2023   History  of pregnancy induced hypertension 12/20/2022   Fibromyalgia 12/20/2022   Anxiety 12/20/2022   Supervision of other normal pregnancy, antepartum 12/20/2022   Recurrent anterior dislocation of shoulder, left 05/13/2021   Assessment/Plan:  Tonya Hart is a 32 y.o. G2P1001 at [redacted]w[redacted]d here for eIOL  #Labor: Started on Pitocin since she was 2.5, after SNM check, stopped Pitocin and switched to buccal cytotec w/ ambulation. Will attempt AROM at next check and restart Pitocin if appropriate. #Pain: Planning epidural #FWB: Cat 1 #ID: GBS neg #MOF: breast #MOC: POPs #Circ:  yes  Bernerd Limbo, CNM  07/09/2023, 9:37 AM

## 2023-07-09 NOTE — Discharge Summary (Signed)
Postpartum Discharge Summary     Patient Name: Tonya Hart DOB: 06-27-1991 MRN: [id number]  Date of admission: 07/09/2023 Delivery date:07/09/2023 Delivering provider: Edd Arbour R Date of discharge: 07/11/2023  Admitting diagnosis: elective induction of labor Intrauterine pregnancy: [redacted]w[redacted]d     Secondary diagnosis:  Principal Problem:   Pregnancy  Additional problems: None    Discharge diagnosis: Term Pregnancy Delivered                                              Post partum procedures: None Augmentation: AROM, Pitocin, and Cytotec Complications: None  Hospital course: Induction of Labor With Vaginal Delivery   32 y.o. yo [id number] at [redacted]w[redacted]d was admitted to the hospital 07/09/2023 for induction of labor.  Indication for induction: Elective.  Patient had an uncomplicated vaginal delivery. Membrane Rupture Time/Date: 2:48 PM,07/09/2023  Delivery Method:Vaginal, Spontaneous Operative Delivery:N/A Episiotomy: None Lacerations:  Labial Details of delivery can be found in separate delivery note.  Patient had a postpartum course complicated bynothing. Patient is discharged home 07/11/23.  Newborn Data: Birth date:07/09/2023 Birth time:7:28 PM Gender:Female Living status:Living Apgars:8 ,9  Weight:3860 g  Magnesium Sulfate received: No BMZ received: No Rhophylac:N/A MMR:N/A T-DaP:Given prenatally Flu: N/A Transfusion:No  Physical exam  Vitals:   07/10/23 0730 07/10/23 1144 07/10/23 2235 07/11/23 0403  BP: 112/73 111/78 117/81 112/74  Pulse: 80 74 74 70  Resp: 16 16 18 18   Temp: 98 F (36.7 C) 98.2 F (36.8 C) 98 F (36.7 C) 98.2 F (36.8 C)  TempSrc: Oral Oral Oral Oral  SpO2:   99% 100%  Weight:      Height:       General: alert Lochia: appropriate Uterine Fundus: firm Incision: N/A DVT Evaluation: No evidence of DVT seen on physical exam. Labs: Lab Results  Component Value Date   WBC 10.1 07/10/2023   HGB 10.9 (L) 07/10/2023   HCT 33.2 (L)  07/10/2023   MCV 91.5 07/10/2023   PLT 154 07/10/2023      Latest Ref Rng & Units 05/30/2023    8:12 AM  CMP  Glucose 70 - 99 mg/dL 84   BUN 6 - 20 mg/dL 7   Creatinine 1.47 - 8.29 mg/dL 5.62   Sodium 130 - 865 mmol/L 138   Potassium 3.5 - 5.2 mmol/L 4.2   Chloride 96 - 106 mmol/L 104   CO2 20 - 29 mmol/L 20   Calcium 8.7 - 10.2 mg/dL 8.5   Total Protein 6.0 - 8.5 g/dL 6.0   Total Bilirubin 0.0 - 1.2 mg/dL <7.8   Alkaline Phos 44 - 121 IU/L 146   AST 0 - 40 IU/L 11   ALT 0 - 32 IU/L 9    Edinburgh Score:    07/10/2023    2:00 AM  Edinburgh Postnatal Depression Scale Screening Tool  I have been able to laugh and see the funny side of things. 0  I have looked forward with enjoyment to things. 0  I have blamed myself unnecessarily when things went wrong. 1  I have been anxious or worried for no good reason. 2  I have felt scared or panicky for no good reason. 1  Things have been getting on top of me. 1  I have been so unhappy that I have had difficulty sleeping. 0  I have felt sad or miserable. 1  I have been so unhappy that I have been crying. 0  The thought of harming myself has occurred to me. 0  Edinburgh Postnatal Depression Scale Total 6   After visit meds:  Allergies as of 07/11/2023       Reactions   Chicken Meat (diagnostic) Anaphylaxis   Bee Venom Hives   swelling   Latex Hives   Soap Hives   Dove soap        Medication List     STOP taking these medications    aspirin 81 MG chewable tablet       TAKE these medications    busPIRone 15 MG tablet Commonly known as: BUSPAR Take 15 mg by mouth 3 (three) times daily.   docusate sodium 100 MG capsule Commonly known as: Colace Take 1 capsule (100 mg total) by mouth 2 (two) times daily for 14 days.   escitalopram 20 MG tablet Commonly known as: LEXAPRO Take 20 mg by mouth daily.   ibuprofen 600 MG tablet Commonly known as: ADVIL Take 1 tablet (600 mg total) by mouth every 6 (six) hours as  needed.   metoCLOPramide 10 MG tablet Commonly known as: Reglan Take 1 tablet (10 mg total) by mouth every 6 (six) hours as needed for nausea (headache).   oxyCODONE-acetaminophen 5-325 MG tablet Commonly known as: PERCOCET/ROXICET Take 1 tablet by mouth every 6 (six) hours as needed for severe pain (back pain).   PRENATAL GUMMIES PO Take by mouth.   promethazine 25 MG tablet Commonly known as: PHENERGAN Take 1 tablet (25 mg total) by mouth every 6 (six) hours as needed for nausea or vomiting.      Discharge home in stable condition  Infant Feeding: Breast Infant Disposition:home with mother Discharge instruction: per After Visit Summary and Postpartum booklet. Activity: Advance as tolerated. Pelvic rest for 6 weeks.  Diet: routine diet  Future Appointments: Future Appointments  Date Time Provider Department Center  08/20/2023  9:30 AM Lesly Dukes, MD [facility]   Follow up Visit: Message sent to [facility] on 07/11/23 by Edd Arbour, CNM Please schedule this patient for a In person postpartum visit in 6 weeks with the following provider: Any provider. Additional Postpartum F/U: None   Low risk pregnancy complicated by:  None Delivery mode:  Vaginal, Spontaneous Anticipated Birth Control:  POPs   07/11/2023 Lawtey Bing, MD

## 2023-07-09 NOTE — Anesthesia Preprocedure Evaluation (Signed)
Anesthesia Evaluation  Patient identified by MRN, date of birth, ID band Patient awake    Reviewed: Allergy & Precautions, Patient's Chart, lab work & pertinent test results  History of Anesthesia Complications Negative for: history of anesthetic complications  Airway Mallampati: II  TM Distance: >3 FB Neck ROM: Full    Dental no notable dental hx.    Pulmonary asthma , former smoker   Pulmonary exam normal        Cardiovascular negative cardio ROS Normal cardiovascular exam     Neuro/Psych   Anxiety Depression    negative neurological ROS     GI/Hepatic negative GI ROS, Neg liver ROS,,,  Endo/Other  negative endocrine ROS    Renal/GU negative Renal ROS  negative genitourinary   Musculoskeletal  (+)  Fibromyalgia -  Abdominal   Peds  Hematology negative hematology ROS (+)   Anesthesia Other Findings Day of surgery medications reviewed with patient.  Reproductive/Obstetrics (+) Pregnancy                             Anesthesia Physical Anesthesia Plan  ASA: 2  Anesthesia Plan: Epidural   Post-op Pain Management:    Induction:   PONV Risk Score and Plan: Treatment may vary due to age or medical condition  Airway Management Planned: Natural Airway  Additional Equipment: Fetal Monitoring  Intra-op Plan:   Post-operative Plan:   Informed Consent: I have reviewed the patients History and Physical, chart, labs and discussed the procedure including the risks, benefits and alternatives for the proposed anesthesia with the patient or authorized representative who has indicated his/her understanding and acceptance.       Plan Discussed with:   Anesthesia Plan Comments:        Anesthesia Quick Evaluation

## 2023-07-09 NOTE — Progress Notes (Signed)
Tonya Hart is a 32 y.o. G2P1001 at [redacted]w[redacted]d by  admitted for induction of labor due to Elective at term.  Subjective: Feeling well and encouraged about labor process.   Objective: BP 116/78   Pulse 73   Temp 98.2 F (36.8 C) (Oral)   Resp 18   Ht 5\' 9"  (1.753 m)   Wt 103.4 kg   LMP 10/09/2022   BMI 33.67 kg/m  No intake/output data recorded. No intake/output data recorded.  FHT:  FHR: 140 bpm, variability: moderate,  accelerations:  Present,  decelerations:  Absent UC:   irregular, every 3-8 minutes SVE:   Dilation: 3.5 Effacement (%): 70 Station: 0 Exam by:: Everlena Cooper SNM  Labs: Lab Results  Component Value Date   WBC 8.0 07/09/2023   HGB 12.1 07/09/2023   HCT 36.2 07/09/2023   MCV 92.1 07/09/2023   PLT 188 07/09/2023    Assessment / Plan: Induction of labor due to elective term,  progressing well on cytotec buccal AROM clear 1454 will start pitocin 2x2 after epidural.  Labor: Progressing normally Fetal Wellbeing:  Category I Pain Control:  planning Epidural Anticipated MOD:  NSVD  Cleda Mccreedy, Student-MidWife 07/09/2023, 2:55 PM

## 2023-07-10 LAB — CBC
HCT: 33.2 % — ABNORMAL LOW (ref 36.0–46.0)
Hemoglobin: 10.9 g/dL — ABNORMAL LOW (ref 12.0–15.0)
MCH: 30 pg (ref 26.0–34.0)
MCHC: 32.8 g/dL (ref 30.0–36.0)
MCV: 91.5 fL (ref 80.0–100.0)
Platelets: 154 10*3/uL (ref 150–400)
RBC: 3.63 MIL/uL — ABNORMAL LOW (ref 3.87–5.11)
RDW: 13.2 % (ref 11.5–15.5)
WBC: 10.1 10*3/uL (ref 4.0–10.5)
nRBC: 0 % (ref 0.0–0.2)

## 2023-07-10 MED ORDER — SUCROSE 24% NICU/PEDS ORAL SOLUTION: 0.5000 mL | OROMUCOSAL | Status: DC | PRN

## 2023-07-10 MED ORDER — EPINEPHRINE TOPICAL FOR CIRCUMCISION 0.1 MG/ML: 1.0000 [drp] | TOPICAL | Status: DC | PRN

## 2023-07-10 MED ORDER — GELATIN ABSORBABLE 12-7 MM EX MISC: 1.0000 | Freq: Once | CUTANEOUS | Status: DC | PRN

## 2023-07-10 MED ORDER — LIDOCAINE 1% INJECTION FOR CIRCUMCISION: 0.8000 mL | INJECTION | Freq: Once | INTRAVENOUS | Status: DC

## 2023-07-10 MED ORDER — WHITE PETROLATUM EX OINT: 1.0000 | TOPICAL_OINTMENT | CUTANEOUS | Status: DC | PRN

## 2023-07-10 NOTE — Anesthesia Postprocedure Evaluation (Signed)
Anesthesia Post Note  Patient: Tonya Hart  Procedure(s) Performed: AN AD HOC LABOR EPIDURAL     Patient location during evaluation: Mother Baby Anesthesia Type: Epidural Level of consciousness: awake and alert Pain management: pain level controlled Vital Signs Assessment: post-procedure vital signs reviewed and stable Respiratory status: spontaneous breathing, nonlabored ventilation and respiratory function stable Cardiovascular status: stable Postop Assessment: no headache, no backache and epidural receding Anesthetic complications: no Comments: Per SRNA   No notable events documented.  Last Vitals:  Vitals:   07/10/23 0335 07/10/23 0730  BP: 115/73 112/73  Pulse: 83 80  Resp: 16 16  Temp: 36.5 C 36.7 C  SpO2: 100%     Last Pain:  Vitals:   07/10/23 0730  TempSrc: Oral  PainSc: 5    Pain Goal:                   Akshath Mccarey

## 2023-07-10 NOTE — Clinical Social Work Maternal (Signed)
CLINICAL SOCIAL WORK MATERNAL/CHILD NOTE  Patient Details  Name: Tonya Hart MRN: [id number] Date of Birth: 1991-09-21  Date:  07/10/2023  Clinical Social Worker Initiating Note:  Willaim Rayas Efosa Treichler Date/Time: Initiated:  07/10/23/1352     Child's Name:  Tonya Hart 07/09/2023   Biological Parents:  Mother, Father Tonya Hart 03[birth year]22-93, Tonya Hart 05/27/1992)   Need for Interpreter:  None   Reason for Referral:  Current Substance Use/Substance Use During Pregnancy     Address:  [address] [place] [address]    Phone number:  [phone] (home)     Additional phone number:   Household Members/Support Persons (HM/SP):   Household Member/Support Person 1, Household Member/Support Person 2, Household Member/Support Person 3   HM/SP Name Relationship DOB or Age  HM/SP -1 Tonya Hart FOB 05/27/1992  HM/SP -2 Tonya Hart daughter 04/01/2021  HM/SP -3 Tonya Hart mom 12/19/1966  HM/SP -4        HM/SP -5        HM/SP -6        HM/SP -7        HM/SP -8          Natural Supports (not living in the home):      Professional Supports: None   Employment: Unemployed   Type of Work:     Education:  Research scientist (physical sciences)   Homebound arranged:    Surveyor, quantity Resources:  [employer]   Other Resources:  Archivist Considerations Which May Impact Care:    Strengths:  Ability to meet basic needs  , Home prepared for child  , Pediatrician chosen   Psychotropic Medications:         Pediatrician:     ([facility], Production manager)  Pediatrician List:   Engineer, [place]    [place]    [facility]      Pediatrician Fax Number:    Risk Factors/Current Problems:  Substance Use     Cognitive State:  Able to Concentrate  , Alert     Mood/Affect:  Calm  , Comfortable  , Interested     CSW Assessment: CSW received consult for THC use during pregnancy, hx Anxiety and Depression. CSW met  with MOB to complete assessment and offer support. CSW entered the room and observed MOB in bed nursing the infant and FOB at bedside. CSW introduced self, CSW role and reason for visit. MOB was agreeable and allowed FOB to remain in the room. CSW inquired about how MOB was feeling, MOB reported feeling pain but overall good. CSW inquired if MOB was comfortable enough to complete the assessment, MOB replied yes. CSW confirmed MOB address and phone number, MOB reported the address and phone number on file were correct.   CSW inquired about MOB MH hx, MOB reported she was diagnosed 7 or 8 years ago. MOB reported she was taking Lexapro and Buspar prior to pregnancy, MOB reported she plans to restart the medication now that she has delivered. MOB reported she did experienced PPD after the birth of her daughter, MOB report it lasted about 8 weeks but resolved after that.  CSW assessed for safety, MOB denied any SI or HI. CSW provided education regarding the baby blues period vs. perinatal mood disorders, discussed treatment and gave resources for mental health follow up if concerns arise.  CSW recommends self-evaluation during the postpartum time period using the New Mom  Checklist from Postpartum Progress and encouraged MOB to contact a medical professional if symptoms are noted at any time.    CSW inquired about MOB THC use during pregnancy, MOB reported she has fibromyalgia and was  unable to take her nerve blockers or pain medication during the pregnancy so she used THC to manage her pain.  CSW explained the hospital drug screen policy, MOB verbalized understanding, CSW notified MOB infant UDS was positive for [facility] and CDS was pending. CSW informed MOB a CPS report would be made to [facility], MOB verbalized understanding.   CSW provided review of Sudden Infant Death Syndrome (SIDS) precautions.MOB reported they have all necessary items for the infant including a bassinet and car seat.    CSW identifies no  further need for intervention and no barriers to discharge at this time.  CSW Plan/Description:  No Further Intervention Required/No Barriers to Discharge, Sudden Infant Death Syndrome (SIDS) Education, Perinatal Mood and Anxiety Disorder (PMADs) Education, Hospital Drug Screen Policy Information, CSW Will Continue to Monitor Umbilical Cord Tissue Drug Screen Results and Make Report if [place], Child Protective Service Report      Maud Deed, LCSW 07/10/2023, 1:56 PM

## 2023-07-10 NOTE — Progress Notes (Signed)
Post Partum Day 1 Subjective: no complaints, up ad lib, voiding, and tolerating PO  Objective: Blood pressure 112/73, pulse 80, temperature 98 F (36.7 C), temperature source Oral, resp. rate 16, height 5\' 9"  (1.753 m), weight 103.4 kg, last menstrual period 10/09/2022, SpO2 100%, unknown if currently breastfeeding.  Physical Exam:  General: alert, cooperative, and no distress Lochia: appropriate Uterine Fundus: firm Incision: n/a DVT Evaluation: No evidence of DVT seen on physical exam. Negative Homan's sign.  Recent Labs    07/09/23 0737 07/10/23 0519  HGB 12.1 10.9*  HCT 36.2 33.2*    Assessment/Plan: Plan for discharge tomorrow and Breastfeeding   LOS: 1 day   Levie Heritage, DO 07/10/2023, 9:49 AM

## 2023-07-10 NOTE — Lactation Note (Signed)
This note was copied from a baby's chart. Lactation Consultation Note Experienced BF mom states this baby is BF great w/no problems and no painful latches. Mom has watched BF videos for newborn refresher and feels good about BF at this time. Mom encouraged to feed baby 8-12 times/24 hours and with feeding cues.  Encouraged mom to call for questions or concerns before d/c home. Looking forward to going home tomorrow.   Patient Name: Tonya Hart [id number] Date: 07/10/2023 Age:3 hours Reason for consult: Initial assessment;Term   Maternal Data Does the patient have breastfeeding experience prior to this delivery?: Yes How long did the patient breastfeed?: 18 months. stopped BF 10 months ago.  Feeding    LATCH Score Latch: Grasps breast easily, tongue down, lips flanged, rhythmical sucking.  Audible Swallowing: Spontaneous and intermittent  Type of Nipple: Everted at rest and after stimulation  Comfort (Breast/Nipple): Soft / non-tender  Hold (Positioning): No assistance needed to correctly position infant at breast.  LATCH Score: 10   Lactation Tools Discussed/Used    Interventions Interventions: Breast feeding basics reviewed;LC Services brochure  Discharge    Consult Status Consult Status: Complete    Oneda Duffett G 07/10/2023, 9:58 PM

## 2023-07-11 MED ORDER — IBUPROFEN 600 MG PO TABS: 600.0000 mg | ORAL_TABLET | Freq: Four times a day (QID) | ORAL | 0 refills | Status: DC | PRN

## 2023-07-11 MED ORDER — OXYCODONE-ACETAMINOPHEN 5-325 MG PO TABS: 1.0000 | ORAL_TABLET | Freq: Four times a day (QID) | ORAL | 0 refills | Status: DC | PRN

## 2023-07-11 MED ORDER — DOCUSATE SODIUM 100 MG PO CAPS: 100.0000 mg | ORAL_CAPSULE | Freq: Two times a day (BID) | ORAL | 0 refills | Status: AC

## 2023-07-11 MED ORDER — DOCUSATE SODIUM 100 MG PO CAPS: 100.0000 mg | ORAL_CAPSULE | Freq: Two times a day (BID) | ORAL | 0 refills | Status: DC

## 2023-07-11 NOTE — Discharge Instructions (Addendum)
Continue to check your blood pressures twice a day Call the office for blood pressures that are consistently above 140 for the top number or 90 for the bottom number   Hypertension During Pregnancy Hypertension is also called high blood pressure. High blood pressure means that the force of your blood moving in your body is too strong. It can cause problems for you and your baby. Different types of high blood pressure can happen during pregnancy. The types are: High blood pressure before you got pregnant. This is called chronic hypertension.  This can continue during your pregnancy. Your doctor will want to keep checking your blood pressure. You may need medicine to keep your blood pressure under control while you are pregnant. You will need follow-up visits after you have your baby. High blood pressure that goes up during pregnancy when it was normal before. This is called gestational hypertension. It will usually get better after you have your baby, but your doctor will need to watch your blood pressure to make sure that it is getting better. Very high blood pressure during pregnancy. This is called preeclampsia. Very high blood pressure is an emergency that needs to be checked and treated right away. You may develop very high blood pressure after giving birth. This is called postpartum preeclampsia. This usually occurs within 48 hours after childbirth but may occur up to 6 weeks after giving birth. This is rare. How does this affect me? If you have high blood pressure during pregnancy, you have a higher chance of developing high blood pressure: As you get older. If you get pregnant again. In some cases, high blood pressure during pregnancy can cause: Stroke. Heart attack. Damage to the kidneys, lungs, or liver. Preeclampsia. Jerky movements you cannot control (convulsions or seizures). Problems with the placenta.  What can I do to lower my risk?  Keep a healthy weight. Eat a healthy  diet. Follow what your doctor tells you about treating any medical problems that you had before becoming pregnant. It is very important to go to all of your doctor visits. Your doctor will check your blood pressure and make sure that your pregnancy is progressing as it should. Treatment should start early if a problem is found.  Follow these instructions at home:  Take your blood pressure 1-2 times per day. Call the office if your blood pressure is 155 or higher for the top number or 105 or higher for the bottom number.    Eating and drinking  Drink enough fluid to keep your pee (urine) pale yellow. Avoid caffeine. Lifestyle Do not use any products that contain nicotine or tobacco, such as cigarettes, e-cigarettes, and chewing tobacco. If you need help quitting, ask your doctor. Do not use alcohol or drugs. Avoid stress. Rest and get plenty of sleep. Regular exercise can help. Ask your doctor what kinds of exercise are best for you. General instructions Take over-the-counter and prescription medicines only as told by your doctor. Keep all prenatal and follow-up visits as told by your doctor. This is important. Contact a doctor if: You have symptoms that your doctor told you to watch for, such as: Headaches. Nausea. Vomiting. Belly (abdominal) pain. Dizziness. Light-headedness. Get help right away if: You have: Very bad belly pain that does not get better with treatment. A very bad headache that does not get better. Vomiting that does not get better. Sudden, fast weight gain. Sudden swelling in your hands, ankles, or face. Blood in your pee. Blurry vision. Double vision.   Shortness of breath. Chest pain. Weakness on one side of your body. Trouble talking. Summary High blood pressure is also called hypertension. High blood pressure means that the force of your blood moving in your body is too strong. High blood pressure can cause problems for you and your baby. Keep all  follow-up visits as told by your doctor. This is important. This information is not intended to replace advice given to you by your health care provider. Make sure you discuss any questions you have with your health care provider. Document Released: 12/09/2010 Document Revised: 02/27/2019 Document Reviewed: 12/03/2018 Elsevier Patient Education  2020 Elsevier Inc. 

## 2023-07-12 ENCOUNTER — Encounter: Payer: Medicaid Other | Admitting: Obstetrics and Gynecology

## 2023-07-14 ENCOUNTER — Encounter (HOSPITAL_COMMUNITY): Payer: Self-pay

## 2023-07-14 ENCOUNTER — Inpatient Hospital Stay (HOSPITAL_COMMUNITY): Payer: Medicaid Other

## 2023-08-07 ENCOUNTER — Telehealth (HOSPITAL_COMMUNITY): Payer: Self-pay | Admitting: *Deleted

## 2023-08-07 NOTE — Telephone Encounter (Signed)
08/07/2023  Name: Janet Goelz MRN: [id number] DOB: 1990-12-29  Reason for Call:  Transition of Care Hospital Discharge Call  Contact Status: Patient Contact Status: Message  Language assistant needed:          Follow-Up Questions:    Inocente Salles Postnatal Depression Scale:  In the Past 7 Days:    PHQ2-9 Depression Scale:     Discharge Follow-up:    Post-discharge interventions: NA  Salena Saner, RN 08/07/2023 13:23

## 2023-08-20 ENCOUNTER — Encounter: Payer: Self-pay | Admitting: Medical-Surgical

## 2023-08-20 ENCOUNTER — Encounter: Payer: Self-pay | Admitting: Obstetrics & Gynecology

## 2023-08-20 ENCOUNTER — Telehealth: Payer: Medicaid Other | Admitting: Obstetrics & Gynecology

## 2023-08-20 DIAGNOSIS — M5441 Lumbago with sciatica, right side: Secondary | ICD-10-CM

## 2023-08-20 DIAGNOSIS — N809 Endometriosis, unspecified: Secondary | ICD-10-CM

## 2023-08-20 DIAGNOSIS — M5442 Lumbago with sciatica, left side: Secondary | ICD-10-CM | POA: Diagnosis not present

## 2023-08-20 MED ORDER — NORETHINDRONE 0.35 MG PO TABS: 1.0000 | ORAL_TABLET | Freq: Every day | ORAL | 11 refills | Status: DC

## 2023-08-20 MED ORDER — OXYCODONE-ACETAMINOPHEN 5-325 MG PO TABS: 1.0000 | ORAL_TABLET | ORAL | 0 refills | Status: DC | PRN
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
## 2023-08-20 NOTE — Progress Notes (Signed)
Pt c/o intense back pain

## 2023-08-27 ENCOUNTER — Encounter: Payer: Medicaid Other | Admitting: Sports Medicine

## 2023-09-10 ENCOUNTER — Ambulatory Visit: Payer: Medicaid Other | Admitting: Sports Medicine

## 2023-09-10 ENCOUNTER — Encounter: Payer: Self-pay | Admitting: Sports Medicine

## 2023-09-10 DIAGNOSIS — F1290 Cannabis use, unspecified, uncomplicated: Secondary | ICD-10-CM | POA: Diagnosis not present

## 2023-09-10 DIAGNOSIS — M51369 Other intervertebral disc degeneration, lumbar region without mention of lumbar back pain or lower extremity pain: Secondary | ICD-10-CM | POA: Insufficient documentation

## 2023-09-10 DIAGNOSIS — M47816 Spondylosis without myelopathy or radiculopathy, lumbar region: Secondary | ICD-10-CM | POA: Insufficient documentation

## 2023-09-10 DIAGNOSIS — M5136 Other intervertebral disc degeneration, lumbar region with discogenic back pain only: Secondary | ICD-10-CM

## 2023-09-10 DIAGNOSIS — M797 Fibromyalgia: Secondary | ICD-10-CM | POA: Diagnosis not present

## 2023-09-10 MED ORDER — PREDNISONE 50 MG PO TABS: ORAL_TABLET | ORAL | 0 refills | Status: DC

## 2023-09-10 MED ORDER — MELOXICAM 15 MG PO TABS: ORAL_TABLET | ORAL | 3 refills | Status: DC

## 2023-09-10 NOTE — Patient Instructions (Signed)
We will be using "[facility]" in [facility] [address] [place], [place], [place] [address] Closes 7?PM Phone: [phone]

## 2023-09-10 NOTE — Assessment & Plan Note (Signed)
Tonya Hart also has a diagnosis of fibromyalgia, as below she has been on some neuropathic agents including gabapentin, Lyrica, tricyclic antidepressants. These were intolerable due to excessive sedation. She does smoke marijuana approximately twice daily. She has never been on an SNRI. On exam she does have the expected hyperalgesia and allodynia to light palpation across her shoulder girdle, hip girdle, and paraspinal muscles. We will focus treatment initially on her lumbar spine, the physical therapy will also be beneficial for her fibromyalgia, however in the future we can certainly consider switching from her Lexapro to an SNRI such as Cymbalta. Trigger point injections can also be effective for pain relief.

## 2023-09-10 NOTE — Assessment & Plan Note (Addendum)
Currently smoking marijuana approximately 2 times daily for relief of her back pain. Cautioned about marijuana use and breast-feeding as well as marijuana use in proximity to children.

## 2023-09-10 NOTE — Assessment & Plan Note (Addendum)
This is a pleasant 32 year old female, she is approximately 2 months postpartum, she has a long history of axial low back pain. She has seen multiple other providers for this including orthopedic surgery, Dr. August Saucer as well as Dr. Wynn Banker with pain management/physiatry. She has had about 2 weeks of physical therapy but not more than this. She did not respond well to gabapentin or Lyrica and had intolerable sedation and brain fog. She did not respond to tricyclic antidepressants. She has never had epidurals or steroids per her report. She does smoke marijuana approximately twice a day to relieve her back pain. She describes her pain predominates axial, running up both paralumbar muscles. She was prescribed Percocet by her obstetrician that seems to help at night. No red flag symptoms. I explained to her the anatomy and evolutionary anthropology of lumbar disc disease, I explained the importance of physical therapy for greater than 6 weeks and that 2 weeks was to shorter duration and that is likely why it did not help. We will give her a burst of steroids along the way to help facilitate her working harder in physical therapy, she is aware that it could potentially decrease breastmilk production and she will talk to her obstetrician about that, and potentially consider Reglan. She can then do meloxicam afterwards. If insufficient improvement after a solid 8 weeks of physical therapy, meloxicam, with we will consider epidurals.

## 2023-09-10 NOTE — Progress Notes (Signed)
    Procedures performed today:    None.  Independent interpretation of notes and tests performed by another provider:   I did personally review the lumbar spine MRI, it does show desiccation and protrusion at the L5-S1 level without obvious neuroforaminal stenosis.  This could certainly be a cause of her axial back pain.  Brief History, Exam, Impression, and Recommendations:    Lumbar degenerative disc disease This is a pleasant 32 year old female, she is approximately 2 months postpartum, she has a long history of axial low back pain. She has seen multiple other providers for this including orthopedic surgery, Dr. August Saucer as well as Dr. Wynn Banker with pain management/physiatry. She has had about 2 weeks of physical therapy but not more than this. She did not respond well to gabapentin or Lyrica and had intolerable sedation and brain fog. She did not respond to tricyclic antidepressants. She has never had epidurals or steroids per her report. She does smoke marijuana approximately twice a day to relieve her back pain. She describes her pain predominates axial, running up both paralumbar muscles. She was prescribed Percocet by her obstetrician that seems to help at night. No red flag symptoms. I explained to her the anatomy and evolutionary anthropology of lumbar disc disease, I explained the importance of physical therapy for greater than 6 weeks and that 2 weeks was to shorter duration and that is likely why it did not help. We will give her a burst of steroids along the way to help facilitate her working harder in physical therapy, she is aware that it could potentially decrease breastmilk production and she will talk to her obstetrician about that, and potentially consider Reglan. She can then do meloxicam afterwards. If insufficient improvement after a solid 8 weeks of physical therapy, meloxicam, with we will consider epidurals.  Fibromyalgia Ladonne also has a diagnosis of  fibromyalgia, as below she has been on some neuropathic agents including gabapentin, Lyrica, tricyclic antidepressants. These were intolerable due to excessive sedation. She does smoke marijuana approximately twice daily. She has never been on an SNRI. On exam she does have the expected hyperalgesia and allodynia to light palpation across her shoulder girdle, hip girdle, and paraspinal muscles. We will focus treatment initially on her lumbar spine, the physical therapy will also be beneficial for her fibromyalgia, however in the future we can certainly consider switching from her Lexapro to an SNRI such as Cymbalta. Trigger point injections can also be effective for pain relief.  Marijuana use Currently smoking marijuana approximately 2 times daily for relief of her back pain. Cautioned about marijuana use and breast-feeding as well as marijuana use in proximity to children.  I spent 40 minutes of total time managing this patient today, this includes chart review, face to face, and non-face to face time.  ____________________________________________ Ihor Austin. Benjamin Stain, M.D., ABFM., CAQSM., AME. Primary Care and Sports Medicine [facility]  Adjunct Professor of Family Medicine  [place] [employer]  Restaurant manager, fast food

## 2023-09-30 ENCOUNTER — Encounter: Payer: Self-pay | Admitting: Medical-Surgical

## 2023-09-30 ENCOUNTER — Other Ambulatory Visit: Payer: Self-pay | Admitting: Obstetrics & Gynecology

## 2023-10-01 DIAGNOSIS — M549 Dorsalgia, unspecified: Secondary | ICD-10-CM | POA: Diagnosis not present

## 2023-10-01 DIAGNOSIS — G8929 Other chronic pain: Secondary | ICD-10-CM | POA: Diagnosis not present

## 2023-10-01 DIAGNOSIS — M797 Fibromyalgia: Secondary | ICD-10-CM | POA: Diagnosis not present

## 2023-10-04 ENCOUNTER — Encounter: Payer: Self-pay | Admitting: Obstetrics and Gynecology

## 2023-10-04 ENCOUNTER — Ambulatory Visit: Payer: Medicaid Other | Admitting: Obstetrics and Gynecology

## 2023-10-04 VITALS — BP 129/73 | HR 114 | Ht 69.0 in | Wt 201.0 lb

## 2023-10-04 DIAGNOSIS — R102 Pelvic and perineal pain: Secondary | ICD-10-CM

## 2023-10-04 DIAGNOSIS — G8929 Other chronic pain: Secondary | ICD-10-CM | POA: Diagnosis not present

## 2023-10-04 DIAGNOSIS — Z302 Encounter for sterilization: Secondary | ICD-10-CM

## 2023-10-04 NOTE — Progress Notes (Signed)
RETURN GYNECOLOGY VISIT  Subjective:  Tonya Hart is a 32 y.o. [id number] who is 12 weeks postpartum presenting for hysterectomy consult.  Reports history of PCOS and endometriosis. Endometriosis has not been diagnosed by laparoscopy. Reports longstanding irregular, severely painful periods that last 5-7 days. Pain is so back that she misses work. Has been like this since her teenage years. She has tried NSAIDs, heat, topical creams, and continuous COCs without relief in her period pain. Has some deep dyspareunia, some pain with BM, and dysuria with her period. She notes subfertility and that she had been having unprotected IC for a long time with her partner before conceiving. Only got pregnant after starting metformin for PCOS - periods did not become more regular. She has never tried PFPT. Her history is also complicated by fibromyalgia and chronic low back pain. She is following with sports medicine for her back pain and is planning at least 8 weeks of PT.   She is interested in hysterectomy because she no longer desires fertility.   I personally reviewed the following: - Note by Dr. Benjamin Stain 09/10/23 - Pap 12/20/22 NILM - Pelvic US 05/11/20 - increased R ovarian volume, b/l ovaries with numerous peripheral follicles c/f PCOS  Objective:   Vitals:   10/04/23 1303  BP: 129/73  Pulse: (!) 114  Weight: 201 lb (91.2 kg)  Height: 5\' 9"  (1.753 m)   General:  Alert, oriented and cooperative. Patient is in no acute distress.  Skin: Skin is warm and dry. No rash noted.   Cardiovascular: Normal heart rate noted  Respiratory: Normal respiratory effort, no problems with respiration noted  Abdomen: Soft, non-tender, non-distended   Pelvic: NEFG. Tenderness of bilateral levators and obturators (R>L) and abdominal musculature. No uterine/adnexal tenderness. No CDS tenderness.   Exam performed in the presence of a chaperone  Assessment and Plan:  Tonya Hart is a 32 y.o. with  chronic pelvic pain and undesired fertility  1. Request for sterilization 2. Chronic pelvic pain in female Permanent sterilization is patient's primary goal with surgery. She reports she is certain of her desire for permanent sterilization and declines alternatives of LARC or vasectomy. We specifically reviewed that these options are lower risk and LARC is reversible. She declines these options.   Discussed that bilateral salpingectomy is an option for permanent sterilization that is lower risk compared to hysterectomy. She has a significant myofascial component to her pain that has not been addressed and her endometriosis diagnosis has not been confirmed. I voiced my concern that if we do a hysterectomy when she does not have endometriosis/adenomyosis, there is a 60%+ chance that we do not improve her pelvic pain. We discussed that an alternative would be diagnostic laparoscopy, bilateral salpingectomy and possible excision of endometriosis to help her confirm her diagnosis while also providing permanent sterilization. Tonya Hart is in agreement with this plan.   We also discussed role for PFPT given the myofascial component of her pain. She is accepting of referral.  - Ambulatory referral to Physical Therapy  Diagnosis: request for permanent sterilization, chronic pelvic pain Surgery: diagnostic laparoscopy, bilateral salpingectomy, possible excision of endometriosis - Risks of surgery include but are not limited to: bleeding, infection, injury to surrounding organs/tissues (i.e. bowel/bladder/ureters), need for additional procedures, wound complications, hospital re-admission, and conversion to open surgery, VTE. We reviewed risk of contraceptive failure and risk of regret. Reviewed risk of exacerbating back pain after surgery.  - Reviewed restrictions and recovery following surgery - Medicaid sterilization consent  signed   Future Appointments  Date Time Provider Department Center  10/29/2023  10:30 AM Monica Becton, MD [facility] None   Lennart Pall, MD

## 2023-10-24 ENCOUNTER — Encounter: Payer: Self-pay | Admitting: Obstetrics and Gynecology

## 2023-10-24 ENCOUNTER — Telehealth: Payer: Self-pay

## 2023-10-24 NOTE — Telephone Encounter (Signed)
Called patient to let her know Dr. Berton Lan first available date for surgery is 12/04/23. Left voicemail requesting a call back to schedule.[phone].

## 2023-10-29 ENCOUNTER — Ambulatory Visit: Payer: Medicaid Other | Admitting: Sports Medicine

## 2023-10-29 DIAGNOSIS — M5136 Other intervertebral disc degeneration, lumbar region with discogenic back pain only: Secondary | ICD-10-CM

## 2023-10-29 DIAGNOSIS — M797 Fibromyalgia: Secondary | ICD-10-CM | POA: Diagnosis not present

## 2023-10-29 MED ORDER — PREDNISONE 50 MG PO TABS
ORAL_TABLET | ORAL | 0 refills | Status: DC
Start: 2023-10-29 — End: 2023-12-27

## 2023-10-29 MED ORDER — ESCITALOPRAM OXALATE 5 MG PO TABS: ORAL_TABLET | ORAL | 0 refills | Status: DC

## 2023-10-29 MED ORDER — DULOXETINE HCL 30 MG PO CPEP: 30.0000 mg | ORAL_CAPSULE | Freq: Every day | ORAL | 3 refills | Status: DC

## 2023-10-29 NOTE — Assessment & Plan Note (Signed)
Known fibromyalgia, inefficacy with multiple neuropathic agents including gabapentin, Lyrica, tricyclics. At this point we will switch from Lexapro to Cymbalta, we will do a down taper and Lexapro, and we will start with Cymbalta 30 with up titration's every 6 weeks as needed. Cymbalta is safe for breast-feeding.

## 2023-10-29 NOTE — Progress Notes (Signed)
    Procedures performed today:    None.  Independent interpretation of notes and tests performed by another provider:   None.  Brief History, Exam, Impression, and Recommendations:    Lumbar degenerative disc disease Pleasant 32 year old female, she is now approximately 3-1/2 months postpartum, long history of axial low back pain, she has seen multiple providers including orthopedic surgery, Dr. August Saucer as well as Dr. Wynn Banker with pain management She has had 2 weeks of formal PT and since then she has done home PT but has not been able to get back into formal physical therapy. She did not respond to gabapentin or Lyrica with intractable sedation and brain fog. Did not respond to tricyclic antidepressants. Has never had epidurals. Does smoke marijuana about twice a day to relieve the back pain. MRI does show L5-S1 DDD. Back pain was predominately axial running up with paralumbar muscles. We have discussed the anatomy and pathophysiology. We did a burst of steroids at the last visit and she did have some improvement. Unfortunately she threw out her back again trying to help her daughter do something. We will try another burst of steroids, she will continue with a home PT, and we will switch from Lexapro to Cymbalta. If insufficient improvement after the next 6 weeks we will proceed with lumbar epidural.  Fibromyalgia Known fibromyalgia, inefficacy with multiple neuropathic agents including gabapentin, Lyrica, tricyclics. At this point we will switch from Lexapro to Cymbalta, we will do a down taper and Lexapro, and we will start with Cymbalta 30 with up titration's every 6 weeks as needed. Cymbalta is safe for breast-feeding.    ____________________________________________ Ihor Austin. Benjamin Stain, M.D., ABFM., CAQSM., AME. Primary Care and Sports Medicine [facility]  Adjunct Professor of Family Medicine  [employer]  Restaurant manager, fast food

## 2023-10-29 NOTE — Assessment & Plan Note (Signed)
Pleasant 32 year old female, she is now approximately 3-1/2 months postpartum, long history of axial low back pain, she has seen multiple providers including orthopedic surgery, Dr. August Saucer as well as Dr. Wynn Banker with pain management She has had 2 weeks of formal PT and since then she has done home PT but has not been able to get back into formal physical therapy. She did not respond to gabapentin or Lyrica with intractable sedation and brain fog. Did not respond to tricyclic antidepressants. Has never had epidurals. Does smoke marijuana about twice a day to relieve the back pain. MRI does show L5-S1 DDD. Back pain was predominately axial running up with paralumbar muscles. We have discussed the anatomy and pathophysiology. We did a burst of steroids at the last visit and she did have some improvement. Unfortunately she threw out her back again trying to help her daughter do something. We will try another burst of steroids, she will continue with a home PT, and we will switch from Lexapro to Cymbalta. If insufficient improvement after the next 6 weeks we will proceed with lumbar epidural.

## 2023-11-12 ENCOUNTER — Other Ambulatory Visit: Payer: Self-pay | Admitting: Sports Medicine

## 2023-11-12 DIAGNOSIS — M797 Fibromyalgia: Secondary | ICD-10-CM

## 2023-11-15 ENCOUNTER — Telehealth: Payer: Self-pay

## 2023-11-15 NOTE — Telephone Encounter (Signed)
Patient called to have surgery scheduled w/ Dr. Berton Lan on 01/07/24 at [facility] @ 1 pm. Patient agreed to arrive by 11 am. Pre-op instructions provided over the phone. Written details will be sent to patients Mychart once surgery is finalized. Patient confirmed understanding.

## 2023-12-10 ENCOUNTER — Ambulatory Visit: Payer: Medicaid Other | Admitting: Sports Medicine

## 2023-12-10 ENCOUNTER — Other Ambulatory Visit: Payer: Self-pay | Admitting: Obstetrics and Gynecology

## 2023-12-10 DIAGNOSIS — Z302 Encounter for sterilization: Secondary | ICD-10-CM

## 2023-12-10 DIAGNOSIS — G8929 Other chronic pain: Secondary | ICD-10-CM

## 2024-01-01 ENCOUNTER — Other Ambulatory Visit: Payer: Self-pay

## 2024-01-01 ENCOUNTER — Encounter (HOSPITAL_COMMUNITY): Payer: Self-pay

## 2024-01-01 ENCOUNTER — Encounter (HOSPITAL_COMMUNITY)
Admission: RE | Admit: 2024-01-01 | Discharge: 2024-01-01 | Disposition: A | Discharge status: Home / Self Care | Payer: Medicaid Other | Source: Ambulatory Visit | Attending: Obstetrics and Gynecology | Admitting: Obstetrics and Gynecology

## 2024-01-01 VITALS — BP 115/79 | HR 84 | Temp 98.3°F | Resp 18 | Ht 68.0 in | Wt 202.9 lb

## 2024-01-01 DIAGNOSIS — R102 Pelvic and perineal pain: Secondary | ICD-10-CM | POA: Diagnosis not present

## 2024-01-01 DIAGNOSIS — Z302 Encounter for sterilization: Secondary | ICD-10-CM

## 2024-01-01 DIAGNOSIS — Z01818 Encounter for other preprocedural examination: Secondary | ICD-10-CM | POA: Diagnosis present

## 2024-01-01 DIAGNOSIS — G8929 Other chronic pain: Secondary | ICD-10-CM | POA: Diagnosis not present

## 2024-01-01 DIAGNOSIS — Z01812 Encounter for preprocedural laboratory examination: Secondary | ICD-10-CM | POA: Insufficient documentation

## 2024-01-01 HISTORY — DX: Fibromyalgia: M79.7

## 2024-01-01 HISTORY — DX: Other complications of anesthesia, initial encounter: T88.59XA

## 2024-01-01 HISTORY — DX: Nausea with vomiting, unspecified: R11.2

## 2024-01-01 HISTORY — DX: Other specified postprocedural states: Z98.890

## 2024-01-01 HISTORY — DX: Headache, unspecified: R51.9

## 2024-01-01 LAB — BASIC METABOLIC PANEL
Anion gap: 8 (ref 5–15)
BUN: 11 mg/dL (ref 6–20)
CO2: 25 mmol/L (ref 22–32)
Calcium: 9.6 mg/dL (ref 8.9–10.3)
Chloride: 106 mmol/L (ref 98–111)
Creatinine, Ser: 0.72 mg/dL (ref 0.44–1.00)
GFR, Estimated: >60 mL/min (ref >60)
Glucose, Bld: 93 mg/dL (ref 70–99)
Potassium: 4.6 mmol/L (ref 3.5–5.1)
Sodium: 139 mmol/L (ref 135–145)

## 2024-01-01 LAB — CBC
HCT: 38.9 % (ref 36.0–46.0)
Hemoglobin: 13 g/dL (ref 12.0–15.0)
MCH: 32 pg (ref 26.0–34.0)
MCHC: 33.4 g/dL (ref 30.0–36.0)
MCV: 95.8 fL (ref 80.0–100.0)
Platelets: 219 10*3/uL (ref 150–400)
RBC: 4.06 MIL/uL (ref 3.87–5.11)
RDW: 12.5 % (ref 11.5–15.5)
WBC: 5.8 10*3/uL (ref 4.0–10.5)
nRBC: 0 % (ref 0.0–0.2)

## 2024-01-01 LAB — TYPE AND SCREEN
ABO/RH(D): O POS
Antibody Screen: NEGATIVE

## 2024-01-01 NOTE — Progress Notes (Signed)
PCP - Christen Butter, NP Cardiologist - Denies  PPM/ICD - Denies Device Orders - n/a Rep Notified - n/a  Chest x-ray - n/a EKG - Denies Stress Test - Denies ECHO - Denies Cardiac Cath - Denies  Sleep Study - Denies CPAP - n/a  No DM  Last dose of GLP1 agonist- n/a GLP1 instructions: n/a  Blood Thinner Instructions: n/a Aspirin Instructions: n/a  NPO after midnight  COVID TEST- n/a   Anesthesia review: No.   Patient denies shortness of breath, fever, cough and chest pain at PAT appointment. Pt denies any respiratory illness/infection in the last two months. Pt does note +COVID around Christmas last year and has had symptom resolution for over one month.   All instructions explained to the patient, with a verbal understanding of the material. Patient agrees to go over the instructions while at home for a better understanding. Patient also instructed to self quarantine after being tested for COVID-19. The opportunity to ask questions was provided.

## 2024-01-06 NOTE — Anesthesia Preprocedure Evaluation (Signed)
Anesthesia Evaluation  Patient identified by MRN, date of birth, ID band Patient awake    Reviewed: Allergy & Precautions, NPO status , Patient's Chart, lab work & pertinent test results  History of Anesthesia Complications (+) PONV  Airway Mallampati: II  TM Distance: >3 FB Neck ROM: Full    Dental  (+) Dental Advisory Given   Pulmonary Patient abstained from smoking., former smoker   breath sounds clear to auscultation       Cardiovascular (-) angina negative cardio ROS  Rhythm:Regular Rate:Normal  Gestational HTN 3 years ago, no longer requires meds   Neuro/Psych  Headaches  Anxiety Depression       GI/Hepatic negative GI ROS, Neg liver ROS,,,  Endo/Other  PCOS BMI 30.8  Renal/GU negative Renal ROS     Musculoskeletal  (+)  Fibromyalgia -  Abdominal   Peds  Hematology negative hematology ROS (+)   Anesthesia Other Findings   Reproductive/Obstetrics (+) Breast feeding                              Anesthesia Physical Anesthesia Plan  ASA: 2  Anesthesia Plan: General   Post-op Pain Management: Tylenol PO (pre-op)*   Induction: Intravenous  PONV Risk Score and Plan: 4 or greater and Scopolamine patch - Pre-op, Dexamethasone, Ondansetron and TIVA  Airway Management Planned: Oral ETT  Additional Equipment: None  Intra-op Plan:   Post-operative Plan: Extubation in OR  Informed Consent: I have reviewed the patients History and Physical, chart, labs and discussed the procedure including the risks, benefits and alternatives for the proposed anesthesia with the patient or authorized representative who has indicated his/her understanding and acceptance.     Dental advisory given  Plan Discussed with: CRNA and Surgeon  Anesthesia Plan Comments: (Pt understands does not need to pump and dump breast milk, yet she prefers to do so)        Anesthesia Quick Evaluation

## 2024-01-07 ENCOUNTER — Encounter (HOSPITAL_COMMUNITY)
Admission: RE | Disposition: A | Discharge status: Home / Self Care | Payer: Self-pay | Source: Ambulatory Visit | Attending: Obstetrics and Gynecology

## 2024-01-07 ENCOUNTER — Ambulatory Visit (HOSPITAL_BASED_OUTPATIENT_CLINIC_OR_DEPARTMENT_OTHER): Payer: Self-pay | Admitting: Physician Assistant

## 2024-01-07 ENCOUNTER — Ambulatory Visit (HOSPITAL_COMMUNITY): Payer: Self-pay | Admitting: Physician Assistant

## 2024-01-07 ENCOUNTER — Other Ambulatory Visit: Payer: Self-pay

## 2024-01-07 ENCOUNTER — Ambulatory Visit (HOSPITAL_COMMUNITY)
Admission: RE | Admit: 2024-01-07 | Discharge: 2024-01-07 | Disposition: A | Discharge status: Home / Self Care | Payer: Medicaid Other | Source: Ambulatory Visit | Attending: Obstetrics and Gynecology | Admitting: Obstetrics and Gynecology

## 2024-01-07 ENCOUNTER — Encounter (HOSPITAL_COMMUNITY): Payer: Self-pay | Admitting: Obstetrics and Gynecology

## 2024-01-07 DIAGNOSIS — E282 Polycystic ovarian syndrome: Secondary | ICD-10-CM | POA: Insufficient documentation

## 2024-01-07 DIAGNOSIS — Z302 Encounter for sterilization: Secondary | ICD-10-CM | POA: Insufficient documentation

## 2024-01-07 DIAGNOSIS — J45909 Unspecified asthma, uncomplicated: Secondary | ICD-10-CM | POA: Insufficient documentation

## 2024-01-07 DIAGNOSIS — G8929 Other chronic pain: Secondary | ICD-10-CM | POA: Insufficient documentation

## 2024-01-07 DIAGNOSIS — Z87891 Personal history of nicotine dependence: Secondary | ICD-10-CM | POA: Diagnosis not present

## 2024-01-07 DIAGNOSIS — Z640 Problems related to unwanted pregnancy: Secondary | ICD-10-CM | POA: Diagnosis not present

## 2024-01-07 DIAGNOSIS — K660 Peritoneal adhesions (postprocedural) (postinfection): Secondary | ICD-10-CM | POA: Diagnosis not present

## 2024-01-07 DIAGNOSIS — M797 Fibromyalgia: Secondary | ICD-10-CM | POA: Diagnosis not present

## 2024-01-07 DIAGNOSIS — R102 Pelvic and perineal pain: Secondary | ICD-10-CM | POA: Insufficient documentation

## 2024-01-07 HISTORY — PX: LAPAROSCOPIC BILATERAL SALPINGECTOMY: SHX5889

## 2024-01-07 LAB — POCT PREGNANCY, URINE: Preg Test, Ur: NEGATIVE

## 2024-01-07 SURGERY — SALPINGECTOMY, BILATERAL, LAPAROSCOPIC
Anesthesia: General | Site: Abdomen | Laterality: Bilateral

## 2024-01-07 MED ORDER — SILVER NITRATE-POT NITRATE 75-25 % EX MISC
CUTANEOUS | Status: DC | PRN
  Administered 2024-01-07: 2

## 2024-01-07 MED ORDER — DEXMEDETOMIDINE HCL IN NACL 80 MCG/20ML IV SOLN
INTRAVENOUS | Status: AC
  Filled 2024-01-07: qty 20

## 2024-01-07 MED ORDER — MIDAZOLAM HCL 5 MG/5ML IJ SOLN
INTRAMUSCULAR | Status: DC | PRN
  Administered 2024-01-07: 2 mg via INTRAVENOUS

## 2024-01-07 MED ORDER — ROCURONIUM BROMIDE 10 MG/ML (PF) SYRINGE
PREFILLED_SYRINGE | INTRAVENOUS | Status: DC | PRN
  Administered 2024-01-07: 60 mg via INTRAVENOUS
  Administered 2024-01-07: 10 mg via INTRAVENOUS

## 2024-01-07 MED ORDER — KETOROLAC TROMETHAMINE 15 MG/ML IJ SOLN
INTRAMUSCULAR | Status: AC
  Administered 2024-01-07: 15 mg via INTRAVENOUS
  Filled 2024-01-07: qty 1

## 2024-01-07 MED ORDER — SCOPOLAMINE 1 MG/3DAYS TD PT72
1.0000 | MEDICATED_PATCH | TRANSDERMAL | Status: DC
  Administered 2024-01-07: 1.5 mg via TRANSDERMAL
  Filled 2024-01-07: qty 1

## 2024-01-07 MED ORDER — MEPERIDINE HCL 25 MG/ML IJ SOLN: 6.2500 mg | INTRAMUSCULAR | Status: DC | PRN

## 2024-01-07 MED ORDER — KETOROLAC TROMETHAMINE 15 MG/ML IJ SOLN: 15.0000 mg | Freq: Once | INTRAMUSCULAR | Status: AC

## 2024-01-07 MED ORDER — DEXAMETHASONE SODIUM PHOSPHATE 10 MG/ML IJ SOLN
INTRAMUSCULAR | Status: DC | PRN
  Administered 2024-01-07: 10 mg via INTRAVENOUS

## 2024-01-07 MED ORDER — GLYCOPYRROLATE PF 0.2 MG/ML IJ SOSY
PREFILLED_SYRINGE | INTRAMUSCULAR | Status: DC | PRN
  Administered 2024-01-07: .2 mg via INTRAVENOUS

## 2024-01-07 MED ORDER — 0.9 % SODIUM CHLORIDE (POUR BTL) OPTIME
TOPICAL | Status: DC | PRN
  Administered 2024-01-07: 1000 mL

## 2024-01-07 MED ORDER — LIDOCAINE 2% (20 MG/ML) 5 ML SYRINGE
INTRAMUSCULAR | Status: DC | PRN
  Administered 2024-01-07: 20 mg via INTRAVENOUS

## 2024-01-07 MED ORDER — OXYCODONE HCL 5 MG PO TABS
5.0000 mg | ORAL_TABLET | Freq: Once | ORAL | Status: AC | PRN
  Administered 2024-01-07: 5 mg via ORAL

## 2024-01-07 MED ORDER — OXYCODONE HCL 5 MG PO TABS
ORAL_TABLET | ORAL | Status: DC
Start: 2024-01-07 — End: 2024-01-07
  Filled 2024-01-07: qty 1

## 2024-01-07 MED ORDER — OXYCODONE HCL 5 MG PO TABS: 5.0000 mg | ORAL_TABLET | ORAL | 0 refills | Status: DC | PRN

## 2024-01-07 MED ORDER — CELECOXIB 200 MG PO CAPS
400.0000 mg | ORAL_CAPSULE | ORAL | Status: AC
Start: 2024-01-07 — End: 2024-01-07
  Administered 2024-01-07: 400 mg via ORAL
  Filled 2024-01-07: qty 2

## 2024-01-07 MED ORDER — AMISULPRIDE (ANTIEMETIC) 5 MG/2ML IV SOLN: 10.0000 mg | Freq: Once | INTRAVENOUS | Status: AC

## 2024-01-07 MED ORDER — LACTATED RINGERS IV SOLN: INTRAVENOUS | Status: DC

## 2024-01-07 MED ORDER — GLYCOPYRROLATE PF 0.2 MG/ML IJ SOSY
PREFILLED_SYRINGE | INTRAMUSCULAR | Status: AC
  Filled 2024-01-07: qty 1

## 2024-01-07 MED ORDER — HYDROMORPHONE HCL 1 MG/ML IJ SOLN
0.2500 mg | INTRAMUSCULAR | Status: DC | PRN
  Administered 2024-01-07: 0.25 mg via INTRAVENOUS

## 2024-01-07 MED ORDER — ACETAMINOPHEN 500 MG PO TABS
1000.0000 mg | ORAL_TABLET | Freq: Once | ORAL | Status: AC
  Administered 2024-01-07: 1000 mg via ORAL
  Filled 2024-01-07: qty 2

## 2024-01-07 MED ORDER — SILVER NITRATE-POT NITRATE 75-25 % EX MISC
CUTANEOUS | Status: AC
Start: 2024-01-07 — End: ?
  Filled 2024-01-07: qty 10

## 2024-01-07 MED ORDER — LIDOCAINE 2% (20 MG/ML) 5 ML SYRINGE
INTRAMUSCULAR | Status: AC
  Filled 2024-01-07: qty 5

## 2024-01-07 MED ORDER — ROCURONIUM BROMIDE 10 MG/ML (PF) SYRINGE
PREFILLED_SYRINGE | INTRAVENOUS | Status: AC
  Filled 2024-01-07: qty 10

## 2024-01-07 MED ORDER — DEXAMETHASONE SODIUM PHOSPHATE 10 MG/ML IJ SOLN
INTRAMUSCULAR | Status: AC
  Filled 2024-01-07: qty 1

## 2024-01-07 MED ORDER — FENTANYL CITRATE (PF) 100 MCG/2ML IJ SOLN
INTRAMUSCULAR | Status: DC | PRN
  Administered 2024-01-07: 50 ug via INTRAVENOUS
  Administered 2024-01-07: 100 ug via INTRAVENOUS
  Administered 2024-01-07: 50 ug via INTRAVENOUS

## 2024-01-07 MED ORDER — DEXMEDETOMIDINE HCL IN NACL 80 MCG/20ML IV SOLN
INTRAVENOUS | Status: DC | PRN
  Administered 2024-01-07: 12 ug via INTRAVENOUS
  Administered 2024-01-07: 4 ug via INTRAVENOUS
  Administered 2024-01-07: 8 ug via INTRAVENOUS

## 2024-01-07 MED ORDER — HYDROMORPHONE HCL 1 MG/ML IJ SOLN
INTRAMUSCULAR | Status: AC
  Administered 2024-01-07: 0.25 mg via INTRAVENOUS
  Filled 2024-01-07: qty 1

## 2024-01-07 MED ORDER — ONDANSETRON HCL 4 MG/2ML IJ SOLN
INTRAMUSCULAR | Status: DC | PRN
  Administered 2024-01-07: 4 mg via INTRAVENOUS

## 2024-01-07 MED ORDER — ORAL CARE MOUTH RINSE: 15.0000 mL | Freq: Once | OROMUCOSAL | Status: AC

## 2024-01-07 MED ORDER — ACETAMINOPHEN 500 MG PO TABS: 1000.0000 mg | ORAL_TABLET | Freq: Four times a day (QID) | ORAL | 0 refills | Status: DC

## 2024-01-07 MED ORDER — OXYCODONE HCL 5 MG/5ML PO SOLN: 5.0000 mg | Freq: Once | ORAL | Status: AC | PRN

## 2024-01-07 MED ORDER — SUGAMMADEX SODIUM 200 MG/2ML IV SOLN
INTRAVENOUS | Status: AC
  Filled 2024-01-07: qty 2

## 2024-01-07 MED ORDER — PHENYLEPHRINE 80 MCG/ML (10ML) SYRINGE FOR IV PUSH (FOR BLOOD PRESSURE SUPPORT)
PREFILLED_SYRINGE | INTRAVENOUS | Status: AC
  Filled 2024-01-07: qty 10

## 2024-01-07 MED ORDER — ONDANSETRON HCL 4 MG/2ML IJ SOLN
INTRAMUSCULAR | Status: AC
  Filled 2024-01-07: qty 2

## 2024-01-07 MED ORDER — KETOROLAC TROMETHAMINE 30 MG/ML IJ SOLN
INTRAMUSCULAR | Status: AC
  Filled 2024-01-07: qty 1

## 2024-01-07 MED ORDER — FENTANYL CITRATE (PF) 250 MCG/5ML IJ SOLN
INTRAMUSCULAR | Status: AC
  Filled 2024-01-07: qty 5

## 2024-01-07 MED ORDER — MIDAZOLAM HCL 2 MG/2ML IJ SOLN
INTRAMUSCULAR | Status: AC
  Filled 2024-01-07: qty 2

## 2024-01-07 MED ORDER — MIDAZOLAM HCL 2 MG/2ML IJ SOLN: 0.5000 mg | Freq: Once | INTRAMUSCULAR | Status: DC | PRN

## 2024-01-07 MED ORDER — SUGAMMADEX SODIUM 200 MG/2ML IV SOLN
INTRAVENOUS | Status: DC | PRN
  Administered 2024-01-07: 200 mg via INTRAVENOUS

## 2024-01-07 MED ORDER — CHLORHEXIDINE GLUCONATE 0.12 % MT SOLN
15.0000 mL | Freq: Once | OROMUCOSAL | Status: AC
  Administered 2024-01-07: 15 mL via OROMUCOSAL
  Filled 2024-01-07: qty 15

## 2024-01-07 MED ORDER — PHENYLEPHRINE 80 MCG/ML (10ML) SYRINGE FOR IV PUSH (FOR BLOOD PRESSURE SUPPORT)
PREFILLED_SYRINGE | INTRAVENOUS | Status: DC | PRN
  Administered 2024-01-07 (×2): 80 ug via INTRAVENOUS

## 2024-01-07 MED ORDER — IBUPROFEN 800 MG PO TABS: 800.0000 mg | ORAL_TABLET | Freq: Three times a day (TID) | ORAL | 0 refills | Status: AC

## 2024-01-07 MED ORDER — PROPOFOL 10 MG/ML IV BOLUS
INTRAVENOUS | Status: DC | PRN
Start: 2024-01-07 — End: 2024-01-07
  Administered 2024-01-07: 200 mg via INTRAVENOUS

## 2024-01-07 MED ORDER — PROPOFOL 10 MG/ML IV BOLUS
INTRAVENOUS | Status: AC
  Filled 2024-01-07: qty 20

## 2024-01-07 MED ORDER — ACETAMINOPHEN 500 MG PO TABS: 1000.0000 mg | ORAL_TABLET | ORAL | Status: DC

## 2024-01-07 MED ORDER — SENNOSIDES-DOCUSATE SODIUM 8.6-50 MG PO TABS: 2.0000 | ORAL_TABLET | Freq: Every day | ORAL | 0 refills | Status: DC

## 2024-01-07 MED ORDER — AMISULPRIDE (ANTIEMETIC) 5 MG/2ML IV SOLN
INTRAVENOUS | Status: AC
  Administered 2024-01-07: 10 mg via INTRAVENOUS
  Filled 2024-01-07: qty 2

## 2024-01-07 MED ORDER — ONDANSETRON 4 MG PO TBDP: 4.0000 mg | ORAL_TABLET | Freq: Four times a day (QID) | ORAL | 0 refills | Status: DC | PRN

## 2024-01-07 SURGICAL SUPPLY — 34 items
APPLICATOR ARISTA FLEXITIP XL (MISCELLANEOUS) IMPLANT
BAG COUNTER SPONGE SURGICOUNT (BAG) ×2 IMPLANT
BLADE SURG 11 STRL SS (BLADE) ×2 IMPLANT
DERMABOND ADVANCED .7 DNX12 (GAUZE/BANDAGES/DRESSINGS) ×2 IMPLANT
DURAPREP 26ML APPLICATOR (WOUND CARE) ×2 IMPLANT
ELECT REM PT RETURN 9FT ADLT (ELECTROSURGICAL) ×2 IMPLANT
ELECTRODE REM PT RTRN 9FT ADLT (ELECTROSURGICAL) ×1 IMPLANT
GLOVE BIO SURGEON STRL SZ7 (GLOVE) ×2 IMPLANT
GLOVE BIOGEL PI IND STRL 7.0 (GLOVE) ×8 IMPLANT
GOWN STRL REUS W/ TWL LRG LVL3 (GOWN DISPOSABLE) ×2 IMPLANT
GOWN STRL REUS W/ TWL XL LVL3 (GOWN DISPOSABLE) ×2 IMPLANT
HEMOSTAT ARISTA ABSORB 3G PWDR (HEMOSTASIS) ×1 IMPLANT
IRRIG SUCT STRYKERFLOW 2 WTIP (MISCELLANEOUS) IMPLANT
IRRIGATION SUCT STRKRFLW 2 WTP (MISCELLANEOUS) IMPLANT
KIT PINK PAD W/HEAD ARE REST (MISCELLANEOUS) ×2 IMPLANT
KIT PINK PAD W/HEAD ARM REST (MISCELLANEOUS) ×1 IMPLANT
KIT TURNOVER KIT B (KITS) ×2 IMPLANT
MANIFOLD NEPTUNE II (INSTRUMENTS) IMPLANT
PACK LAPAROSCOPY BASIN (CUSTOM PROCEDURE TRAY) ×2 IMPLANT
PROTECTOR NERVE ULNAR (MISCELLANEOUS) ×4 IMPLANT
SEALER TISSUE G2 CVD JAW 35 (ENDOMECHANICALS) ×2 IMPLANT
SET TUBE SMOKE EVAC HIGH FLOW (TUBING) ×2 IMPLANT
SLEEVE Z-THREAD 5X100MM (TROCAR) ×3 IMPLANT
SUT VIC AB 4-0 PS2 18 (SUTURE) ×1 IMPLANT
SUT VICRYL 0 UR6 27IN ABS (SUTURE) ×2 IMPLANT
SUT VICRYL 4-0 PS2 18IN ABS (SUTURE) ×2 IMPLANT
SYS BAG RETRIEVAL 10MM (BASKET) IMPLANT
SYSTEM BAG RETRIEVAL 10MM (BASKET) IMPLANT
TOWEL GREEN STERILE FF (TOWEL DISPOSABLE) ×4 IMPLANT
TRAY FOLEY W/BAG SLVR 14FR (SET/KITS/TRAYS/PACK) ×2 IMPLANT
TROCAR 11X100 Z THREAD (TROCAR) IMPLANT
TROCAR BALLN 12MMX100 BLUNT (TROCAR) ×2 IMPLANT
TROCAR Z-THREAD OPTICAL 5X100M (TROCAR) ×2 IMPLANT
WARMER LAPAROSCOPE (MISCELLANEOUS) ×2 IMPLANT

## 2024-01-07 NOTE — Progress Notes (Signed)
Dilaudid .5mg  wasted in stericycle. Witnessed by Mortimer Fries RN

## 2024-01-07 NOTE — Discharge Instructions (Addendum)
Post-surgical Instructions, Outpatient Surgery  You may expect to feel dizzy, weak, and drowsy for as long as 24 hours after receiving the medicine that made you sleep (anesthetic). For the first 24 hours after your surgery:   Do not drive a car, ride a bicycle, participate in physical activities, or take public transportation until you are done taking narcotic pain medicines  Do not drink alcohol or take tranquilizers.  Do not take medicine that has not been prescribed by your physicians.  Do not sign important papers or make important decisions while on narcotic pain medicines.  Have a responsible person with you.   CARE OF INCISION You have dermabond (surgical glue) on your incisions. It will flake off like a scab. Just let this loosen on its own.  You may shower on the first day after your surgery.  Do not sit in a tub bath for four weeks. Avoid heavy lifting (more than 10 pounds/4.5 kilograms), pushing, or pulling.  Avoid activities that may risk injury to your incisions.   PAIN MANAGEMENT Ibuprofen 800mg .  (This is the same as 4-200mg  over the counter tablets of Motrin or ibuprofen.)  Take this every 8 hours for the first 3 days then as needed for cramping/pain.   Acetaminophen 1000mg  (This is the same as 2-500mg  over the counter extra strength tylenol). Take this every 6 hours for the first 3 days or as needed afterwards for pain Oxycodone 5mg  For more severe pain, take one or two tablets every four to six hours as needed for pain control.  (Remember that narcotic pain medications increase your risk of constipation.  Please take senna nightly while taking oxycodone)  DO'S AND DON'T'S Do not take a tub bath for 4 weeks.  You may shower on the first day after your surgery Do not do any heavy lifting for two to four weeks.  This increases the chance of bleeding. Do move around as you feel able.  Stairs are fine.  You may begin to exercise again as you feel able.  Do not lift any weights  for four weeks.  REGULAR MEDIATIONS/VITAMINS: You may restart all of your regular medications as prescribed. You may restart all of your vitamins as you normally take them.    PLEASE CALL OR SEEK MEDICAL CARE IF: You have persistent nausea and vomiting.  You have trouble eating or drinking.  You have an oral temperature above 100.5.  You have constipation that is not helped by adjusting diet or increasing fluid intake. Pain medicines are a common cause of constipation.  You have heavy vaginal bleeding You have redness or drainage from your incision(s) or there is increasing pain or tenderness near or in the surgical site.

## 2024-01-07 NOTE — Anesthesia Postprocedure Evaluation (Signed)
Anesthesia Post Note  Patient: Fionna Merriott  Procedure(s) Performed: LAPAROSCOPIC BILATERAL SALPINGECTOMY (Bilateral: Abdomen)     Patient location during evaluation: PACU Anesthesia Type: General Level of consciousness: awake and alert, patient cooperative and oriented Pain management: pain level controlled (pt says she feels great) Vital Signs Assessment: post-procedure vital signs reviewed and stable Respiratory status: spontaneous breathing, nonlabored ventilation and respiratory function stable Cardiovascular status: blood pressure returned to baseline and stable Postop Assessment: no apparent nausea or vomiting, able to ambulate and adequate PO intake Anesthetic complications: no   No notable events documented.  Last Vitals:  Vitals:   01/07/24 0716 01/07/24 0925  BP: 114/81   Pulse: 80   Resp: 18   Temp: 36.4 C 36.5 C  SpO2: 96%     Last Pain:  Vitals:   01/07/24 1000  TempSrc:   PainSc: 10-Worst pain ever                 Talor Desrosiers,E. Jasani Lengel

## 2024-01-07 NOTE — H&P (Signed)
   PRE OPERATIVE HISTORY & PHYSICAL  Subjective:  Tonya Hart is a 33 y.o. [id number] with LMP 11/18/23 presenting for diagnostic laparoscopy, bilateral salpingectomy and possible excision of endometriosis  Longstanding irregular, severely painful periods that last 5-7 days. Pain is so back that she misses work. Has been like this since her teenage years. She has tried NSAIDs, heat, topical creams, and continuous COCs without relief in her period pain. Has some deep dyspareunia, some pain with BM, and dysuria with her period. She notes subfertility and that she had been having unprotected IC for a long time with her partner before conceiving. Only got pregnant after starting metformin for PCOS - periods did not become more regular. She has never tried PFPT. Her history is also complicated by fibromyalgia and chronic low back pain.   She also reports desire for permanent sterilization. She has 2 children and is happy with current family size. Even if something were to happen to her partner/children she would not want another pregnancy.  She does not want to try other forms of contraception.    PMH: PCOS, fibromyalgia, asthma, anxiety/depression PSH: T&A, shoulder Meds: cymbalta, buspar, meloxicam (on hold) All: Latex, chicken, bees OB: G2P2002 SVD x2 Soc: +THC & EtOH  Objective:   Vitals:   01/07/24 0716  BP: 114/81  Pulse: 80  Resp: 18  Temp: 97.6 F (36.4 C)  TempSrc: Oral  SpO2: 96%  Weight: 93.9 kg  Height: 5\' 8"  (1.727 m)   General:  Alert, oriented and cooperative. Patient is in no acute distress.  Skin: Skin is warm and dry. No rash noted.   Cardiovascular: Normal heart rate noted  Respiratory: Normal respiratory effort, no problems with respiration noted  Abdomen: Soft, non-tender, non-distended    Assessment and Plan:  Tonya Hart is a 33 y.o. with chronic pelvic pain and desire for permanent sterilization presenting for scheduled diagnostic laparoscopy,  bilateral salpingectomy, and excision of endometriosis  Diagnosis: request for permanent sterilization, chronic pelvic pain Surgery: diagnostic laparoscopy, bilateral salpingectomy, possible excision of endometriosis - Risks of surgery include but are not limited to: bleeding, infection, injury to surrounding organs/tissues (i.e. bowel/bladder/ureters), need for additional procedures, wound complications, hospital re-admission, and conversion to open surgery, VTE. We reviewed risk of contraceptive failure and risk of regret. Reviewed risk of exacerbating back pain after surgery.  - Reviewed restrictions and recovery following surgery - Medicaid sterilization consent signed - Consent signed - To OR when ready  Future Appointments  Date Time Provider Department Center  01/09/2024 11:15 AM Monica Becton, MD [facility] None    Lennart Pall, MD

## 2024-01-07 NOTE — Transfer of Care (Signed)
Immediate Anesthesia Transfer of Care Note  Patient: Tonya Hart  Procedure(s) Performed: LAPAROSCOPIC BILATERAL SALPINGECTOMY (Bilateral: Abdomen)  Patient Location: PACU  Anesthesia Type:General  Level of Consciousness: awake, alert , oriented, and patient cooperative  Airway & Oxygen Therapy: Patient Spontanous Breathing  Post-op Assessment: Report given to RN and Post -op Vital signs reviewed and stable  Post vital signs: Reviewed and stable  Last Vitals:  Vitals Value Taken Time  BP 118/78 01/07/24 0925  Temp    Pulse 64 01/07/24 0928  Resp 15 01/07/24 0928  SpO2 97 % 01/07/24 0928  Vitals shown include unfiled device data.  Last Pain:  Vitals:   01/07/24 0727  TempSrc:   PainSc: 0-No pain         Complications: No notable events documented.

## 2024-01-07 NOTE — Op Note (Signed)
Tonya Hart PROCEDURE DATE: 01/07/2024   PREOPERATIVE DIAGNOSIS:  Undesired fertility, chronic pelvic pain  POSTOPERATIVE DIAGNOSIS:  Undesired fertility, chronic pelvic pain  PROCEDURE:  Diagnostic laparoscopy, laparoscopic bilateral salpingectomy   SURGEON:  Dr. Harvie Bridge  ASSISTANT:  Dr. Buckhead Bing. An experienced assistant was required given the standard of surgical care given the complexity of the case.  This assistant was needed for exposure, dissection, suctioning, retraction, instrument exchange, and for overall help during the procedure.  ANESTHESIA:  General endotracheal  COMPLICATIONS:  None immediate.  ESTIMATED BLOOD LOSS:  5 ml.  FLUIDS: 1000 ml LR.  URINE OUTPUT:  200 ml of clear urine.  INDICATIONS: 33 y.o. [id number] with undesired fertility, desires permanent sterilization.  Other forms of contraception were discussed with patient and emphasized alternatives of vasectomy, IUDs and Nexplanon as they have equivalent contraceptive efficacy; she declines all other modalities.  Risks of procedure discussed with patient including permanence of method, risk of regret, bleeding, infection, injury to surrounding organs and need for additional procedures including laparotomy.  Failure risk less than 0.5% with increased risk of ectopic gestation if pregnancy occurs was also discussed with patient. We reviewed similar risks with diagnostic laparoscopy and possible excision of endometriosis if found at time of surgery. Specifically reviewed elevated risk of injury to other organs if excision of endometriosis is necessary. Written informed consent was obtained.    FINDINGS:  Normal uterus, fallopian tubes, and ovaries. Filmy adhesions between sigmoid/bowel and anterior abdominal wall unlikely to be significant. See intra op images. No evidence of endometriosis after careful examination of bladder peritoneum, uterus/tubes/ovaries, ovarian fossae and posterior cul de  sac.  TECHNIQUE:  After all consents were signed and questions were answered the patient was taken to the operating room where general anesthesia was induced. She was placed in dorsal lithotomy position. A catheter was used to drain her bladder. She was prepped and draped in the usual sterile fashion in the dorsal supine position. A timeout was performed.  A speculum was then placed in the patient's vagina, and the anterior lip of cervix grasped with the single-tooth tenaculum.  The uterine manipulator was then advanced into the uterus.  The speculum was removed from the vagina.  A umbilical incision was made and optical entry was used to enter the abdomen without complication. After confirmation of intraperitoneal entry, the abdomen was insufflated. The area under the point of entry was carefully inspected and no injury was noted. The patient was then placed in Trendelenburg position.  The scalpel was then used to make two 5mm incsions, one in the LLQ and one in the RLQ. 5mm optiview ports were inserted. Careful inspection of peritoneal surfaces revealed no lesions and no evidence of endometriosis. The fallopian tubes were cauterized, cut, and detached from their surrounding pelvic structures with the Ligaure. Fallopian tubes were removed through the lateral 5mm ports and sent to pathology. Small amount of oozing noted at the left cornua and left distal pedicle so these areas were re-sealed with the Ligasure. Careful attention was made to elevate the left ovary/initial pedicle to minimize risk of injury to IP, ureter well away from operative field. The pelvis was inspected and was hemostatic. Arista applied. All ports were then withdrawn and the gas drained from abdomen. They were then closed in a subcuticular fashion with 4-0 vicryl and surgical glue was placed.  The patient will be discharged to home as per PACU criteria.  Routine postoperative instructions given.  She will follow up in  the office in 4  wks for postoperative evaluation.  Harvie Bridge, MD Obstetrician & Gynecologist, [facility] for [employer], [facility]

## 2024-01-07 NOTE — Anesthesia Procedure Notes (Signed)
Procedure Name: Intubation Date/Time: 01/07/2024 8:16 AM  Performed by: Bishop Limbo, CRNAPre-anesthesia Checklist: Patient identified, Emergency Drugs available, Suction available and Patient being monitored Patient Re-evaluated:Patient Re-evaluated prior to induction Oxygen Delivery Method: Circle System Utilized Preoxygenation: Pre-oxygenation with 100% oxygen Induction Type: IV induction Ventilation: Mask ventilation without difficulty Laryngoscope Size: Mac and 3 Grade View: Grade I Tube type: Oral Tube size: 7.0 mm Number of attempts: 1 Airway Equipment and Method: Stylet Placement Confirmation: ETT inserted through vocal cords under direct vision, positive ETCO2 and breath sounds checked- equal and bilateral Secured at: 22 cm Tube secured with: Tape Dental Injury: Teeth and Oropharynx as per pre-operative assessment

## 2024-01-08 ENCOUNTER — Encounter: Payer: Self-pay | Admitting: Obstetrics and Gynecology

## 2024-01-08 ENCOUNTER — Encounter (HOSPITAL_COMMUNITY): Payer: Self-pay | Admitting: Obstetrics and Gynecology

## 2024-01-08 LAB — SURGICAL PATHOLOGY

## 2024-01-09 ENCOUNTER — Ambulatory Visit: Payer: Medicaid Other | Admitting: Sports Medicine

## 2024-01-09 MED ORDER — OXYCODONE HCL 5 MG PO TABS: 10.0000 mg | ORAL_TABLET | ORAL | 0 refills | Status: DC | PRN

## 2024-01-09 NOTE — Telephone Encounter (Signed)
Delayed entry. Called patient around 10am to review her concerns. Feels a little better today. Pain down to a 12 from a 13. Pain is mostly under ribs, around her ovaries and lower abdominal pressure that she thinks could be due to constipation/lack of bowel movement. Felt better with higher dose of oxycodone.  Doesn't feel sick, no fevers/vomiting and she is passing gas. Notes some nausea after taking opioids but that it is controlled with zofran.  Discussed sending refill on oxycodone since she will be taking the 10mg  dose throughout the day today and likely tomorrow. Would expect her to be able to space out doses and use lower dose over the next couple days with the goal being that she doesn't need opioids over the weekend. She should let us know if pain is not improving and be seen in ER for worsening pain, fevers, or nausea/vomiting.  Harvie Bridge, MD Obstetrician & Gynecologist, [facility] for [employer], [facility]

## 2024-01-16 ENCOUNTER — Ambulatory Visit: Payer: Medicaid Other | Admitting: Sports Medicine

## 2024-01-16 DIAGNOSIS — M5136 Other intervertebral disc degeneration, lumbar region with discogenic back pain only: Secondary | ICD-10-CM | POA: Diagnosis not present

## 2024-01-16 DIAGNOSIS — M797 Fibromyalgia: Secondary | ICD-10-CM

## 2024-01-16 MED ORDER — DULOXETINE HCL 60 MG PO CPEP: 60.0000 mg | ORAL_CAPSULE | Freq: Every day | ORAL | 3 refills | Status: DC

## 2024-01-16 NOTE — Progress Notes (Signed)
    Procedures performed today:    None.  Independent interpretation of notes and tests performed by another provider:   None.  Brief History, Exam, Impression, and Recommendations:    Lumbar degenerative disc disease Tonya Hart returns, she is a 33 year old female, she is about 6 months postpartum, she has a long history of axial low back pain, she has seen multiple providers including Dr. August Saucer with orthopedic surgery as well as Dr. Wynn Banker with pain management. She has had some formal and has been doing some home PT. Dr. Wynn Banker was unable to continue to treat her when she got pregnant and was breast-feeding. She has not responded to gabapentin or Lyrica, she had intractable sedation and brain fog. Did not respond to tricyclic's. Has never had epidurals. She does smoke marijuana twice a day to relieve her back pain. MRI from 2023 did show L5-S1 DDD, with a mild disc retrusion with desiccation. Pain is predominantly left-sided paralumbar and quadratus lumborum. We did another burst of steroids at the last visit which has not helped. At this point we will proceed with an epidural, left L5-S1 interlaminar. We also switched her from Lexapro to Cymbalta. Return to see me in 6 weeks.  Fibromyalgia Also with known fibromyalgia, as below she has had multiple neuropathic agents without improvement. She is currently on Cymbalta 30 switched from Lexapro, not noticing improvement yet, we will increase Cymbalta to 60 mg for 6 weeks and then potentially 120 if need be. Cymbalta is safe for breast-feeding.    ____________________________________________ Ihor Austin. Benjamin Stain, M.D., ABFM., CAQSM., AME. Primary Care and Sports Medicine [name]  Adjunct Professor of Family Medicine  [place] [employer] [employer]  Restaurant manager, fast food

## 2024-01-16 NOTE — Assessment & Plan Note (Signed)
 [name] returns, she is a 34 year old female, she is about 6 months postpartum, she has a long history of axial low back pain, she has seen multiple providers including Dr. August Saucer with orthopedic surgery as well as Dr. Wynn Banker with pain management. She has had some formal and has been doing some home PT. Dr. Wynn Banker was unable to continue to treat her when she got pregnant and was breast-feeding. She has not responded to gabapentin or Lyrica, she had intractable sedation and brain fog. Did not respond to tricyclic's. Has never had epidurals. She does smoke marijuana twice a day to relieve her back pain. MRI from 2023 did show L5-S1 DDD, with a mild disc retrusion with desiccation. Pain is predominantly left-sided paralumbar and quadratus lumborum. We did another burst of steroids at the last visit which has not helped. At this point we will proceed with an epidural, left L5-S1 interlaminar. We also switched her from Lexapro to Cymbalta. Return to see me in 6 weeks.

## 2024-01-16 NOTE — Assessment & Plan Note (Signed)
 Also with known fibromyalgia, as below she has had multiple neuropathic agents without improvement. She is currently on Cymbalta 30 switched from Lexapro, not noticing improvement yet, we will increase Cymbalta to 60 mg for 6 weeks and then potentially 120 if need be. Cymbalta is safe for breast-feeding.

## 2024-01-30 ENCOUNTER — Other Ambulatory Visit: Payer: Self-pay | Admitting: Sports Medicine

## 2024-01-30 DIAGNOSIS — M797 Fibromyalgia: Secondary | ICD-10-CM

## 2024-02-01 ENCOUNTER — Telehealth (INDEPENDENT_AMBULATORY_CARE_PROVIDER_SITE_OTHER): Admitting: Medical-Surgical

## 2024-02-01 ENCOUNTER — Encounter: Payer: Self-pay | Admitting: Medical-Surgical

## 2024-02-01 DIAGNOSIS — R000 Tachycardia, unspecified: Secondary | ICD-10-CM

## 2024-02-01 DIAGNOSIS — R42 Dizziness and giddiness: Secondary | ICD-10-CM | POA: Diagnosis not present

## 2024-02-01 DIAGNOSIS — R41840 Attention and concentration deficit: Secondary | ICD-10-CM

## 2024-02-02 MED ORDER — PROPRANOLOL HCL 10 MG PO TABS: 10.0000 mg | ORAL_TABLET | Freq: Three times a day (TID) | ORAL | 2 refills | Status: DC | PRN

## 2024-02-02 MED ORDER — VENLAFAXINE HCL ER 75 MG PO CP24: 75.0000 mg | ORAL_CAPSULE | Freq: Every day | ORAL | 1 refills | Status: DC

## 2024-02-02 NOTE — Progress Notes (Unsigned)
 Virtual Visit via Video Note  I connected with Tonya Hart on 02/03/24 at  2:40 PM EDT by a video enabled telemedicine application and verified that I am speaking with the correct person using two identifiers.   I discussed the limitations of evaluation and management by telemedicine and the availability of in person appointments. The patient expressed understanding and agreed to proceed.  Patient location: home Provider locations: office  Subjective:    CC: Would like testing for ADHD and POTS  HPI: Very pleasant 33 year old female presenting today via MyChart video visit with concerns regarding family history.  She has been doing some family history research and has learned that on 1 side of her family she has multiple family members with ADHD.  She has also found multiple family members with cardiovascular disease.  Notes that she has significant difficulty focusing and is unable to complete 1 task before she is off doing others.  Is interested in getting tested.  Admits to issues with depression and anxiety that are not well-controlled.  Now that she has 2 children, she feels that she is stretched thin and her body is giving out on her.  Additionally, she notes that she has issues with orthostasis and near syncope.  Her heart rate goes very high when she stands up and she often gets dizzy and has to sit back down.  This been going on for years and she is worried that maybe she has POTS.  Would like further investigation.   Past medical history, Surgical history, Family history not pertinant except as noted below, Social history, Allergies, and medications have been entered into the medical record, reviewed, and corrections made.   Review of Systems: See HPI for pertinent positives and negatives.   Objective:    General: Speaking clearly in complete sentences without any shortness of breath.  Alert and oriented x3.  Normal judgment. No apparent acute distress.  Impression and  Recommendations:    1. Inattention (Primary) Discussed the nature of inattention.  She does have 2 small children, 1 in newborn.  Reviewed the correlation between uncontrolled anxiety/depression and difficulty concentrating.  Recommend working to get her anxiety and depression under control prior to completing testing for ADHD.  She is currently on Cymbalta and reports that she does not like how the medication feels.  This was started by Dr. Karie Schwalbe in an effort to help with her chronic arthralgia/myalgia.  She does not want to continue this medication.  Switching from Cymbalta to Effexor per patient request.  Continues to have issues with breakthrough anxiety and we discussed the various options.  Since she has trouble with racing heart, adding very small dose propranolol 10 mg 3 times daily as needed only. - Ambulatory referral to Psychology  2. Lightheadedness 3. Racing heart beat Discussed the appropriate testing for POTS.  She is interested in pursuing so referring to cardiology. - Ambulatory referral to Cardiology  I discussed the assessment and treatment plan with the patient. The patient was provided an opportunity to ask questions and all were answered. The patient agreed with the plan and demonstrated an understanding of the instructions.   The patient was advised to call back or seek an in-person evaluation if the symptoms worsen or if the condition fails to improve as anticipated.  Return in about 4 weeks (around 02/29/2024) for mood follow up.  Thayer Ohm, DNP, APRN, FNP-BC [facility] Medicine

## 2024-02-04 ENCOUNTER — Encounter: Payer: Self-pay | Admitting: Sports Medicine

## 2024-02-04 ENCOUNTER — Other Ambulatory Visit

## 2024-02-06 ENCOUNTER — Telehealth: Payer: Medicaid Other | Admitting: Obstetrics and Gynecology

## 2024-02-06 DIAGNOSIS — Z9889 Other specified postprocedural states: Secondary | ICD-10-CM | POA: Diagnosis not present

## 2024-02-06 DIAGNOSIS — Z09 Encounter for follow-up examination after completed treatment for conditions other than malignant neoplasm: Secondary | ICD-10-CM

## 2024-02-06 NOTE — Progress Notes (Signed)
   TELEHEALTH GYNECOLOGY VISIT ENCOUNTER NOTE  Provider location: Center for [facility] [place]   Patient location: Home  I connected with Tonya Hart on 02/06/24 at 10:50 AM EDT by MyChart audio visual encounter   History:  Tonya Hart is a 33 y.o. [id number] 4 weeks s/p dx lsc, bilateral salpingectomy for permanent sterilization  Having a fibromyalgia flare, but pain from surgery has resolved. Notes a firm spot with her LLQ incision but has otherwise healed well. Tolerating diet, ambulating, voiding without issue.   Path c/w benign fallopian tubes. No endometriosis was seen or biopsied at time of surgery     Past Medical History:  Diagnosis Date   Anxiety and depression    no current medications   Asthma    Complication of anesthesia    Endometriosis    Environmental and seasonal allergies    Fibromyalgia    Gestational hypertension    Headache    PCOS (polycystic ovarian syndrome)    PONV (postoperative nausea and vomiting)    Seasonal allergies    Past Surgical History:  Procedure Laterality Date   LAPAROSCOPIC BILATERAL SALPINGECTOMY Bilateral 01/07/2024   Procedure: LAPAROSCOPIC BILATERAL SALPINGECTOMY;  Surgeon: Lennart Pall, MD;  Location: [facility] OR;  Service: Gynecology;  Laterality: Bilateral;   SHOULDER ARTHROSCOPY WITH LABRAL REPAIR Left 08/11/2021   Procedure: LEFT SHOULDER ARTHROSCOPY WITH LABRAL REPAIR;  Surgeon: Cammy Copa, MD;  Location: [facility] OR;  Service: Orthopedics;  Laterality: Left;   TONSILLECTOMY AND ADENOIDECTOMY  2013   The following portions of the patient's history were reviewed and updated as appropriate: allergies, current medications, past family history, past medical history, past social history, past surgical history and problem list.    Review of Systems:  Pertinent items noted in HPI and remainder of comprehensive ROS otherwise negative.  Physical Exam:   General:  Alert, oriented and cooperative.    Mental Status: Normal mood and affect perceived. Normal judgment and thought content.  Physical exam deferred due to nature of the encounter  Labs and Imaging No results found for this or any previous visit (from the past 2 weeks). No results found.    Assessment and Plan:     1. Postoperative examination (Primary) Incisions examined with phone and appear to have healed normally OK to resume normal activity without restrictions Intra op images and path reviewed  RTC in 1 year for annual exam or sooner prn  I provided 10 minutes of non-face-to-face time during this encounter.   Lennart Pall, MD Center for [employer], [facility]

## 2024-02-08 NOTE — Discharge Instructions (Signed)

## 2024-02-11 ENCOUNTER — Ambulatory Visit
Admission: RE | Admit: 2024-02-11 | Discharge: 2024-02-11 | Disposition: A | Discharge status: Home / Self Care | Source: Ambulatory Visit | Attending: Sports Medicine | Admitting: Sports Medicine

## 2024-02-11 DIAGNOSIS — M4727 Other spondylosis with radiculopathy, lumbosacral region: Secondary | ICD-10-CM | POA: Diagnosis not present

## 2024-02-11 DIAGNOSIS — M5136 Other intervertebral disc degeneration, lumbar region with discogenic back pain only: Secondary | ICD-10-CM

## 2024-02-11 DIAGNOSIS — M5117 Intervertebral disc disorders with radiculopathy, lumbosacral region: Secondary | ICD-10-CM | POA: Diagnosis not present

## 2024-02-11 MED ORDER — IOPAMIDOL (ISOVUE-M 200) INJECTION 41%
1.0000 mL | Freq: Once | INTRAMUSCULAR | Status: AC
  Administered 2024-02-11: 1 mL via EPIDURAL

## 2024-02-11 MED ORDER — METHYLPREDNISOLONE ACETATE 40 MG/ML INJ SUSP (RADIOLOG
80.0000 mg | Freq: Once | INTRAMUSCULAR | Status: AC
  Administered 2024-02-11: 80 mg via EPIDURAL

## 2024-02-20 NOTE — Progress Notes (Unsigned)
 Cardiology Office Note:   Date:  02/22/2024  ID:  Tonya Hart, DOB Oct 04, 1991, MRN [id number] PCP:  Christen Butter, NP  [facility] Providers Cardiologist:  Alverda Skeans, MD Referring MD: Christen Butter, NP  Chief Complaint/Reason for Referral: Palpitations, lightheadedness ASSESSMENT:    1. Dizziness   2. Precordial pain     PLAN:   In order of problems listed above: Dizziness: Checks reflex TSH, echocardiogram, and monitor.  Positional blood pressures did not indicate orthostatic hypotension or POTS syndrome.  If cardiac evaluation is reassuring then perhaps an neurologic or ENT evaluation should be pursued. Chest pain: Patient has exquisite point tenderness of her left flank.  Atypical and likely musculoskeletal or related to myalgia.  Will defer ischemic evaluation.            Dispo:  Return if symptoms worsen or fail to improve.      Medication Adjustments/Labs and Tests Ordered: Current medicines are reviewed at length with the patient today.  Concerns regarding medicines are outlined above.  The following changes have been made:  no change   Labs/tests ordered: Orders Placed This Encounter  Procedures   TSH Rfx on Abnormal to Free T4   LONG TERM MONITOR (3-14 DAYS)   EKG 12-Lead   ECHOCARDIOGRAM COMPLETE    Medication Changes: No orders of the defined types were placed in this encounter.   Current medicines are reviewed at length with the patient today.  The patient does not have concerns regarding medicines.     History of Present Illness:      FOCUSED PROBLEM LIST:   Possible ADHD Fibromyalgia  April 2025:  Patient consents to use of AI scribe. The patient is a 33 year old female with the above listed medical problems referred by her PCP for lightheadedness.  The patient was seen by her PCP recently.  She reported episodes of presyncope.  This has been going on for some time.  She was concerned about a diagnosis of POTS.  For this reason she is  referred for further evaluation.  She experiences dizziness daily, occurring multiple times a day, and heart palpitations a couple of times a week, particularly during physical activity. She manages palpitations by sitting down and taking deep breaths. Previous evaluations have ruled out POTS and orthostatic hypotension.  She describes a stabbing pain under her left breast that occurs with exertion, making it painful to breathe, cough, or talk. The pain is exacerbated by pressure on the area and does not occur at rest. This pain is consistent with fibromyalgia pressure points.  She experiences swelling in her hands, requiring her to remove her rings, and occasionally mild swelling around her ankles. She cannot lie flat without difficulty breathing and uses pillows to sleep. She quit smoking in 2019 after contracting the flu.  She exercises daily, [place] but needs to stop frequently to catch her breath. She attributes some of this to allergies and fibromyalgia, which causes pain and weakness. She has been experiencing weight fluctuations despite maintaining her usual level of activity and changing her diet to manage fibromyalgia symptoms.  She is currently taking Lexapro and Buspirone, and occasionally takes propranolol for heart palpitations. She is undergoing testing for ADHD and is awaiting further appointments.        Current Medications: Current Meds  Medication Sig   busPIRone (BUSPAR) 15 MG tablet Take 1 tablet (15 mg total) by mouth 3 (three) times daily.   escitalopram (LEXAPRO) 10  MG tablet Take 1 tablet (10 mg total) by mouth daily.   ibuprofen (ADVIL) 800 MG tablet Take 1 tablet (800 mg total) by mouth every 8 (eight) hours.   propranolol (INDERAL) 10 MG tablet Take 1 tablet (10 mg total) by mouth 3 (three) times daily as needed.   venlafaxine XR (EFFEXOR XR) 37.5 MG 24 hr capsule Take 1 capsule (37.5 mg total) by mouth daily with  breakfast.     Review of Systems:   Please see the history of present illness.    All other systems reviewed and are negative.     EKGs/Labs/Other Test Reviewed:   EKG:    EKG Interpretation Date/Time:  Friday February 22 2024 14:01:35 EDT Ventricular Rate:  92 PR Interval:  132 QRS Duration:  84 QT Interval:  312 QTC Calculation: 385 R Axis:   72  Text Interpretation: Normal sinus rhythm T wave abnormality, consider inferior ischemia No previous ECGs available Confirmed by Alverda Skeans ([id number]) on 02/22/2024 2:16:43 PM         Risk Assessment/Calculations:          Physical Exam:   VS:  BP 127/82 (Patient Position: Standing) Comment (Patient Position): at 3 minutes  Pulse (!) 101   Ht 5\' 8"  (1.727 m)   Wt 209 lb (94.8 kg)   SpO2 97%   BMI 31.78 kg/m        Wt Readings from Last 3 Encounters:  02/22/24 209 lb (94.8 kg)  02/22/24 210 lb 9.6 oz (95.5 kg)  01/07/24 207 lb (93.9 kg)      GENERAL:  No apparent distress, AOx3 HEENT:  No carotid bruits, +2 carotid impulses, no scleral icterus CAR: RRR no murmurs, gallops, rubs, or thrills MI; point tenderness of left flank under left breast RES:  Clear to auscultation bilaterally ABD:  Soft, nontender, nondistended, positive bowel sounds x 4 VASC:  +2 radial pulses, +2 carotid pulses NEURO:  CN 2-12 grossly intact; motor and sensory grossly intact PSYCH:  No active depression or anxiety EXT:  No edema, ecchymosis, or cyanosis  Signed, Orbie Pyo, MD  02/22/2024 2:33 PM    [facility] [address] [place], [place], [place]  [address] Phone: [phone]; Fax: [phone]   Note:  This document was prepared using Dragon voice recognition software and may include unintentional dictation errors.

## 2024-02-22 ENCOUNTER — Ambulatory Visit: Attending: Internal Medicine

## 2024-02-22 ENCOUNTER — Ambulatory Visit (INDEPENDENT_AMBULATORY_CARE_PROVIDER_SITE_OTHER): Admitting: Medical-Surgical

## 2024-02-22 ENCOUNTER — Ambulatory Visit: Attending: Internal Medicine | Admitting: Internal Medicine

## 2024-02-22 ENCOUNTER — Encounter: Payer: Self-pay | Admitting: Internal Medicine

## 2024-02-22 ENCOUNTER — Encounter: Payer: Self-pay | Admitting: Medical-Surgical

## 2024-02-22 VITALS — BP 109/70 | HR 97 | Resp 20 | Ht 68.0 in | Wt 210.6 lb

## 2024-02-22 VITALS — BP 127/82 | HR 101 | Ht 68.0 in | Wt 209.0 lb

## 2024-02-22 DIAGNOSIS — F419 Anxiety disorder, unspecified: Secondary | ICD-10-CM | POA: Diagnosis not present

## 2024-02-22 DIAGNOSIS — R42 Dizziness and giddiness: Secondary | ICD-10-CM

## 2024-02-22 DIAGNOSIS — R072 Precordial pain: Secondary | ICD-10-CM | POA: Diagnosis not present

## 2024-02-22 DIAGNOSIS — M797 Fibromyalgia: Secondary | ICD-10-CM | POA: Diagnosis not present

## 2024-02-22 DIAGNOSIS — Z0000 Encounter for general adult medical examination without abnormal findings: Secondary | ICD-10-CM | POA: Diagnosis not present

## 2024-02-22 DIAGNOSIS — Z13220 Encounter for screening for lipoid disorders: Secondary | ICD-10-CM | POA: Diagnosis not present

## 2024-02-22 DIAGNOSIS — R002 Palpitations: Secondary | ICD-10-CM | POA: Diagnosis not present

## 2024-02-22 MED ORDER — BUSPIRONE HCL 15 MG PO TABS
15.0000 mg | ORAL_TABLET | Freq: Three times a day (TID) | ORAL | 3 refills | Status: AC
Start: 2024-02-22 — End: ?

## 2024-02-22 MED ORDER — ESCITALOPRAM OXALATE 10 MG PO TABS: 10.0000 mg | ORAL_TABLET | Freq: Every day | ORAL | 3 refills | Status: AC

## 2024-02-22 MED ORDER — VENLAFAXINE HCL ER 37.5 MG PO CP24: 37.5000 mg | ORAL_CAPSULE | Freq: Every day | ORAL | 0 refills | Status: DC

## 2024-02-22 NOTE — Progress Notes (Signed)
 Complete physical exam  Patient: Tonya Hart   DOB: 02-Mar-1991   32 y.o. Female  MRN: [id number]  Subjective:    Chief Complaint  Patient presents with   Medical Management of Chronic Issues    Pt would like to discuss recent changes to medications for anxiety and depression   Annual Exam    Tonya Hart is a 33 y.o. female who presents today for a complete physical exam. She reports consuming a general diet.  Regular intentional exercise.   She generally feels poorly. She reports sleeping poorly. She does not have additional problems to discuss today.    Most recent fall risk assessment:    10/27/2022    2:37 PM  Fall Risk   Falls in the past year? 1  Number falls in past yr: 1  Injury with Fall? 0     Most recent depression screenings:    06/14/2023    8:28 AM 04/09/2023    8:09 AM  PHQ 2/9 Scores  PHQ - 2 Score 0 0  PHQ- 9 Score 1 2    Vision:Not within last year , Dental: No current dental problems and Receives regular dental care, and STD: The patient denies history of sexually transmitted disease.    Patient Care Team: Christen Butter, NP as PCP - General (Nurse Practitioner) Glendale Chard, DO as Consulting Physician (Neurology)   Outpatient Medications Prior to Visit  Medication Sig   ibuprofen (ADVIL) 800 MG tablet Take 1 tablet (800 mg total) by mouth every 8 (eight) hours.   propranolol (INDERAL) 10 MG tablet Take 1 tablet (10 mg total) by mouth 3 (three) times daily as needed.   venlafaxine XR (EFFEXOR XR) 75 MG 24 hr capsule Take 1 capsule (75 mg total) by mouth daily with breakfast.   [DISCONTINUED] ondansetron (ZOFRAN-ODT) 4 MG disintegrating tablet Take 1 tablet (4 mg total) by mouth every 6 (six) hours as needed for nausea. (Patient not taking: Reported on 02/22/2024)   [DISCONTINUED] oxyCODONE (OXY IR/ROXICODONE) 5 MG immediate release tablet Take 2 tablets (10 mg total) by mouth every 4 (four) hours as needed for breakthrough pain. (Patient  not taking: Reported on 02/22/2024)   [DISCONTINUED] senna-docusate (SENOKOT-S) 8.6-50 MG tablet Take 2 tablets by mouth at bedtime. (Patient not taking: Reported on 02/22/2024)   No facility-administered medications prior to visit.    Review of Systems  Constitutional:  Positive for malaise/fatigue.  Respiratory:  Positive for shortness of breath. Negative for cough and wheezing.   Cardiovascular:  Positive for chest pain and palpitations. Negative for leg swelling.  Gastrointestinal:  Positive for heartburn, nausea and vomiting. Negative for blood in stool and melena.  Genitourinary: Negative.   Musculoskeletal:  Positive for back pain, falls, joint pain, myalgias and neck pain.  Neurological:  Positive for dizziness, weakness and headaches.  Endo/Heme/Allergies:  Positive for environmental allergies.  Psychiatric/Behavioral:  Positive for depression and substance abuse (Marijuana). Negative for suicidal ideas. The patient is nervous/anxious and has insomnia.      Objective:     BP 109/70 (BP Location: Left Arm, Cuff Size: Normal)   Pulse 97   Resp 20   Ht 5\' 8"  (1.727 m)   Wt 210 lb 9.6 oz (95.5 kg)   SpO2 98%   BMI 32.02 kg/m    Physical Exam Vitals reviewed.  Constitutional:      General: She is not in acute distress.    Appearance: Normal appearance. She is not ill-appearing.  HENT:  Head: Normocephalic and atraumatic.     Right Ear: Tympanic membrane, ear canal and external ear normal. There is no impacted cerumen.     Left Ear: Tympanic membrane, ear canal and external ear normal. There is no impacted cerumen.     Nose: Nose normal. No congestion or rhinorrhea.     Mouth/Throat:     Mouth: Mucous membranes are moist.     Pharynx: No oropharyngeal exudate or posterior oropharyngeal erythema.  Eyes:     General: No scleral icterus.       Right eye: No discharge.        Left eye: No discharge.     Extraocular Movements: Extraocular movements intact.      Conjunctiva/sclera: Conjunctivae normal.     Pupils: Pupils are equal, round, and reactive to light.  Neck:     Thyroid: No thyromegaly.     Vascular: No carotid bruit or JVD.     Trachea: Trachea normal.  Cardiovascular:     Rate and Rhythm: Normal rate and regular rhythm.     Pulses: Normal pulses.     Heart sounds: Normal heart sounds. No murmur heard.    No friction rub. No gallop.  Pulmonary:     Effort: Pulmonary effort is normal. No respiratory distress.     Breath sounds: Normal breath sounds. No wheezing.  Abdominal:     General: Bowel sounds are normal. There is no distension.     Palpations: Abdomen is soft.     Tenderness: There is no abdominal tenderness. There is no guarding.  Musculoskeletal:        General: Normal range of motion.     Cervical back: Normal range of motion and neck supple.  Lymphadenopathy:     Cervical: No cervical adenopathy.  Skin:    General: Skin is warm and dry.  Neurological:     Mental Status: She is alert and oriented to person, place, and time.     Cranial Nerves: No cranial nerve deficit.  Psychiatric:        Mood and Affect: Mood normal.        Behavior: Behavior normal.        Thought Content: Thought content normal.        Judgment: Judgment normal.   No results found for any visits on 02/22/24.     Assessment & Plan:    Routine Health Maintenance and Physical Exam  Immunization History  Administered Date(s) Administered   Influenza Inj Mdck Quad Pf 09/26/2021   Influenza,inj,Quad PF,6+ Mos 12/20/2022   Influenza-Unspecified 12/22/2014   Pneumococcal Polysaccharide-23 04/02/2021   Tdap 01/04/2021, 04/09/2023    Health Maintenance  Topic Date Due   COVID-19 Vaccine (1 - 2024-25 season) 03/09/2024 (Originally 07/22/2023)   INFLUENZA VACCINE  06/20/2024   Cervical Cancer Screening (HPV/Pap Cotest)  12/20/2025   DTaP/Tdap/Td (3 - Td or Tdap) 04/08/2033   Hepatitis C Screening  Completed   HIV Screening  Completed    Pneumococcal Vaccine 49-66 Years old  Aged Out   HPV VACCINES  Aged Out    Discussed health benefits of physical activity, and encouraged her to engage in regular exercise appropriate for her age and condition.  1. Annual physical exam (Primary) Checking labs as below. UTD on preventative care. Wellness information provided with AVS. - CBC - CMP14+EGFR - Lipid panel  2. Fibromyalgia Has significant complaints of bodyaches and pains and has been through multiple medications to attempt symptom management.  Most recently, we  had started Effexor however she reports she does not like the way the medicine makes her feel and would like to come off this.  Plan for slow taper over several weeks in order to discontinue the medication.  3. Anxiety As above, noted that she does not like Effexor.  Will plan to switch over with the cross-taper to Lexapro 10 mg daily.  Previously treated with Lexapro 20 mg daily but we will make sure to taper slowly on Effexor and go slowly on increasing Lexapro.  Plan close follow-up in 4 to 6 weeks to evaluate tolerance and response.  Prior use of BuSpar 15 mg 3 times daily was helpful.  Refilling today.  4. Lipid screening Checking lipid panel today. - Lipid panel   No follow-ups on file.     Christen Butter, NP

## 2024-02-22 NOTE — Progress Notes (Unsigned)
 Enrolled patient for a 3 day Zio XT monitor to be mailed to patients home

## 2024-02-22 NOTE — Patient Instructions (Signed)
 Medication Instructions:  Your physician recommends that you continue on your current medications as directed. Please refer to the Current Medication list given to you today.  *If you need a refill on your cardiac medications before your next appointment, please call your pharmacy*  Lab Work: TODAY: reflex TSH If you have labs (blood work) drawn today and your tests are completely normal, you will receive your results only by: MyChart Message (if you have MyChart) OR A paper copy in the mail If you have any lab test that is abnormal or we need to change your treatment, we will call you to review the results.  Testing/Procedures: Your physician has requested that you wear a Zio heart monitor for 3 days. This will be mailed to your home with instructions on how to apply the monitor and how to return it when finished. Please allow 2 weeks after returning the heart monitor before our office calls you with the results.   Your physician has requested that you have an echocardiogram. Echocardiography is a painless test that uses sound waves to create images of your heart. It provides your doctor with information about the size and shape of your heart and how well your heart's chambers and valves are working. This procedure takes approximately one hour. There are no restrictions for this procedure. Please do NOT wear cologne, perfume, aftershave, or lotions (deodorant is allowed). Please arrive 15 minutes prior to your appointment time.  Please note: We ask at that you not bring children with you during ultrasound (echo/ vascular) testing. Due to room size and safety concerns, children are not allowed in the ultrasound rooms during exams. Our front office staff cannot provide observation of children in our lobby area while testing is being conducted. An adult accompanying a patient to their appointment will only be allowed in the ultrasound room at the discretion of the ultrasound technician under special  circumstances. We apologize for any inconvenience.  Follow-Up: At [facility], you and your health needs are our priority.  As part of our continuing mission to provide you with exceptional heart care, we have created designated Provider Care Teams.  These Care Teams include your primary Cardiologist (physician) and Advanced Practice Providers (APPs -  Physician Assistants and Nurse Practitioners) who all work together to provide you with the care you need, when you need it.  We recommend signing up for the patient portal called "MyChart".  Sign up information is provided on this After Visit Summary.  MyChart is used to connect with patients for Virtual Visits (Telemedicine).  Patients are able to view lab/test results, encounter notes, upcoming appointments, etc.  Non-urgent messages can be sent to your provider as well.   To learn more about what you can do with MyChart, go to [email or web address].    Your next appointment:   As needed  The format for your next appointment:   In Person  Provider:   Orbie Pyo, MD {  Other Instructions Christena Deem- Long Term Monitor Instructions     Your physician has requested you wear a ZIO patch monitor for 3 days.  This is a single patch monitor. [employer] supplies one patch monitor per enrollment. Additional  stickers are not available. Please do not apply patch if you will be having a Nuclear Stress Test,  Echocardiogram, Cardiac CT, MRI, or Chest Xray during the period you would be wearing the  monitor. The patch cannot be worn during these tests. You cannot remove and re-apply the  ZIO  XT patch monitor.  Your ZIO patch monitor will be mailed 3 day USPS to your address on file. It may take 3-5 days  to receive your monitor after you have been enrolled.  Once you have received your monitor, please review the enclosed instructions. Your monitor  has already been registered assigning a specific monitor serial # to you.     Billing and Patient  Assistance Program Information     We have supplied [employer] with any of your insurance information on file for billing purposes.  [employer] offers a sliding scale Patient Assistance Program for patients that do not have  insurance, or whose insurance does not completely cover the cost of the ZIO monitor.  You must apply for the Patient Assistance Program to qualify for this discounted rate.  To apply, please call [employer] at [phone], select option 4, select option 2, ask to apply for  Patient Assistance Program. Meredeth Ide will ask your household income, and how many people  are in your household. They will quote your out-of-pocket cost based on that information.  [employer] will also be able to set up a 40-month, interest-free payment plan if needed.     Applying the monitor     Shave hair from upper left chest.  Hold abrader disc by orange tab. Rub abrader in 40 strokes over the upper left chest as  indicated in your monitor instructions.  Clean area with 4 enclosed alcohol pads. Let dry.  Apply patch as indicated in monitor instructions. Patch will be placed under collarbone on left  side of chest with arrow pointing upward.  Rub patch adhesive wings for 2 minutes. Remove white label marked "1". Remove the white  label marked "2". Rub patch adhesive wings for 2 additional minutes.  While looking in a mirror, press and release button in center of patch. A small green light will  flash 3-4 times. This will be your only indicator that the monitor has been turned on.  Do not shower for the first 24 hours. You may shower after the first 24 hours.  Press the button if you feel a symptom. You will hear a small click. Record Date, Time and  Symptom in the Patient Logbook.  When you are ready to remove the patch, follow instructions on the last 2 pages of Patient  Logbook. Stick patch monitor onto the last page of Patient Logbook.  Place Patient Logbook in the blue and white box. Use locking tab on  box and tape box closed  securely. The blue and white box has prepaid postage on it. Please place it in the mailbox as  soon as possible. Your physician should have your test results approximately 7 days after the  monitor has been mailed back to [facility].  Call [facility] Customer Care at [phone] if you have questions regarding  your ZIO XT patch monitor. Call them immediately if you see an orange light blinking on your  monitor.  If your monitor falls off in less than 4 days, contact our Monitor department at [phone].  If your monitor becomes loose or falls off after 4 days call [employer] at [phone] for  suggestions on securing your monitor.      1st Floor: - Lobby - Registration  - Pharmacy  - Lab - Cafe  2nd Floor: - PV Lab - Diagnostic Testing (echo, CT, nuclear med)  3rd Floor: - Vacant  4th Floor: - TCTS (cardiothoracic surgery) - AFib Clinic - Structural Heart Clinic - Vascular Surgery  -  Vascular Ultrasound  5th Floor: - HeartCare Cardiology (general and EP) - Clinical Pharmacy for coumadin, hypertension, lipid, weight-loss medications, and med management appointments    Valet parking services will be available as well.

## 2024-02-22 NOTE — Patient Instructions (Addendum)
 Week 1: take Effexor 75mg  on day one then Effexor 37.5mg  on day two. Alternate for a full week. Week 2: Take Effexor 37.5mg  daily. Start Lexapro 10mg  daily.  Week 3: Take Effexor 37.5mg  every other day. Continue Lexapro 10mg  daily.  Week 4: Stop Effexor. Continue Lexapro  Preventive Care 19-32 Years Old, Female Preventive care refers to lifestyle choices and visits with your health care provider that can promote health and wellness. Preventive care visits are also called wellness exams. What can I expect for my preventive care visit? Counseling During your preventive care visit, your health care provider may ask about your: Medical history, including: Past medical problems. Family medical history. Pregnancy history. Current health, including: Menstrual cycle. Method of birth control. Emotional well-being. Home life and relationship well-being. Sexual activity and sexual health. Lifestyle, including: Alcohol, nicotine or tobacco, and drug use. Access to firearms. Diet, exercise, and sleep habits. Work and work Astronomer. Sunscreen use. Safety issues such as seatbelt and bike helmet use. Physical exam Your health care provider may check your: Height and weight. These may be used to calculate your BMI (body mass index). BMI is a measurement that tells if you are at a healthy weight. Waist circumference. This measures the distance around your waistline. This measurement also tells if you are at a healthy weight and may help predict your risk of certain diseases, such as type 2 diabetes and high blood pressure. Heart rate and blood pressure. Body temperature. Skin for abnormal spots. What immunizations do I need?  Vaccines are usually given at various ages, according to a schedule. Your health care provider will recommend vaccines for you based on your age, medical history, and lifestyle or other factors, such as travel or where you work. What tests do I need? Screening Your  health care provider may recommend screening tests for certain conditions. This may include: Pelvic exam and Pap test. Lipid and cholesterol levels. Diabetes screening. This is done by checking your blood sugar (glucose) after you have not eaten for a while (fasting). Hepatitis B test. Hepatitis C test. HIV (human immunodeficiency virus) test. STI (sexually transmitted infection) testing, if you are at risk. BRCA-related cancer screening. This may be done if you have a family history of breast, ovarian, tubal, or peritoneal cancers. Talk with your health care provider about your test results, treatment options, and if necessary, the need for more tests. Follow these instructions at home: Eating and drinking  Eat a healthy diet that includes fresh fruits and vegetables, whole grains, lean protein, and low-fat dairy products. Take vitamin and mineral supplements as recommended by your health care provider. Do not drink alcohol if: Your health care provider tells you not to drink. You are pregnant, may be pregnant, or are planning to become pregnant. If you drink alcohol: Limit how much you have to 0-1 drink a day. Know how much alcohol is in your drink. In the [place] one drink equals one 12 oz bottle of beer (355 mL), one 5 oz glass of wine (148 mL), or one 1 oz glass of hard liquor (44 mL). Lifestyle Brush your teeth every morning and night with fluoride toothpaste. Floss one time each day. Exercise for at least 30 minutes 5 or more days each week. Do not use any products that contain nicotine or tobacco. These products include cigarettes, chewing tobacco, and vaping devices, such as e-cigarettes. If you need help quitting, ask your health care provider. Do not use drugs. If you are sexually active, practice safe  sex. Use a condom or other form of protection to prevent STIs. If you do not wish to become pregnant, use a form of birth control. If you plan to become pregnant, see your health  care provider for a prepregnancy visit. Find healthy ways to manage stress, such as: Meditation, yoga, or listening to music. Journaling. Talking to a trusted person. Spending time with friends and family. Minimize exposure to UV radiation to reduce your risk of skin cancer. Safety Always wear your seat belt while driving or riding in a vehicle. Do not drive: If you have been drinking alcohol. Do not ride with someone who has been drinking. If you have been using any mind-altering substances or drugs. While texting. When you are tired or distracted. Wear a helmet and other protective equipment during sports activities. If you have firearms in your house, make sure you follow all gun safety procedures. Seek help if you have been physically or sexually abused. What's next? Go to your health care provider once a year for an annual wellness visit. Ask your health care provider how often you should have your eyes and teeth checked. Stay up to date on all vaccines. This information is not intended to replace advice given to you by your health care provider. Make sure you discuss any questions you have with your health care provider. Document Revised: 05/04/2021 Document Reviewed: 05/04/2021 Elsevier Patient Education  2024 [employer].

## 2024-02-23 ENCOUNTER — Encounter: Payer: Self-pay | Admitting: Internal Medicine

## 2024-02-23 LAB — TSH RFX ON ABNORMAL TO FREE T4: TSH: 1.64 u[IU]/mL (ref 0.450–4.500)

## 2024-02-27 ENCOUNTER — Encounter: Payer: Self-pay | Admitting: Sports Medicine

## 2024-02-27 ENCOUNTER — Ambulatory Visit (INDEPENDENT_AMBULATORY_CARE_PROVIDER_SITE_OTHER): Payer: Medicaid Other | Admitting: Sports Medicine

## 2024-02-27 DIAGNOSIS — M51362 Other intervertebral disc degeneration, lumbar region with discogenic back pain and lower extremity pain: Secondary | ICD-10-CM

## 2024-02-27 NOTE — Progress Notes (Addendum)
    Procedures performed today:    None.  Independent interpretation of notes and tests performed by another provider:   None.  Brief History, Exam, Impression, and Recommendations:    Lumbar spondylosis Tonya Hart returns, she is a pleasant 33 year old female, she is now approximately 7-1/2 months postpartum, long history of axial low back pain, she has seen multiple providers including [name] with Ortho and [name] with pain management. She has already done formal PT, she tells me she is continuing to do her home PT regularly. She has not responded to gabapentin  or Lyrica  or tricyclic antidepressants, these all caused intractable sedation and brain fog. There has been some marijuana use approximately twice daily to relieve back pain. MRI in 2023 did show L5-S1 DDD with mild disc protrusion and desiccation but no areas of neuroforaminal stenosis. 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 Clinically her pain is predominantly left-sided paralumbar and quadratus lumborum. Couple of visits ago we did a burst of steroids which did not help, she had a left L5-S1 interlaminar epidural at the end of March, unfortunately has not yet noted any improvement. We switched her from Lexapro  to Cymbalta  in hopes of alleviating some of the myofascial component of her pain, she did not tolerate this after over 6 weeks of treatment, she was then switched to Effexor , she had no improvement over a month and her mood worsened, she was then appropriately switched back to Lexapro  which does help with the mood. She returns today for further discussion, she continues to have back pain, I advised her we were getting typically limited what modern medicine had to offer, as she did have facet arthropathy it did make sense to try facet joint injections, I would want a new MRI prior to doing that. We also discussed complementary and alternative medicine techniques such as chiropractors and acupuncturist. She will  discuss this with her husband and let me know if she would like to take 1 of these approaches, of course we could also take a watchful waiting type approach. Of note, the new MRI would be because she is complaining of radicular symptoms down past the knee, and her MRI from 2023 did not show any areas of neuroforaminal stenosis.  Update: Pt sent a mychart message, she would like to proceed with the facet injections since the epidurals didn't work.  Since pain is mostly axial and left sided and epidural not effective, we will proceed with left L3-S1 facet joint injections.  She has no areas of foraminal stenosis on MRI, so any pain radiating down leg to foot is not radicular.  I do suspect with the paucity of findings on her L-spine MRI that she will ultimately need medical management rather than interventional or surgical management.  I spent 30 minutes of total time managing this patient today, this includes chart review, face to face, and non-face to face time.  ____________________________________________ Joselyn Nicely. Sandy Crumb, M.D., ABFM., CAQSM., AME. Primary Care and Sports Medicine [facility]  Adjunct Professor of [facility] Medicine  [employer]  Restaurant manager, fast food

## 2024-02-27 NOTE — Assessment & Plan Note (Addendum)
 Tonya Hart returns, she is a pleasant 33 year old female, she is now approximately 7-1/2 months postpartum, long history of axial low back pain, she has seen multiple providers including [name] with Ortho and [name] with pain management. She has already done formal PT, she tells me she is continuing to do her home PT regularly. She has not responded to gabapentin  or Lyrica  or tricyclic antidepressants, these all caused intractable sedation and brain fog. There has been some marijuana use approximately twice daily to relieve back pain. MRI in 2023 did show L5-S1 DDD with mild disc protrusion and desiccation but no areas of neuroforaminal stenosis. 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 Clinically her pain is predominantly left-sided paralumbar and quadratus lumborum. Couple of visits ago we did a burst of steroids which did not help, she had a left L5-S1 interlaminar epidural at the end of March, unfortunately has not yet noted any improvement. We switched her from Lexapro  to Cymbalta  in hopes of alleviating some of the myofascial component of her pain, she did not tolerate this after over 6 weeks of treatment, she was then switched to Effexor , she had no improvement over a month and her mood worsened, she was then appropriately switched back to Lexapro  which does help with the mood. She returns today for further discussion, she continues to have back pain, I advised her we were getting typically limited what modern medicine had to offer, as she did have facet arthropathy it did make sense to try facet joint injections, I would want a new MRI prior to doing that. We also discussed complementary and alternative medicine techniques such as chiropractors and acupuncturist. She will discuss this with her husband and let me know if she would like to take 1 of these approaches, of course we could also take a watchful waiting type approach. Of note, the new MRI would be because she is complaining of  radicular symptoms down past the knee, and her MRI from 2023 did not show any areas of neuroforaminal stenosis.  Update: Pt sent a mychart message, she would like to proceed with the facet injections since the epidurals didn't work.  Since pain is mostly axial and left sided and epidural not effective, we will proceed with left L3-S1 facet joint injections.  She has no areas of foraminal stenosis on MRI, so any pain radiating down leg to foot is not radicular.  I do suspect with the paucity of findings on her L-spine MRI that she will ultimately need medical management rather than interventional or surgical management.  For Medicaid insurance coverage the patient has failed over 6 weeks of physician directed physical therapy including McKenzie and core stabilization type exercises, spinal extension exercises, planking, traction, she did these exercises 15 reps, 3 sets per day for over 6 weeks and has not noted improvement.

## 2024-02-28 ENCOUNTER — Encounter: Payer: Self-pay | Admitting: Medical-Surgical

## 2024-02-28 LAB — CMP14+EGFR
ALT: 16 IU/L (ref 0–32)
AST: 12 IU/L (ref 0–40)
Albumin: 5 g/dL (ref 4.0–5.0)
Alkaline Phosphatase: 93 IU/L (ref 44–121)
BUN/Creatinine Ratio: 16 (ref 9–23)
BUN: 11 mg/dL (ref 6–20)
Bilirubin Total: <0.2 mg/dL (ref 0.0–1.2)
CO2: 23 mmol/L (ref 20–29)
Calcium: 9.6 mg/dL (ref 8.7–10.2)
Chloride: 101 mmol/L (ref 96–106)
Creatinine, Ser: 0.69 mg/dL (ref 0.57–1.00)
Globulin, Total: 2.3 g/dL (ref 1.5–4.5)
Glucose: 78 mg/dL (ref 70–99)
Potassium: 4.6 mmol/L (ref 3.5–5.2)
Sodium: 139 mmol/L (ref 134–144)
Total Protein: 7.3 g/dL (ref 6.0–8.5)
eGFR: 120 mL/min/{1.73_m2} (ref >59)

## 2024-02-28 LAB — CBC
Hematocrit: 41.4 % (ref 34.0–46.6)
Hemoglobin: 14 g/dL (ref 11.1–15.9)
MCH: 31.7 pg (ref 26.6–33.0)
MCHC: 33.8 g/dL (ref 31.5–35.7)
MCV: 94 fL (ref 79–97)
Platelets: 243 10*3/uL (ref 150–450)
RBC: 4.42 x10E6/uL (ref 3.77–5.28)
RDW: 12.2 % (ref 11.7–15.4)
WBC: 7.9 10*3/uL (ref 3.4–10.8)

## 2024-02-28 LAB — LIPID PANEL
Chol/HDL Ratio: 2.8 ratio (ref 0.0–4.4)
Cholesterol, Total: 231 mg/dL — ABNORMAL HIGH (ref 100–199)
HDL: 84 mg/dL (ref >39)
LDL Chol Calc (NIH): 138 mg/dL — ABNORMAL HIGH (ref 0–99)
Triglycerides: 56 mg/dL (ref 0–149)
VLDL Cholesterol Cal: 9 mg/dL (ref 5–40)

## 2024-03-11 ENCOUNTER — Encounter: Payer: Self-pay | Admitting: Internal Medicine

## 2024-03-11 DIAGNOSIS — R42 Dizziness and giddiness: Secondary | ICD-10-CM

## 2024-03-21 ENCOUNTER — Encounter: Payer: Self-pay | Admitting: Medical-Surgical

## 2024-03-21 ENCOUNTER — Encounter (INDEPENDENT_AMBULATORY_CARE_PROVIDER_SITE_OTHER): Payer: Self-pay | Admitting: Sports Medicine

## 2024-03-21 ENCOUNTER — Telehealth (INDEPENDENT_AMBULATORY_CARE_PROVIDER_SITE_OTHER): Admitting: Medical-Surgical

## 2024-03-21 DIAGNOSIS — M51362 Other intervertebral disc degeneration, lumbar region with discogenic back pain and lower extremity pain: Secondary | ICD-10-CM

## 2024-03-21 DIAGNOSIS — G8929 Other chronic pain: Secondary | ICD-10-CM | POA: Diagnosis not present

## 2024-03-21 DIAGNOSIS — M5442 Lumbago with sciatica, left side: Secondary | ICD-10-CM | POA: Diagnosis not present

## 2024-03-21 DIAGNOSIS — M47816 Spondylosis without myelopathy or radiculopathy, lumbar region: Secondary | ICD-10-CM

## 2024-03-21 MED ORDER — OXYCODONE-ACETAMINOPHEN 5-325 MG PO TABS: 1.0000 | ORAL_TABLET | Freq: Four times a day (QID) | ORAL | 0 refills | Status: DC | PRN

## 2024-03-21 MED ORDER — PREDNISONE 50 MG PO TABS: 50.0000 mg | ORAL_TABLET | Freq: Every day | ORAL | 0 refills | Status: DC

## 2024-03-21 MED ORDER — CYCLOBENZAPRINE HCL 10 MG PO TABS: 5.0000 mg | ORAL_TABLET | Freq: Three times a day (TID) | ORAL | 0 refills | Status: DC | PRN

## 2024-03-21 NOTE — Progress Notes (Signed)
 Virtual Visit via Video Note  I connected with Tonya Hart on 03/21/24 at 10:10 AM EDT by a video enabled telemedicine application and verified that I am speaking with the correct person using two identifiers.   I discussed the limitations of evaluation and management by telemedicine and the availability of in person appointments. The patient expressed understanding and agreed to proceed.  Patient location: home Provider locations: office  Subjective:    CC: Back pain   HPI: Pleasant 33 year old female presenting via MyChart video visit with complaints of severe back pain that started yesterday evening.  Her low back is extremely painful and there is pain/numbness/tingling radiating through the left hip to the mid to lower left thigh.  Notes that she has electric-like shocks that go down to her foot at times but this is not continuous.  Having difficulty with walking and cannot get comfortable no matter what her position is.  Having muscle cramps in her left foot and notes that it locked up on her and she could not get the cramp to let go for approximate 30 minutes.  Has tried ibuprofen , heat, ice, and topical analgesics without relief of symptoms.  She is following up with Dr. Sandy Crumb regarding her low back pain and they have been working on interventional options.  Does not recall any unusual activity or injury yesterday.   Past medical history, Surgical history, Family history not pertinant except as noted below, Social history, Allergies, and medications have been entered into the medical record, reviewed, and corrections made.   Review of Systems: See HPI for pertinent positives and negatives.   Objective:    General: Speaking clearly in complete sentences without any shortness of breath.  Alert and oriented x3.  Normal judgment.  Grimacing, restless during the appointment.  Impression and Recommendations:    1. Chronic bilateral low back pain with left-sided sciatica  (Primary) Has chronic bilateral low back pain that is being worked up and managed by Dr. Sandy Crumb.  She has an acute flare today with severe pain that is interfering with her ability to function normally.  Discussed recommendations for management.  She is open to anything that will help.  Sending in prednisone  50 mg daily.  Adding Flexeril  10 mg 3 times daily as needed.  She is currently breast-feeding but has a supply in the freezer and will pump and dump while taking pain medications.  Sending in a short quantity of Percocet every 6 hours as needed.  - cyclobenzaprine  (FLEXERIL ) 10 MG tablet; Take 0.5-1 tablets (5-10 mg total) by mouth 3 (three) times daily as needed.  Dispense: 30 tablet; Refill: 0 - predniSONE  (DELTASONE ) 50 MG tablet; Take 1 tablet (50 mg total) by mouth daily with breakfast.  Dispense: 5 tablet; Refill: 0  I discussed the assessment and treatment plan with the patient. The patient was provided an opportunity to ask questions and all were answered. The patient agreed with the plan and demonstrated an understanding of the instructions.   The patient was advised to call back or seek an in-person evaluation if the symptoms worsen or if the condition fails to improve as anticipated.  Return if symptoms worsen or fail to improve.  Tonya Snook, DNP, APRN, FNP-BC [facility]

## 2024-03-23 NOTE — Telephone Encounter (Signed)

## 2024-03-24 NOTE — Addendum Note (Signed)
 Addended by: Gean Keels on: 03/24/2024 12:47 PM   Modules accepted: Orders

## 2024-03-27 ENCOUNTER — Other Ambulatory Visit: Payer: Self-pay | Admitting: Medical-Surgical

## 2024-03-27 NOTE — Telephone Encounter (Signed)
 Duplicate request. Completed on 03/21/24.

## 2024-03-28 ENCOUNTER — Encounter: Payer: Self-pay | Admitting: Internal Medicine

## 2024-03-28 ENCOUNTER — Ambulatory Visit (HOSPITAL_COMMUNITY): Attending: Internal Medicine

## 2024-03-28 DIAGNOSIS — R072 Precordial pain: Secondary | ICD-10-CM | POA: Insufficient documentation

## 2024-03-28 DIAGNOSIS — R42 Dizziness and giddiness: Secondary | ICD-10-CM | POA: Insufficient documentation

## 2024-03-28 LAB — ECHOCARDIOGRAM COMPLETE
Area-P 1/2: 5.5 cm2
S' Lateral: 2.75 cm

## 2024-04-07 ENCOUNTER — Ambulatory Visit

## 2024-04-07 DIAGNOSIS — M47816 Spondylosis without myelopathy or radiculopathy, lumbar region: Secondary | ICD-10-CM

## 2024-04-07 DIAGNOSIS — M5136 Other intervertebral disc degeneration, lumbar region with discogenic back pain only: Secondary | ICD-10-CM

## 2024-04-25 DIAGNOSIS — M25512 Pain in left shoulder: Secondary | ICD-10-CM | POA: Diagnosis not present

## 2024-04-28 ENCOUNTER — Ambulatory Visit: Payer: Self-pay | Admitting: Sports Medicine

## 2024-05-15 ENCOUNTER — Ambulatory Visit: Payer: Self-pay

## 2024-05-15 NOTE — Telephone Encounter (Signed)
 FYI Only or Action Required?: Action required by provider: request for appointment.  Patient was last seen in primary care on 03/21/2024 by Tonya Mini, NP. Called Nurse Triage reporting Back Pain. Symptoms began today. Interventions attempted: OTC medications: ibuprofen . Symptoms are: gradually worsening.  Triage Disposition: See HCP Within 4 Hours (Or PCP Triage)  Patient/caregiver understands and will follow disposition?: Yes, will follow disposition  Copied from CRM [id number]. Topic: Clinical - Red Word Triage >> May 15, 2024  3:50 PM Merlynn A wrote: Red Word that prompted transfer to Nurse Triage: Lower back pain, severe pain, leg tingling Reason for Disposition  [1] SEVERE back pain (e.g., excruciating, unable to do any normal activities) AND [2] not improved 2 hours after pain medicine  Answer Assessment - Initial Assessment Questions 1. ONSET: When did the pain begin?      today 2. LOCATION: Where does it hurt? (upper, mid or lower back)     Lower L side 3. SEVERITY: How bad is the pain?  (e.g., Scale 1-10; mild, moderate, or severe)   - MILD (1-3): Doesn't interfere with normal activities.    - MODERATE (4-7): Interferes with normal activities or awakens from sleep.    - SEVERE (8-10): Excruciating pain, unable to do any normal activities.      8 4. PATTERN: Is the pain constant? (e.g., yes, no; constant, intermittent)      constant 5. RADIATION: Does the pain shoot into your legs or somewhere else?     Radiates down L leg to mid thigh 6. CAUSE:  What do you think is causing the back pain?      Hx of back issues 7. BACK OVERUSE:  Any recent lifting of heavy objects, strenuous work or exercise?     Playing with child 8. MEDICINES: What have you taken so far for the pain? (e.g., nothing, acetaminophen , NSAIDS)     Tried rx muscle relaxer 9. NEUROLOGIC SYMPTOMS: Do you have any weakness, numbness, or problems with bowel/bladder control?     L arm is a bit  shaky 10. OTHER SYMPTOMS: Do you have any other symptoms? (e.g., fever, abdomen pain, burning with urination, blood in urine)       denies 11. PREGNANCY: Is there any chance you are pregnant? When was your last menstrual period?       Denies  Pt advised to utilize ED, pt has been seen in clinic for this before. Pt states that she does not have childcare at this time for an ED. Routing HP.  Protocols used: Back Pain-A-AH

## 2024-05-16 NOTE — Telephone Encounter (Signed)
 Spoke with patient - she states the appt on Monday June 30,2025 with Dr. Herlene is for shoulder pain. And that the low back pain is worsening . She states she has 2 children and can not go to ER or urgent care with her children. States she has pain when lifting them as well.  She is waiting to hear back from radiology regarding scheduling the facet injections.  I spoke with Dr. Curtis - he is recommending continuing with the Flexeril  and ibuprofen  until can see Dr. Herlene on Monday. He states that she should ask Dr. Herlene to address the low back pain at that visit as well. Patient informed as above.

## 2024-05-19 ENCOUNTER — Ambulatory Visit: Admitting: Surgical

## 2024-05-19 ENCOUNTER — Other Ambulatory Visit (INDEPENDENT_AMBULATORY_CARE_PROVIDER_SITE_OTHER): Payer: Self-pay

## 2024-05-19 DIAGNOSIS — M25512 Pain in left shoulder: Secondary | ICD-10-CM

## 2024-05-19 DIAGNOSIS — R29898 Other symptoms and signs involving the musculoskeletal system: Secondary | ICD-10-CM | POA: Diagnosis not present

## 2024-05-24 ENCOUNTER — Encounter: Payer: Self-pay | Admitting: Surgical

## 2024-05-24 MED ORDER — ACETAMINOPHEN-CODEINE 300-30 MG PO TABS: 1.0000 | ORAL_TABLET | Freq: Every evening | ORAL | 0 refills | Status: DC | PRN

## 2024-05-24 NOTE — Progress Notes (Signed)
 Office Visit Note   Patient: Tonya Hart           Date of Birth: 1991/03/29           MRN: [id number] Visit Date: 05/19/2024 Requested by: [name], NP [address] [place],  [place] [address] PCP: [name], NP  Subjective: Chief Complaint  Patient presents with   Left Shoulder - Pain   Lower Back - Pain    HPI: Tonya Hart is a 33 y.o. female who presents to the office reporting left shoulder pain and instability.  Patient has history of left shoulder arthroscopy with labral repair on 08/11/2021 performed by Dr. Addie.  She did well with this procedure and was not having any recurrent shoulder instability until 04/25/2024 when she rolled over in bed and pushed herself up with her left arm.  She felt her shoulder dislocate and it was out for a few minutes by her estimation before it reduced itself.  Since then she has had fairly constant pain in the shoulder with occasional numbness and tingling in her left arm if she lifts something.  She has difficulty laying on the left side.  Has associated numbness and tingling and trapezius pain as well as scapular pain.  She has history of fibromyalgia.  Taking ibuprofen  and Voltaren gel.  Also using Flexeril .  She feels a lot of new popping sensation in the shoulder and feels like it is consistently subluxing in and out of the joint.  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  Shoulder instability is making it very difficult for her to fill her duties taking care of her kids.  She also complains of low back pain and left leg radicular pain.  She feels like she threw her back out on Friday and has had increased pain since then.  She has been seen Dr. Curtis for evaluation of her back and recently had MRI scan.  She had discussed the possibility of trying facet joint injections and was referred for these.  She states that she has had continual low back pain and left leg radicular pain that reaches to her  left foot at times.  She has had epidural steroid injections, tried physical therapy, diet change, medications without any relief really for any amount of time of her symptoms.  She feels weakness both in her left arm and her left leg..                ROS: All systems reviewed are negative as they relate to the chief complaint within the history of present illness.  Patient denies fevers or chills.  Assessment & Plan: Visit Diagnoses:  1. Acute pain of left shoulder     Plan: Patient is a 33 year old female who presents for evaluation of left shoulder instability.  She has history of labral repair that was doing well for her for about 3 years until she pushed herself up with 1 arm.  She has had about a month of persistent subluxation symptoms and increased pain in the shoulder.  Radiographs are negative for any significant changes.  Does have reproducible apprehension on exam.  Plan for MRI arthrogram of the left shoulder to further evaluate left shoulder instability.  Regarding her low back pain, she has low back pain and left leg radicular pain.  Recently had MRI of the lumbar spine with findings not really severe enough to explain her symptoms in terms of the  radicular symptoms she has.  She has had similar complaint several years ago with several spine MRIs that were negative to explain the symptoms at that time.  She has tried physical therapy, diet change, medications, injections without relief of these symptoms.  She initially was open to trying facet joint injections but after her experience with epidural steroid injections, wants to avoid these.  Think that with the combined radicular pain and weakness in her left leg along with the unusual numbness and tingling that she has intermittently through the left arm as well as weakness in the left arm that does not really correlate with shoulder instability, would recommend evaluation by neurology.  Patient agreed with plan.  Prescribed Tylenol  No. 3  just to take at night to help with sleeping due to her shoulder pain.  Follow-up after MRI.  Follow-Up Instructions: Return for Review CT/MRI scan.   Orders:  Orders Placed This Encounter  Procedures   XR Shoulder Left   No orders of the defined types were placed in this encounter.     Procedures: No procedures performed   Clinical Data: No additional findings.  Objective: Vital Signs: There were no vitals taken for this visit.  Physical Exam:  Constitutional: Patient appears well-developed HEENT:  Head: Normocephalic Eyes:EOM are normal Neck: Normal range of motion Cardiovascular: Normal rate Pulmonary/chest: Effort normal Neurologic: Patient is alert Skin: Skin is warm Psychiatric: Patient has normal mood and affect  Ortho Exam: Ortho exam demonstrates left shoulder with 40 degrees external rotation, 90 degrees abduction, 90 degrees forward elevation passively and actively.  There is probably more range of motion with the left shoulder but it is very apprehensive for her to do any range of motion past this because it feels like it causes her shoulder to sublux.  Right shoulder has 40 degrees X rotation, 110 degrees abduction, 175 degrees forward elevation passively.  She has axillary nerve intact with deltoid firing bilaterally.  She does have infraspinatus weakness of the left shoulder rated 4/5 relative to 5/5 strength of all upper extremity muscles in the right arm.  She has grip strength, pronation, supination weakness rated 4/5 as well of the left arm.  Positive apprehension sign with positive relocation sign.  No cellulitis or skin changes noted in the left shoulder region.  Her moderate tenderness through the axial lumbar spine and left-sided paraspinal musculature.  She has no pain with hip range of motion.  4/5 strength of hip flexion, quadricep, hamstring, dorsiflexion, plantarflexion of the left lower extremity relative to 5/5 strength throughout the right lower  extremity.  No clonus noted bilaterally.  Specialty Comments:  No specialty comments available.  Imaging: No results found.   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   Past Medical History:  Diagnosis Date   Anxiety and depression    no current medications   Asthma    Complication of anesthesia    Endometriosis    Environmental and seasonal allergies    Fibromyalgia    Gestational hypertension    Headache    PCOS (polycystic ovarian syndrome)    PONV (postoperative nausea and vomiting)  Seasonal allergies     Family History  Problem Relation Age of Onset   Thyroid  disease Mother    Hyperlipidemia Mother    Hypertension Mother    Cataracts Mother    Hyperlipidemia Father    COPD Father    Heart attack Father    Hypertension Father    Diabetes Father    Prostate cancer Maternal Grandfather     Past Surgical History:  Procedure Laterality Date   LAPAROSCOPIC BILATERAL SALPINGECTOMY Bilateral 01/07/2024   Procedure: LAPAROSCOPIC BILATERAL SALPINGECTOMY;  Surgeon: Erik Kieth BROCKS, MD;  Location: [facility] OR;  Service: Gynecology;  Laterality: Bilateral;   SHOULDER ARTHROSCOPY WITH LABRAL REPAIR Left 08/11/2021   Procedure: LEFT SHOULDER ARTHROSCOPY WITH LABRAL REPAIR;  Surgeon: Addie Cordella Hamilton, MD;  Location: [facility] OR;  Service: Orthopedics;  Laterality: Left;   TONSILLECTOMY AND ADENOIDECTOMY  2013   Social History   Occupational History   Occupation: ISS    Employer: [employer]  Tobacco Use   Smoking status: Former    Current packs/day: 0.00    Types:  Cigarettes, E-cigarettes    Quit date: 2019    Years since quitting: 6.5   Smokeless tobacco: Never  Vaping Use   Vaping status: Every Day   Last attempt to quit: 08/04/2020   Substances: Nicotine, THC  Substance and Sexual Activity   Alcohol use: Not Currently    Comment: 3 drinks/week, beer or liquor   Drug use: Yes    Types: Marijuana    Comment: Smokes Marijuana Daily   Sexual activity: Not Currently    Partners: Male    Birth control/protection: None

## 2024-05-26 NOTE — Addendum Note (Signed)
 Addended by: TRINDA DEANE HERO on: 05/26/2024 01:22 PM   Modules accepted: Orders

## 2024-05-26 NOTE — Progress Notes (Signed)
 Orders/referral entered.

## 2024-05-29 ENCOUNTER — Encounter: Payer: Self-pay | Admitting: Surgical

## 2024-06-03 ENCOUNTER — Encounter: Payer: Self-pay | Admitting: Surgical

## 2024-06-12 ENCOUNTER — Inpatient Hospital Stay
Admission: RE | Admit: 2024-06-12 | Discharge: 2024-06-12 | Disposition: A | Discharge status: Home / Self Care | Source: Ambulatory Visit | Attending: Surgical

## 2024-06-12 ENCOUNTER — Inpatient Hospital Stay
Admission: RE | Admit: 2024-06-12 | Discharge: 2024-06-12 | Discharge status: Home / Self Care | Source: Ambulatory Visit | Attending: Surgical

## 2024-06-12 DIAGNOSIS — M25512 Pain in left shoulder: Secondary | ICD-10-CM | POA: Diagnosis not present

## 2024-06-12 MED ORDER — IOPAMIDOL (ISOVUE-M 200) INJECTION 41%
10.0000 mL | Freq: Once | INTRAMUSCULAR | Status: AC
  Administered 2024-06-12: 10 mL via INTRA_ARTICULAR

## 2024-06-19 ENCOUNTER — Ambulatory Visit (INDEPENDENT_AMBULATORY_CARE_PROVIDER_SITE_OTHER): Admitting: Medical-Surgical

## 2024-06-19 ENCOUNTER — Encounter: Payer: Self-pay | Admitting: Medical-Surgical

## 2024-06-19 ENCOUNTER — Ambulatory Visit

## 2024-06-19 VITALS — BP 106/69 | HR 82 | Resp 20 | Ht 68.0 in | Wt 209.3 lb

## 2024-06-19 DIAGNOSIS — J40 Bronchitis, not specified as acute or chronic: Secondary | ICD-10-CM | POA: Diagnosis not present

## 2024-06-19 DIAGNOSIS — J329 Chronic sinusitis, unspecified: Secondary | ICD-10-CM

## 2024-06-19 DIAGNOSIS — R0602 Shortness of breath: Secondary | ICD-10-CM | POA: Diagnosis not present

## 2024-06-19 DIAGNOSIS — R0789 Other chest pain: Secondary | ICD-10-CM | POA: Diagnosis not present

## 2024-06-19 DIAGNOSIS — R059 Cough, unspecified: Secondary | ICD-10-CM | POA: Diagnosis not present

## 2024-06-19 DIAGNOSIS — R079 Chest pain, unspecified: Secondary | ICD-10-CM | POA: Diagnosis not present

## 2024-06-19 MED ORDER — PROMETHAZINE-DM 6.25-15 MG/5ML PO SYRP: 5.0000 mL | ORAL_SOLUTION | Freq: Four times a day (QID) | ORAL | 0 refills | Status: DC | PRN

## 2024-06-19 MED ORDER — PREDNISONE 50 MG PO TABS: 50.0000 mg | ORAL_TABLET | Freq: Every day | ORAL | 0 refills | Status: DC

## 2024-06-19 MED ORDER — CEFDINIR 300 MG PO CAPS: 300.0000 mg | ORAL_CAPSULE | Freq: Two times a day (BID) | ORAL | 0 refills | Status: DC

## 2024-06-19 NOTE — Progress Notes (Signed)
        Established patient visit  History, exam, impression, and plan:  1. Sinobronchitis (Primary) Very pleasant 33 year old female presenting today with reports of upper respiratory symptoms and coughing.  She noted that she started feeling excessive fatigue and a feeling of being off about a week ago.  This persisted until 2 days ago when she developed a severe cough productive of brown sputum, chest discomfort due to frequency and strength of coughing, shortness of breath, intermittent wheezing, swollen right sided cervical lymph nodes, sore throat, hot/cold flashes, and sinus congestion with headache and head pressure.  No known sick contacts however she does have 2 small children and frequently shops at grocery stores.  On evaluation, bilateral TMs obscured by cerumen impaction.  Severe tenderness to the right frontal and maxillary sinuses.  Also has exquisite tenderness to light palpation of cervical lymph nodes, specifically on the right side.  Oropharyngeal erythema without exudate noted.  Lungs CTA however tight, no wheezing.  HRRR, S1/S2 normal.  As she has had approximately 7 days of symptoms from the initial onset, treating for sinobronchitis with cefdinir  3 mg twice daily.  Promethazine  DM 4 times daily as needed, and prednisone  50 mg daily x 5 days.  Getting chest x-ray today.  Continue conservative measures and infection prevention.  If not better by the end of the medication course, return for further evaluation. - promethazine -dextromethorphan (PROMETHAZINE -DM) 6.25-15 MG/5ML syrup; Take 5 mLs by mouth 4 (four) times daily as needed.  Dispense: 118 mL; Refill: 0 - DG Chest 2 View; Future - predniSONE  (DELTASONE ) 50 MG tablet; Take 1 tablet (50 mg total) by mouth daily with breakfast.  Dispense: 5 tablet; Refill: 0 - cefdinir  (OMNICEF ) 300 MG capsule; Take 1 capsule (300 mg total) by mouth 2 (two) times daily.  Dispense: 14 capsule; Refill: 0   Procedures performed this  visit: None.  Return if symptoms worsen or fail to improve.  __________________________________ Zada FREDRIK Palin, DNP, APRN, FNP-BC Primary Care and Sports Medicine [facility]

## 2024-06-19 NOTE — Patient Instructions (Signed)
 Medications & Home Remedies for Upper Respiratory Illness   Aches/Pains, Fever, Headache OTC Acetaminophen (Tylenol) 500 mg tablets - take max 2 tablets (1000 mg) every 6 hours (4 times per day)  OTC Ibuprofen (Motrin) 200 mg tablets - take max 4 tablets (800 mg) every 6 hours*   Sinus Congestion Prescription Atrovent as directed OTC Nasal Saline if desired to rinse OTC Oxymetolazone (Afrin, others) sparing use due to rebound congestion, NEVER use in kids OTC Phenylephrine (Sudafed) 10 mg tablets every 4 hours (or the 12-hour formulation)* OTC Diphenhydramine (Benadryl) 25 mg tablets - take max 2 tablets every 4 hours   Cough & Sore Throat Prescription cough pills or syrups as directed OTC Dextromethorphan (Robitussin, others) - cough suppressant OTC Guaifenesin (Robitussin, Mucinex, others) - expectorant (helps cough up mucus) (Dextromethorphan and Guaifenesin also come in a combination tablet/syrup) OTC Lozenges w/ Benzocaine + Menthol (Cepacol) Honey - as much as you want! Teas which "coat the throat" - look for ingredients Elm Bark, Licorice Root, Marshmallow Root   Other Prescription Oral Steroids to decrease inflammation and improve energy Prescription Antibiotics if these are necessary for bacterial infection - take ALL, even if you're feeling better  OTC Zinc Lozenges within 24 hours of symptoms onset - mixed evidence this shortens the duration of the common cold Don't waste your money on Vitamin C or Echinacea in acute illness - it's already too late!    *Caution in patients with high blood pressure

## 2024-06-27 ENCOUNTER — Ambulatory Visit: Payer: Self-pay | Admitting: Medical-Surgical

## 2024-07-04 ENCOUNTER — Ambulatory Visit: Admitting: Surgical

## 2024-07-04 ENCOUNTER — Other Ambulatory Visit: Payer: Self-pay

## 2024-07-04 DIAGNOSIS — S43002D Unspecified subluxation of left shoulder joint, subsequent encounter: Secondary | ICD-10-CM

## 2024-07-04 DIAGNOSIS — M25512 Pain in left shoulder: Secondary | ICD-10-CM | POA: Diagnosis not present

## 2024-07-05 ENCOUNTER — Encounter: Payer: Self-pay | Admitting: Surgical

## 2024-07-05 NOTE — Progress Notes (Signed)
 Office Visit Note   Patient: Lorien Shingler           Date of Birth: 06-08-1991           MRN: [id number] Visit Date: 07/04/2024 Requested by: Tonya Mini, NP [address] [place],  [place] [address] PCP: Tonya Mini, NP  Subjective: Chief Complaint  Patient presents with   Other    Follow up to review MRI left shoulder    HPI: Tonya Hart is a 33 y.o. female who presents to the office for MRI review. Patient denies any changes in symptoms.  Continues to complain mainly of severe left shoulder pain that is worse with any attempted overhead motion of the shoulder.  She has constant feelings of subluxation.    MRI results revealed: MR Shoulder Left w/ contrast Result Date: 06/12/2024 MR SHOULDER WITH IV CONTRAST LEFT COMPARISON: 06/13/2021 CLINICAL HISTORY: Shoulder instability. PULSE SEQUENCES: Ax PD FS, Sag T2 FS, Cor T1 & COR T2 FS following intra-articular dilute gadolinium contrast placement. FINDINGS: Bones: There is no fracture or marrow edema. No evidence of an acute dislocation. No significant Hill-Sachs deformity is identified. AC joint is unremarkable. Rotator cuff: Supraspinatus, infraspinatus, subscapularis and teres minor tendons are unremarkable. No significant rotator cuff pathology. Labrum and biceps tendon: Biceps tendon is normal. There is slight fraying and mild irregularity to the anterior inferior labrum with slight fissuring of the anterior inferior glenoid cartilage. This is likely post operative in nature. Osseous suture anchors are seen in the anterior inferior glenoid. No obvious displaced labral tear is seen. The superior and posterior labrum are normal. IMPRESSION: Postoperative changes in the anterior inferior labrum. No evidence of a significant recurrent tear. Rotator cuff is intact. No extravasation of contrast. No evidence of edema to suggest an acute dislocation. Electronically signed by: Tonya Hart 06/12/2024 04:14 PM EDT RP  Workstation: [id number]                 ROS: All systems reviewed are negative as they relate to the chief complaint within the history of present illness.  Patient denies fevers or chills.  Assessment & Plan: Visit Diagnoses:  1. Acquired subluxation of left shoulder, subsequent encounter   2. Acute pain of left shoulder     Plan: Tonya Hart is a 33 y.o. female who presents to the office for review of left shoulder MRI.  MRI demonstrates really no recurrence of labral pathology or HAGL lesion despite her subjective instability that she experiences and objective increased anterior instability noted on exam.  With lack of any objective pathology noted on MRI, we discussed options available to patient.  She keeps up with regular home exercise program pretty much daily consisting of rotator cuff strengthening exercises and shoulder stretching due to her history of fibromyalgia.  Based on the exercises we discussed that she does today, do not think formal physical therapy would really be helpful for her.  Did proceed with diagnostic Marcaine  glenohumeral injection in order to rule out myofascial versus fibromyalgia causing some of her symptoms.  She had near 100% relief of her symptoms and had pretty much full range of motion of her shoulder without pain or apprehension aside from continued apprehension with testing her glenohumeral stability with anterior directed force.  May need to consider diagnostic arthroscopy and further intervention but first we will have her follow-up with Dr. Addie for his opinion.  Follow-Up Instructions: No follow-ups on file.   Orders:  Orders Placed This Encounter  Procedures   US  Guided Needle Placement - No Linked Charges   No orders of the defined types were placed in this encounter.     Procedures: Large Joint Inj: L glenohumeral on 07/04/2024 8:55 AM Indications: pain and diagnostic evaluation Details: 22 G 3.5 in needle, ultrasound-guided posterior  approach Medications: 5 mL lidocaine  1 %; 9 mL bupivacaine  0.25 % Outcome: tolerated well, no immediate complications Procedure, treatment alternatives, risks and benefits explained, specific risks discussed. Consent was given by the patient. Immediately prior to procedure a time out was called to verify the correct patient, procedure, equipment, support staff and site/side marked as required. Patient was prepped and draped in the usual sterile fashion.       Clinical Data: No additional findings.  Objective: Vital Signs: There were no vitals taken for this visit.  Physical Exam:  Constitutional: Patient appears well-developed HEENT:  Head: Normocephalic Eyes:EOM are normal Neck: Normal range of motion Cardiovascular: Normal rate Pulmonary/chest: Effort normal Neurologic: Patient is alert Skin: Skin is warm Psychiatric: Patient has normal mood and affect  Ortho Exam: Ortho exam demonstrates left shoulder with 45 degrees external rotation, 100 degrees abduction, 170 degrees forward elevation passively and actively.  This measurement is only obtainable after the diagnostic Marcaine  injection and prior to this injection, she would not tolerate any range of motion of the left shoulder more than about 30 degrees X rotation, 50 degrees abduction, 70 degrees forward elevation.  She has excellent rotator cuff strength of supra, infra, subscap rated 5/5.  Intact EPL, FPL, finger abduction, pronation/supination, bicep, tricep, deltoid.  Axillary nerve intact with deltoid firing.  Intact trapezius shrug.  No tenderness over the [facility] joint.  Does have tenderness over the bicipital groove prior to diagnostic injection but this improves after Marcaine  injection.  Specialty Comments:  No specialty comments available.  Imaging: No results found.   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/2021   Past Medical History:  Diagnosis Date   Anxiety and depression    no current medications   Asthma    Complication of anesthesia    Endometriosis    Environmental and seasonal allergies    Fibromyalgia    Gestational hypertension    Headache    PCOS (polycystic ovarian syndrome)    PONV (postoperative nausea and vomiting)    Seasonal allergies     Family History  Problem Relation Age of Onset   Thyroid  disease Mother    Hyperlipidemia Mother    Hypertension Mother    Cataracts Mother    Hyperlipidemia Father    COPD Father    Heart attack Father    Hypertension Father    Diabetes Father    Prostate cancer Maternal Grandfather     Past Surgical History:  Procedure Laterality Date   LAPAROSCOPIC BILATERAL SALPINGECTOMY Bilateral 01/07/2024   Procedure: LAPAROSCOPIC BILATERAL SALPINGECTOMY;  Surgeon: Erik Kieth BROCKS, Hart;  Location: [facility] OR;  Service: Gynecology;  Laterality: Bilateral;   SHOULDER ARTHROSCOPY WITH LABRAL REPAIR Left 08/11/2021   Procedure: LEFT SHOULDER ARTHROSCOPY WITH LABRAL REPAIR;  Surgeon: Tonya Hart Cordella Hamilton, Hart;  Location[facility]  OR;  Service: Orthopedics;  Laterality: Left;   TONSILLECTOMY AND ADENOIDECTOMY  2013   Social History   Occupational History   Occupation: [employer]    Employer: [employer]  Tobacco Use   Smoking status: Former    Current packs/day: 0.00    Types: Cigarettes, E-cigarettes    Quit date: 2019    Years since quitting: 6.6   Smokeless tobacco: Never  Vaping Use   Vaping status: Every Day   Last attempt to quit: 08/04/2020   Substances: Nicotine, THC  Substance and Sexual Activity   Alcohol use: Not Currently    Comment: 3  drinks/week, beer or liquor   Drug use: Yes    Types: Marijuana    Comment: Smokes Marijuana Daily   Sexual activity: Not Currently    Partners: Male    Birth control/protection: None

## 2024-07-08 NOTE — Telephone Encounter (Signed)
 I called patient.  We discussed her MRI results again.  She is doing okay overall in terms of not feeling substantially worse but still having a lot of left shoulder symptoms.  I will set her up for appointment with Dr. Addie on Friday so he can evaluate her and discuss options.

## 2024-07-11 ENCOUNTER — Encounter: Payer: Self-pay | Admitting: Orthopedic Surgery

## 2024-07-11 ENCOUNTER — Ambulatory Visit: Admitting: Orthopedic Surgery

## 2024-07-11 DIAGNOSIS — R29898 Other symptoms and signs involving the musculoskeletal system: Secondary | ICD-10-CM | POA: Diagnosis not present

## 2024-07-11 DIAGNOSIS — M25512 Pain in left shoulder: Secondary | ICD-10-CM

## 2024-07-11 DIAGNOSIS — S43002D Unspecified subluxation of left shoulder joint, subsequent encounter: Secondary | ICD-10-CM | POA: Diagnosis not present

## 2024-07-11 NOTE — Progress Notes (Signed)
 Office Visit Note   Patient: Tonya Hart           Date of Birth: 05-12-1991           MRN: [id number] Visit Date: 07/11/2024 Requested by: [name], NP [address] [place],  [place] [address] PCP: [name], NP  Subjective: Chief Complaint  Patient presents with   Left Shoulder - Follow-up    HPI: Tonya Hart is a 33 y.o. female who presents to the office reporting left shoulder pain.  At her last clinic visit she had an intra-articular injection in the left shoulder which did give her good relief with full range of motion while she was in the office.  After that she had 2 to 3 days of significant pain.  She had left shoulder stabilization 3 years ago and feels like for the past 2 months her shoulder has been clicking and popping and possibly dislocating much like it did prior to her surgery 3 years ago.  Does radiate up to her neck at times.  MRI scan is reviewed.  Labral repair looks to be in good position.  No Hill-Sachs bruising present.  No definite SLAP tear present.  No haggle lesion.               ROS: All systems reviewed are negative as they relate to the chief complaint within the history of present illness.  Patient denies fevers or chills.  Assessment & Plan: Visit Diagnoses:  1. Acquired subluxation of left shoulder, subsequent encounter   2. Acute pain of left shoulder   3. Left arm weakness     Plan: mpression is possible left shoulder labral pathology versus recurrent instability.  Hard to get a good exam on her today.  Need to try therapy once to twice a week for 6 weeks for range of motion and strengthening.  May consider 6 reevaluation for possible surgical exploration arthroscopically in 6 weeks..    Follow-Up Instructions: Return in about 6 weeks (around 08/22/2024).   Orders:  Orders Placed This Encounter  Procedures   Ambulatory referral to Physical Therapy   No orders of the defined types were placed in this  encounter.     Procedures: No procedures performed   Clinical Data: No additional findings.  Objective: Vital Signs: There were no vitals taken for this visit.  Physical Exam:  Constitutional: Patient appears well-developed HEENT:  Head: Normocephalic Eyes:EOM are normal Neck: Normal range of motion Cardiovascular: Normal rate Pulmonary/chest: Effort normal to text in the 6 instability anteriorly or posteriorly in the shoulder. Neurologic: Patient is alert Skin: Skin is warm Psychiatric: Patient has normal mood and affect  Ortho Exam: Ortho exam demonstrates good deltoid strength.  5 out of 5 grip EPL FPL interosseous wrist flexion extension bicep triceps and deltoid strength.  I am not feeling any popping or grinding with passive range of motion of the shoulder but passive range of motion of the shoulder above 90 degrees is painful for her.  Hard to get an awake exam that  Specialty Comments:  No specialty comments available.  Imaging: No results found.   PMFS History: Patient Active Problem List   Diagnosis Date Noted   Chronic pelvic pain in female 01/07/2024   Request for sterilization 01/07/2024   Lumbar spondylosis 09/10/2023   Marijuana use 09/10/2023   Cigarette nicotine dependence without complication 04/09/2023   History of pregnancy induced hypertension 12/20/2022   Fibromyalgia 12/20/2022  Anxiety 12/20/2022   Recurrent anterior dislocation of shoulder, left 05/13/2021   Past Medical History:  Diagnosis Date   Anxiety and depression    no current medications   Asthma    Complication of anesthesia    Endometriosis    Environmental and seasonal allergies    Fibromyalgia    Gestational hypertension    Headache    PCOS (polycystic ovarian syndrome)    PONV (postoperative nausea and vomiting)    Seasonal allergies     Family History  Problem Relation Age of Onset   Thyroid  disease Mother    Hyperlipidemia Mother    Hypertension Mother     Cataracts Mother    Hyperlipidemia Father    COPD Father    Heart attack Father    Hypertension Father    Diabetes Father    Prostate cancer Maternal Grandfather     Past Surgical History:  Procedure Laterality Date   LAPAROSCOPIC BILATERAL SALPINGECTOMY Bilateral 01/07/2024   Procedure: LAPAROSCOPIC BILATERAL SALPINGECTOMY;  Surgeon: Erik Kieth BROCKS, MD;  Location: [facility] OR;  Service: Gynecology;  Laterality: Bilateral;   SHOULDER ARTHROSCOPY WITH LABRAL REPAIR Left 08/11/2021   Procedure: LEFT SHOULDER ARTHROSCOPY WITH LABRAL REPAIR;  Surgeon: Addie Cordella Hamilton, MD;  Location: [facility] OR;  Service: Orthopedics;  Laterality: Left;   TONSILLECTOMY AND ADENOIDECTOMY  2013   Social History   Occupational History   Occupation: ISS    Employer: [employer]  Tobacco Use   Smoking status: Former    Current packs/day: 0.00    Types: Cigarettes, E-cigarettes    Quit date: 2019    Years since quitting: 6.6   Smokeless tobacco: Never  Vaping Use   Vaping status: Every Day   Last attempt to quit: 08/04/2020   Substances: Nicotine, THC  Substance and Sexual Activity   Alcohol use: Not Currently    Comment: 3 drinks/week, beer or liquor   Drug use: Yes    Types: Marijuana    Comment: Smokes Marijuana Daily   Sexual activity: Not Currently    Partners: Male    Birth control/protection: None

## 2024-07-22 ENCOUNTER — Encounter: Payer: Self-pay | Admitting: Sports Medicine

## 2024-08-10 MED ORDER — LIDOCAINE HCL 1 % IJ SOLN
5.0000 mL | INTRAMUSCULAR | Status: AC | PRN
  Administered 2024-07-04: 5 mL

## 2024-08-10 MED ORDER — BUPIVACAINE HCL 0.25 % IJ SOLN
9.0000 mL | INTRAMUSCULAR | Status: AC | PRN
  Administered 2024-07-04: 9 mL via INTRA_ARTICULAR

## 2024-08-11 ENCOUNTER — Ambulatory Visit: Admitting: Physical Therapy

## 2024-08-11 NOTE — Therapy (Incomplete)
 OUTPATIENT PHYSICAL THERAPY UPPER EXTREMITY EVALUATION   Patient Name: Tonya Hart MRN: [id number] DOB:1991-07-10, 33 y.o., female Today's Date: 08/11/2024  END OF SESSION:   Past Medical History:  Diagnosis Date   Anxiety and depression    no current medications   Asthma    Complication of anesthesia    Endometriosis    Environmental and seasonal allergies    Fibromyalgia    Gestational hypertension    Headache    PCOS (polycystic ovarian syndrome)    PONV (postoperative nausea and vomiting)    Seasonal allergies    Past Surgical History:  Procedure Laterality Date   LAPAROSCOPIC BILATERAL SALPINGECTOMY Bilateral 01/07/2024   Procedure: LAPAROSCOPIC BILATERAL SALPINGECTOMY;  Surgeon: Erik Kieth BROCKS, MD;  Location: [facility] OR;  Service: Gynecology;  Laterality: Bilateral;   SHOULDER ARTHROSCOPY WITH LABRAL REPAIR Left 08/11/2021   Procedure: LEFT SHOULDER ARTHROSCOPY WITH LABRAL REPAIR;  Surgeon: Addie Cordella Hamilton, MD;  Location: [facility] OR;  Service: Orthopedics;  Laterality: Left;   TONSILLECTOMY AND ADENOIDECTOMY  2013   Patient Active Problem List   Diagnosis Date Noted   Chronic pelvic pain in female 01/07/2024   Request for sterilization 01/07/2024   Lumbar spondylosis 09/10/2023   Marijuana use 09/10/2023   Cigarette nicotine dependence without complication 04/09/2023   History of pregnancy induced hypertension 12/20/2022   Fibromyalgia 12/20/2022   Anxiety 12/20/2022   Recurrent anterior dislocation of shoulder, left 05/13/2021    PCP: [name], NP  REFERRING PROVIDER: Addie Cordella Hamilton, MD  REFERRING DIAG:  Diagnosis  S43.002D (ICD-10-CM) - Acquired subluxation of left shoulder, subsequent encounter  M25.512 (ICD-10-CM) - Acute pain of left shoulder  R29.898 (ICD-10-CM) - Left arm weakness    Rationale for Evaluation and Treatment: Rehabilitation  THERAPY DIAG:  No diagnosis found.  PERTINENT HISTORY: Fibromyalgia   WEIGHT BEARING  RESTRICTIONS: {Yes ***/No:24003}  FALLS:  Has patient fallen in last 6 months? {fallsyesno:27318}  LIVING ENVIRONMENT: Lives with: {[facility] lives with:25569::lives with their family} Lives in: {Lives in:25570} Stairs: {opstairs:27293} Has following equipment at home: {Assistive devices:23999}  OCCUPATION: ***   PRECAUTIONS: {Therapy precautions:24002} ---------------------------------------------------------------------------------------------  SUBJECTIVE:                                                                                                                                                                                      SUBJECTIVE STATEMENT: Eval statement 08/11/2024: *** Hand dominance: {MISC; OT HAND [phone]}  RED FLAGS: {PT Red Flags:29287}   PLOF: {PLOF:24004}  PATIENT GOALS: ***  NEXT MD VISIT: *** ---------------------------------------------------------------------------------------------  OBJECTIVE:  Note: Objective measures were completed at Evaluation unless otherwise noted.  DIAGNOSTIC FINDINGS:  ***  PATIENT SURVEYS :  {  rehab surveys:24030:a}  COGNITION: Overall cognitive status: {cognition:24006}     SENSATION: {sensation:27233}  POSTURE: ***  UPPER EXTREMITY ROM:   {AROM/PROM:27142} ROM Right eval Left eval  Shoulder flexion    Shoulder extension    Shoulder abduction    Shoulder adduction    Shoulder internal rotation    Shoulder external rotation    Elbow flexion    Elbow extension    Wrist flexion    Wrist extension    Wrist ulnar deviation    Wrist radial deviation    Wrist pronation    Wrist supination    (Blank rows = not tested)  UPPER EXTREMITY MMT:  MMT Right eval Left eval  Shoulder flexion    Shoulder extension    Shoulder abduction    Shoulder adduction    Shoulder internal rotation    Shoulder external rotation    Middle trapezius    Lower trapezius    Elbow flexion    Elbow  extension    Wrist flexion    Wrist extension    Wrist ulnar deviation    Wrist radial deviation    Wrist pronation    Wrist supination    Grip strength (lbs)    (Blank rows = not tested)  SHOULDER SPECIAL TESTS: Impingement tests: {shoulder impingement test:25231:a} SLAP lesions: {SLAP lesions:25232} Instability tests: {shoulder instability test:25233} Rotator cuff assessment: {rotator cuff assessment:25234} Biceps assessment: {biceps assessment:25235}  JOINT MOBILITY TESTING:  ***  PALPATION:  ***                                             [facility] Adult PT Treatment:                                                DATE: 08/11/2024  Therapeutic Exercise: *** Manual Therapy: *** Neuromuscular re-ed: *** Therapeutic Activity: *** Modalities: *** Self Care: ***   PATIENT EDUCATION: Education details: Pt received education regarding HEP performance, ADL performance, functional activity tolerance, impairment education, appropriate performance of therapeutic activities. Person educated: {Person educated:25204} Education method: {Education Method:25205} Education comprehension: {Education Comprehension:25206}  HOME EXERCISE PROGRAM: *** ---------------------------------------------------------------------------------------------  ASSESSMENT:  CLINICAL IMPRESSION: Eval impression (08/11/2024): Pt. attended today's physical therapy session for evaluation of ***. Pt has complaints of ***. Pt has notable deficits with ***.  Signs and symptoms are concurrent with ***. Pt would benefit from therapeutic focus on ***.  Treatment performed today focused on *** Pt demonstrated *** understanding of education provided. required *** cues and *** assistance for appropriate performance with today's activities.  Pt requires the intervention of skilled outpatient physical therapy to address the aforementioned deficits and progress towards a functional level in line with therapeutic  goals.    OBJECTIVE IMPAIRMENTS: {opptimpairments:25111}.   ACTIVITY LIMITATIONS: {activitylimitations:27494}  PARTICIPATION LIMITATIONS: {participationrestrictions:25113}  PERSONAL FACTORS: {Personal factors:25162} are also affecting patient's functional outcome.   REHAB POTENTIAL: {rehabpotential:25112}  CLINICAL DECISION MAKING: {clinical decision making:25114}  EVALUATION COMPLEXITY: {Evaluation complexity:25115}  GOALS: Goals reviewed with patient? Yes  SHORT TERM GOALS: Target date: ***  Pt will be independent with administered HEP to demonstrate the competency necessary for long term managemnet of symptoms at home.  Baseline: Goal status: {GOALSTATUS:25110}  2.  *** Baseline:  Goal status: {  GOALSTATUS:25110}  3.  *** Baseline:  Goal status: {GOALSTATUS:25110}  4.  *** Baseline:  Goal status: {GOALSTATUS:25110}  5.  *** Baseline:  Goal status: {GOALSTATUS:25110}  6.  *** Baseline:  Goal status: {GOALSTATUS:25110}  LONG TERM GOALS: Target date: ***  Pt. Will achieve a DASH score of *** as to demonstrate improvement in self-perceived functional ability with daily activities.  Baseline:  Goal status: {GOALSTATUS:25110}  2.  Pt will report pain levels improving during ADLs to be less than or equal to ***/10 as to demonstrate improved tolerance with daily functional activities such as ***.  Baseline:  Goal status: {GOALSTATUS:25110}  3.  Pt will improve MMT score for *** to a ***/5 to demonstrate improvement in strength for quality of motion and activity performance.  Baseline:  Goal status: {GOALSTATUS:25110}  4.  *** Baseline:  Goal status: {GOALSTATUS:25110}  5.  *** Baseline:  Goal status: {GOALSTATUS:25110}  6.  *** Baseline:  Goal status: {GOALSTATUS:25110} ---------------------------------------------------------------------------------------------  PLAN: PT FREQUENCY: 1-2x/week  PT DURATION: 6 weeks  PLANNED INTERVENTIONS:  {rehab planned interventions:25118::97110-Therapeutic exercises,97530- Therapeutic [phone]- Neuromuscular re-education,97535- Self Rjmz,02859- Manual therapy,Patient/Family education}  PLAN FOR NEXT SESSION: Review HEP, Begin POC as detailed in assessment   Mabel JONELLE Kiang, PT 08/11/2024, 9:14 AM

## 2024-08-20 ENCOUNTER — Ambulatory Visit: Attending: Orthopedic Surgery

## 2024-08-20 NOTE — Therapy (Deleted)
 OUTPATIENT PHYSICAL THERAPY SHOULDER EVALUATION   Patient Name: Tonya Hart MRN: [id number] DOB:[birth year], 33 y.o., female Today's Date: 08/20/2024  END OF SESSION:   Past Medical History:  Diagnosis Date   Anxiety and depression    no current medications   Asthma    Complication of anesthesia    Endometriosis    Environmental and seasonal allergies    Fibromyalgia    Gestational hypertension    Headache    PCOS (polycystic ovarian syndrome)    PONV (postoperative nausea and vomiting)    Seasonal allergies    Past Surgical History:  Procedure Laterality Date   LAPAROSCOPIC BILATERAL SALPINGECTOMY Bilateral 01/07/2024   Procedure: LAPAROSCOPIC BILATERAL SALPINGECTOMY;  Surgeon: Erik Kieth BROCKS, MD;  Location: [facility] OR;  Service: Gynecology;  Laterality: Bilateral;   SHOULDER ARTHROSCOPY WITH LABRAL REPAIR Left 08/11/2021   Procedure: LEFT SHOULDER ARTHROSCOPY WITH LABRAL REPAIR;  Surgeon: Addie Cordella Hamilton, MD;  Location: [facility] OR;  Service: Orthopedics;  Laterality: Left;   TONSILLECTOMY AND ADENOIDECTOMY  2013   Patient Active Problem List   Diagnosis Date Noted   Chronic pelvic pain in female 01/07/2024   Request for sterilization 01/07/2024   Lumbar spondylosis 09/10/2023   Marijuana use 09/10/2023   Cigarette nicotine dependence without complication 04/09/2023   History of pregnancy induced hypertension 12/20/2022   Fibromyalgia 12/20/2022   Anxiety 12/20/2022   Recurrent anterior dislocation of shoulder, left 05/13/2021    PCP: [name], NP   REFERRING PROVIDER: Addie Cordella Hamilton, MD  REFERRING DIAG: S43.002D (ICD-10-CM) - Acquired subluxation of left shoulder, subsequent encounter M25.512 (ICD-10-CM) - Acute pain of left shoulder R29.898 (ICD-10-CM) - Left arm weakness  THERAPY DIAG:  No diagnosis found.  Rationale for Evaluation and Treatment: Rehabilitation  ONSET DATE: 2 months  SUBJECTIVE:                                                                                                                                                                                       SUBJECTIVE STATEMENT: *** Hand dominance: {MISC; OT HAND [phone]}  PERTINENT HISTORY: Tonya Hart is a 33 y.o. female who presents to the office reporting left shoulder pain.  At her last clinic visit she had an intra-articular injection in the left shoulder which did give her good relief with full range of motion while she was in the office.  After that she had 2 to 3 days of significant pain.  She had left shoulder stabilization 3 years ago and feels like for the past 2 months her shoulder has been clicking and popping and possibly dislocating much like it did prior to her surgery 3 years  ago.  Does radiate up to her neck at times.   MRI scan is reviewed.  Labral repair looks to be in good position.  No Hill-Sachs bruising present.  No definite SLAP tear present.  No haggle lesion.    PAIN:  Are you having pain? Yes: NPRS scale: *** Pain location: *** Pain description: *** Aggravating factors: *** Relieving factors: ***  PRECAUTIONS: Shoulder  RED FLAGS: None   WEIGHT BEARING RESTRICTIONS: No  FALLS:  Has patient fallen in last 6 months? No  OCCUPATION: ***  PLOF: Independent  PATIENT GOALS:To manage my shoulder symptoms  NEXT MD VISIT:   OBJECTIVE:  Note: Objective measures were completed at Evaluation unless otherwise noted.  DIAGNOSTIC FINDINGS:  IMPRESSION: Postoperative changes in the anterior inferior labrum. No evidence of a significant recurrent tear. Rotator cuff is intact. No extravasation of contrast.   No evidence of edema to suggest an acute dislocation.   Electronically signed by: Norleen Satchel MD 06/12/2024 04:14 PM EDT RP Workstation: [id number]  PATIENT SURVEYS:  Quick Dash:    Minimally Clinically Important Difference (MCID): 15-20 points  (Franchignoni, F. et al. (2013). Minimally  clinically important difference of the disabilities of the arm, shoulder, and hand outcome measures (DASH) and its shortened version (Quick DASH). Journal of Orthopaedic & Sports Physical Therapy, 44(1), 30-39)    POSTURE: ***  UPPER EXTREMITY ROM:   {AROM/PROM:27142} ROM Right eval Left eval  Shoulder flexion    Shoulder extension    Shoulder abduction    Shoulder adduction    Shoulder internal rotation    Shoulder external rotation    Elbow flexion    Elbow extension    Wrist flexion    Wrist extension    Wrist ulnar deviation    Wrist radial deviation    Wrist pronation    Wrist supination    (Blank rows = not tested)  UPPER EXTREMITY MMT:  MMT Right eval Left eval  Shoulder flexion    Shoulder extension    Shoulder abduction    Shoulder adduction    Shoulder internal rotation    Shoulder external rotation    Middle trapezius    Lower trapezius    Elbow flexion    Elbow extension    Wrist flexion    Wrist extension    Wrist ulnar deviation    Wrist radial deviation    Wrist pronation    Wrist supination    Grip strength (lbs)    (Blank rows = not tested)  SHOULDER SPECIAL TESTS: Impingement tests: {shoulder impingement test:25231:a} SLAP lesions: {SLAP lesions:25232} Instability tests: {shoulder instability test:25233} Rotator cuff assessment: {rotator cuff assessment:25234} Biceps assessment: {biceps assessment:25235}  JOINT MOBILITY TESTING:  ***  PALPATION:  ***                                                                                                                             TREATMENT:  [facility] Adult PT Treatment:  DATE: *** Teval and HEP Self Care: ***   PATIENT EDUCATION: Education details: *** Person educated: {Person educated:25204} Education method: {Education Method:25205} Education comprehension: {Education Comprehension:25206}  HOME EXERCISE  PROGRAM: ***  ASSESSMENT:  CLINICAL IMPRESSION: Patient is a 33 y.o. female who was seen today for physical therapy evaluation and treatment for chronic L shoulder pain.   OBJECTIVE IMPAIRMENTS: {opptimpairments:25111}.   ACTIVITY LIMITATIONS: {activitylimitations:27494}  PERSONAL FACTORS: {Personal factors:25162} are also affecting patient's functional outcome.   REHAB POTENTIAL: Good  CLINICAL DECISION MAKING: Evolving/moderate complexity  EVALUATION COMPLEXITY: Moderate   GOALS: Goals reviewed with patient? No  SHORT TERM GOALS: Target date: ***  Patient to demonstrate independence in HEP  Baseline: Goal status: INITIAL  2.  *** Baseline:  Goal status: INITIAL  3.  *** Baseline:  Goal status: INITIAL  4.  *** Baseline:  Goal status: INITIAL  5.  *** Baseline:  Goal status: INITIAL  6.  *** Baseline:  Goal status: INITIAL  LONG TERM GOALS: Target date: ***  Patient will acknowledge ***/10 pain at least once during episode of care   Baseline:  Goal status: INITIAL  2.  Patient will score at least ***% on FOTO to signify clinically meaningful improvement in functional abilities.   Baseline:  Goal status: INITIAL  3.  *** Baseline:  Goal status: INITIAL  4.  *** Baseline:  Goal status: INITIAL  5.  *** Baseline:  Goal status: INITIAL  6.  *** Baseline:  Goal status: INITIAL  PLAN:  PT FREQUENCY: 1-2x/week  PT DURATION: 6 weeks  PLANNED INTERVENTIONS: 97110-Therapeutic exercises, 97530- Therapeutic activity, 97112- Neuromuscular re-education, 97535- Self Care, 02859- Manual therapy, and Patient/Family education  PLAN FOR NEXT SESSION: HEP review and update, manual techniques as appropriate, aerobic tasks, ROM and flexibility activities, strengthening and PREs, TPDN, gait and balance training as needed     Tonya Hart, PT 08/20/2024, 1:25 PM

## 2024-09-01 ENCOUNTER — Other Ambulatory Visit (HOSPITAL_BASED_OUTPATIENT_CLINIC_OR_DEPARTMENT_OTHER): Payer: Self-pay

## 2024-09-03 ENCOUNTER — Telehealth: Payer: Self-pay | Admitting: *Deleted

## 2024-09-03 DIAGNOSIS — F419 Anxiety disorder, unspecified: Secondary | ICD-10-CM

## 2024-09-03 NOTE — Progress Notes (Signed)
 Complex Care Management Note Care Guide Note  09/03/2024 Name: Tonya Hart MRN: [id number] DOB: 08/25/1991   Complex Care Management Outreach Attempts: An unsuccessful telephone outreach was attempted today to offer the patient information about available complex care management services.  Follow Up Plan:  Additional outreach attempts will be made to offer the patient complex care management information and services.   Encounter Outcome:  No Answer  Thedford Franks, CMA [place]  [facility] [place] Guide Direct Dial: [phone]  Fax: [phone] Website: [email or web address]

## 2024-09-05 ENCOUNTER — Ambulatory Visit: Admitting: Medical-Surgical

## 2024-09-05 ENCOUNTER — Encounter: Payer: Self-pay | Admitting: Medical-Surgical

## 2024-09-05 VITALS — BP 101/67 | HR 84 | Resp 20 | Ht 68.0 in | Wt 202.0 lb

## 2024-09-05 DIAGNOSIS — R42 Dizziness and giddiness: Secondary | ICD-10-CM

## 2024-09-05 DIAGNOSIS — H6123 Impacted cerumen, bilateral: Secondary | ICD-10-CM | POA: Diagnosis not present

## 2024-09-05 DIAGNOSIS — R058 Other specified cough: Secondary | ICD-10-CM | POA: Diagnosis not present

## 2024-09-05 DIAGNOSIS — F419 Anxiety disorder, unspecified: Secondary | ICD-10-CM

## 2024-09-05 DIAGNOSIS — J309 Allergic rhinitis, unspecified: Secondary | ICD-10-CM | POA: Diagnosis not present

## 2024-09-05 MED ORDER — BUSPIRONE HCL 15 MG PO TABS: 15.0000 mg | ORAL_TABLET | Freq: Three times a day (TID) | ORAL | 5 refills | Status: AC

## 2024-09-05 MED ORDER — CETIRIZINE HCL 10 MG PO TABS: 10.0000 mg | ORAL_TABLET | Freq: Every day | ORAL | 11 refills | Status: AC

## 2024-09-05 MED ORDER — CEFDINIR 300 MG PO CAPS: 300.0000 mg | ORAL_CAPSULE | Freq: Two times a day (BID) | ORAL | 0 refills | Status: DC

## 2024-09-05 MED ORDER — FLUTICASONE PROPIONATE 50 MCG/ACT NA SUSP: 2.0000 | Freq: Every day | NASAL | 6 refills | Status: AC

## 2024-09-05 NOTE — Progress Notes (Signed)
 While in the office, patient was provided with a freestyle libre 3+ sensor to allow for immediate access to continuous glucose monitoring for the next 2 weeks in case she is unable to obtain a glucometer for home use.  With continued productive cough over the last 2 weeks and brown mucus, suspect bacterial lower respiratory tract infection.  Treating with Omnicef  twice daily x 7 days.  Medical screening examination/treatment was performed by qualified clinical staff member and as supervising provider I was immediately available for consultation/collaboration. I have reviewed documentation and agree with assessment and plan.  Zada FREDRIK Palin, DNP, APRN, FNP-BC [facility]

## 2024-09-05 NOTE — Progress Notes (Signed)
 Acute Office Visit  Subjective:     Patient ID: Tonya Hart, female    DOB: 10/14/1991, 33 y.o.   MRN: [id number]  Chief Complaint  Patient presents with   Ear Fullness    Bilateral    Diabetes    Family Hx Having bad shaking spells and vomiting and drinks apple juice then she's okay.    Ear Fullness  Associated symptoms include coughing, ear discharge, hearing loss and vomiting.  Diabetes Hypoglycemia symptoms include dizziness and nervousness/anxiousness.   Patient is in today for 2 week history of bilateral ear fullness. She states that she has decreased hearing in her left ear and some pain which she rates a 5/10. Patient states that she has tried over the counter wax softener with minimal relief. She also endorses nasal congestion, runny nose, and productive cough with brown mucus. She has not tried anything over the counter for cold symptoms. Denies fever, chills, or shortness of breath.  Patient also reports having 3-5 episodes per week where she feels dizzy, shaky, nausea, and vomiting. Patient states that symptoms are relieved with drinking apple juice, or a sugary drink. Patient states that she has a significant family history of diabetes in her mother, father, and grandparents. She denies history of gestational diabetes in previous pregnancies. She is still nursing her 31 year old and states that she drinks 2-3 32 ounce jugs of water daily, and eats three full meals with snacks throughout the day that consist of good protein and carbohydrates.  Patient states that she was unable to get her buspar  filled because she was not able to afford the prescription. Patient GAD 7 score today is 11. Patient provided coupon from [employer] to help with cost of prescription.  Review of Systems  Constitutional: Negative.   HENT:  Positive for congestion, ear discharge, ear pain and hearing loss.   Eyes: Negative.   Respiratory:  Positive for cough, hemoptysis and sputum production.  Negative for shortness of breath and wheezing.   Cardiovascular: Negative.   Gastrointestinal:  Positive for nausea and vomiting.  Genitourinary: Negative.   Musculoskeletal: Negative.   Skin: Negative.   Neurological:  Positive for dizziness.  Endo/Heme/Allergies: Negative.   Psychiatric/Behavioral:  The patient is nervous/anxious.        Objective:    BP 101/67 (BP Location: Right Arm, Cuff Size: Normal)   Pulse 84   Resp 20   Ht 5' 8 (1.727 m)   Wt 91.6 kg   SpO2 100%   BMI 30.71 kg/m    Physical Exam Vitals and nursing note reviewed.  Constitutional:      General: She is not in acute distress.    Appearance: Normal appearance.  HENT:     Right Ear: There is impacted cerumen.     Left Ear: There is impacted cerumen.     Nose: Congestion and rhinorrhea present.     Mouth/Throat:     Mouth: Mucous membranes are moist.     Pharynx: Oropharynx is clear.  Eyes:     Conjunctiva/sclera: Conjunctivae normal.     Pupils: Pupils are equal, round, and reactive to light.  Cardiovascular:     Rate and Rhythm: Normal rate and regular rhythm.     Pulses: Normal pulses.     Heart sounds: Normal heart sounds.  Pulmonary:     Effort: Pulmonary effort is normal.     Breath sounds: Normal breath sounds.  Lymphadenopathy:     Cervical: No cervical adenopathy.  Skin:    General: Skin is warm and dry.  Neurological:     General: No focal deficit present.     Mental Status: She is alert and oriented to person, place, and time.  Psychiatric:        Mood and Affect: Mood normal.        Behavior: Behavior normal.        Thought Content: Thought content normal.        Judgment: Judgment normal.     No results found for any visits on 09/05/24.      Assessment & Plan:  1. Productive cough (Primary) -Productive cough w/ brown sputum production x 2 weeks associated with nasal congestion and runny nose -Omnicef  antibiotic sent to the pharmacy - DG Chest 2 View; Future  2.  Bilateral impacted cerumen -Bilateral ear lavage completed in office today with good relief in the left ear -Patient advised to use otc Debrox and return if symptoms does not improve  3. Dizziness -Patient reports having 3-5 episodes per week where she feels dizzy, shaky, nausea, and vomiting. Patient states that symptoms are relieved with drinking apple juice, or a sugary drink -Recommended patient look for over the counter glucometer to start checking blood sugars during these episodes  4. Allergic rhinitis, unspecified seasonality, unspecified trigger -Rx for Cetirizine 10 mg sent to the pharmacy for patient to take daily -Rx for Flonase  sent to pharmacy for patient to spray 2 sprays into each nostril daily  --Referral to asthma and allergy for history of allergies and previous treatment with allergy shots  5. Anxiety -[facility] coupon provided to patient for a cheaper cost -Buspar  rx sent to the pharmacy   Meds ordered this encounter  Medications   busPIRone  (BUSPAR ) 15 MG tablet    Sig: Take 1 tablet (15 mg total) by mouth 3 (three) times daily.    Dispense:  90 tablet    Refill:  5    Supervising Provider:   MATTHEWS, CODY [[id number]]   cefdinir  (OMNICEF ) 300 MG capsule    Sig: Take 1 capsule (300 mg total) by mouth 2 (two) times daily.    Dispense:  14 capsule    Refill:  0    Supervising Provider:   MATTHEWS, CODY [[id number]]   cetirizine (ZYRTEC) 10 MG tablet    Sig: Take 1 tablet (10 mg total) by mouth daily.    Dispense:  30 tablet    Refill:  11    Supervising Provider:   MATTHEWS, CODY [[id number]]   fluticasone  (FLONASE ) 50 MCG/ACT nasal spray    Sig: Place 2 sprays into both nostrils daily.    Dispense:  16 g    Refill:  6    Supervising Provider:   ALVIA, CODY [[id number]]    Return if symptoms worsen or fail to improve.  Derrek JINNY Freund, NP Student

## 2024-09-09 ENCOUNTER — Encounter: Payer: Self-pay | Admitting: Medical-Surgical

## 2024-09-11 NOTE — Progress Notes (Unsigned)
 Complex Care Management Note Care Guide Note  09/11/2024 Name: Tonya Hart MRN: [id number] DOB: 1991/10/08   Complex Care Management Outreach Attempts: A second unsuccessful outreach was attempted today to offer the patient with information about available complex care management services.  Follow Up Plan:  Additional outreach attempts will be made to offer the patient complex care management information and services.   Encounter Outcome:  No Answer  Thedford Franks, CMA [place]  [facility], [facility] Guide Direct Dial: [phone]  Fax: [phone] Website: [email or web address]

## 2024-09-12 NOTE — Progress Notes (Signed)
 Complex Care Management Note Care Guide Note  09/12/2024 Name: Tonya Hart MRN: [id number] DOB: [birth year]   Complex Care Management Outreach Attempts: A third unsuccessful outreach was attempted today to offer the patient with information about available complex care management services.  Follow Up Plan:  No further outreach attempts will be made at this time. We have been unable to contact the patient to offer or enroll patient in complex care management services.  Encounter Outcome:  No Answer  Thedford Franks, CMA [place]  [facility], [facility] Guide Direct Dial: [phone]  Fax: [phone] Website: [email or web address]

## 2024-09-22 ENCOUNTER — Encounter: Payer: Self-pay | Admitting: Radiology

## 2024-09-23 ENCOUNTER — Encounter: Payer: Self-pay | Admitting: Medical-Surgical

## 2024-09-23 ENCOUNTER — Telehealth: Admitting: Medical-Surgical

## 2024-09-23 DIAGNOSIS — E162 Hypoglycemia, unspecified: Secondary | ICD-10-CM | POA: Diagnosis not present

## 2024-09-23 MED ORDER — BLOOD GLUCOSE MONITORING SUPPL DEVI: 1.0000 | 0 refills | Status: AC

## 2024-09-23 MED ORDER — BLOOD GLUCOSE TEST VI STRP: 1.0000 | ORAL_STRIP | 11 refills | Status: AC

## 2024-09-23 MED ORDER — LANCET DEVICE MISC: 1.0000 | 0 refills | Status: AC

## 2024-09-23 MED ORDER — LANCETS MISC: 1.0000 | 11 refills | Status: AC

## 2024-09-23 NOTE — Progress Notes (Signed)
 Virtual Visit via Video Note  I connected with Tonya Hart on 09/23/24 at  8:30 AM EST by a video enabled telemedicine application and verified that I am speaking with the correct person using two identifiers.   I discussed the limitations of evaluation and management by telemedicine and the availability of in person appointments. The patient expressed understanding and agreed to proceed.  Patient location: home Provider locations: office  Subjective:    CC: glucose results  HPI: Pleasant 33 year old female presenting via MyChart video visit to review glucose results after wearing a continuous glucose monitor for 2 weeks.  She notes that she started having low alarms within 10 minutes of placing the monitor and has had multiple alarms per day since then.  Her lowest blood sugar recorded was 59 in which she was symptomatic (dizzy/lightheaded, weak, nauseous, headache, etc.).  When she experiences lows, she will eat a snack which helps to bring her sugars up.  Has noticed low blood sugars occurring an hour after eating a full meal and regardless of activity.  Of note, she is currently still breast-feeding.  Endorses making protein a priority while eating 3 meals and 3 snacks per day.  Past medical history, Surgical history, Family history not pertinant except as noted below, Social history, Allergies, and medications have been entered into the medical record, reviewed, and corrections made.   Review of Systems: See HPI for pertinent positives and negatives.   Objective:    General: Speaking clearly in complete sentences without any shortness of breath.  Alert and oriented x3.  Normal judgment. No apparent acute distress.  Impression and Recommendations:    1. Hypoglycemia (Primary) Unfortunately, feel that her hypoglycemia is directly related to continued breast-feeding and likely lower than optimal protein intake.  She is aiming for protein intake with eggs, meats, peanut butter,  bacon, and protein bars.  Reviewed recommendations for protein intake based on her height and ideal body weight (around 140 g daily).  Would like her to continue working on dietary modification to increase protein and eating regularly.  Prescribing a glucometer for glucose checks if she feels symptomatic.  Suspect her low blood sugars will resolve once she stops breast-feeding completely. - Blood Glucose Monitoring Suppl DEVI; 1 each by Does not apply route as directed. Dispense based on patient and insurance preference. Use up to four times daily as directed. (FOR ICD-10 E10.9, E11.9).  Dispense: 1 each; Refill: 0 - Glucose Blood (BLOOD GLUCOSE TEST STRIPS) STRP; 1 each by Does not apply route as directed. Dispense based on patient and insurance preference. Use up to four times daily as directed. (FOR ICD-10 E10.9, E11.9).  Dispense: 200 strip; Refill: 11 - Lancet Device MISC; 1 each by Does not apply route as directed. Dispense based on patient and insurance preference. Use up to four times daily as directed. (FOR ICD-10 E10.9, E11.9).  Dispense: 1 each; Refill: 0 - Lancets MISC; 1 each by Does not apply route as directed. Dispense based on patient and insurance preference. Use up to four times daily as directed. (FOR ICD-10 E10.9, E11.9).  Dispense: 200 each; Refill: 11   I discussed the assessment and treatment plan with the patient. The patient was provided an opportunity to ask questions and all were answered. The patient agreed with the plan and demonstrated an understanding of the instructions.   The patient was advised to call back or seek an in-person evaluation if the symptoms worsen or if the condition fails to improve as anticipated.  Return if symptoms worsen or fail to improve.  Zada FREDRIK Palin, DNP, APRN, FNP-BC [facility]

## 2024-10-08 ENCOUNTER — Encounter: Payer: Self-pay | Admitting: Neurology

## 2024-10-08 ENCOUNTER — Ambulatory Visit: Admitting: Neurology

## 2024-10-13 ENCOUNTER — Ambulatory Visit: Admitting: Neurology

## 2024-10-13 ENCOUNTER — Encounter: Payer: Self-pay | Admitting: Neurology

## 2024-12-02 ENCOUNTER — Telehealth: Admitting: Medical-Surgical

## 2024-12-02 DIAGNOSIS — G8929 Other chronic pain: Secondary | ICD-10-CM

## 2024-12-02 DIAGNOSIS — R102 Pelvic and perineal pain unspecified side: Secondary | ICD-10-CM | POA: Diagnosis not present

## 2024-12-02 DIAGNOSIS — N920 Excessive and frequent menstruation with regular cycle: Secondary | ICD-10-CM | POA: Insufficient documentation

## 2024-12-02 NOTE — Progress Notes (Signed)
 Virtual Visit via Video Note  I connected with Tonya Hart on 12/02/2024 at  9:50 AM EST by a video enabled telemedicine application and verified that I am speaking with the correct person using two identifiers.   I discussed the limitations of evaluation and management by telemedicine and the availability of in person appointments. The patient expressed understanding and agreed to proceed.  Patient location: home Provider locations: office  Subjective:    Discussed the use of AI scribe software for clinical note transcription with the patient, who gave verbal consent to proceed.  History of Present Illness   Tonya Hart is a 34 year old female who presents with worsening menstrual pain and heavy bleeding.  Dysmenorrhea and menorrhagia/chronic pelvic pain - Worsening severe menstrual pain over the past 3-4 cycles - Pain radiates to back and down legs - Strong pelvic pressure sensation - Heavy menstrual bleeding with large clots and 'clot strings' - Throbbing pain severe enough to prevent sleep for days - Significant pain with bowel movements and urination during menses - Ibuprofen  800mg  TID around the clock provides no relief - Constant pain during periods with frequent nausea and vomiting, often daily  Migraine with visual disturbances - Migraines with visual disturbances at times - Concern for blood clot risk due to combination of migraines with aura and heavy menstrual clotting  Medication intolerance and concerns - Previously tried amitriptyline  but discontinued due to excessive grogginess and feeling spaced out - Takes Lexapro  and Buce for depression - Concerned about potential medication interactions but open to better tolerated treatment options  Past medical history, Surgical history, Family history not pertinant except as noted below, Social history, Allergies, and medications have been entered into the medical record, reviewed, and corrections made.   Review  of Systems: See HPI for pertinent positives and negatives.   Objective:    General: Speaking clearly in complete sentences without any shortness of breath.  Alert and oriented x3.  Normal judgment. No apparent acute distress.  Impression and Recommendations:    Menorrhagia with dysmenorrhea/chronic pelvic pain Severe menorrhagia with dysmenorrhea, worsening over recent cycles. Hormonal birth control contraindicated due to migraines with aura and nicotine use. Nortriptyline considered due to better tolerance. - Ordered pelvic ultrasound to evaluate for fibroids or other abnormalities. - Continue ibuprofen  for pain management until ultrasound results are available. - Consider starting nortriptyline at 10 mg if ultrasound is unremarkable, with potential increase to 25 mg if tolerated.  -Consider Lysteda if nortriptyline is not well-tolerated. - Follow-up with OB/GYN as scheduled.    I discussed the assessment and treatment plan with the patient. The patient was provided an opportunity to ask questions and all were answered. The patient agreed with the plan and demonstrated an understanding of the instructions.   The patient was advised to call back or seek an in-person evaluation if the symptoms worsen or if the condition fails to improve as anticipated.  Return if symptoms worsen or fail to improve.  Tonya FREDRIK Palin, DNP, APRN, FNP-BC [facility] and [facility]

## 2024-12-09 ENCOUNTER — Ambulatory Visit: Admitting: Neurology

## 2024-12-09 ENCOUNTER — Encounter: Payer: Self-pay | Admitting: Neurology

## 2024-12-12 ENCOUNTER — Other Ambulatory Visit

## 2024-12-18 ENCOUNTER — Other Ambulatory Visit
